# Patient Record
Sex: Male | Born: 1945
Health system: Southern US, Community
[De-identification: ages and names within clinical notes are randomized; demographics above are authoritative.]

## PROBLEM LIST (undated history)

## (undated) DIAGNOSIS — M722 Plantar fascial fibromatosis: Secondary | ICD-10-CM

## (undated) DIAGNOSIS — N201 Calculus of ureter: Secondary | ICD-10-CM

## (undated) DIAGNOSIS — Z531 Procedure and treatment not carried out because of patient's decision for reasons of belief and group pressure: Secondary | ICD-10-CM

## (undated) DIAGNOSIS — N1330 Unspecified hydronephrosis: Secondary | ICD-10-CM

## (undated) DIAGNOSIS — Z87442 Personal history of urinary calculi: Secondary | ICD-10-CM

## (undated) DIAGNOSIS — G43909 Migraine, unspecified, not intractable, without status migrainosus: Secondary | ICD-10-CM

## (undated) DIAGNOSIS — J9383 Other pneumothorax: Secondary | ICD-10-CM

## (undated) DIAGNOSIS — N134 Hydroureter: Secondary | ICD-10-CM

## (undated) DIAGNOSIS — C4440 Unspecified malignant neoplasm of skin of scalp and neck: Secondary | ICD-10-CM

## (undated) DIAGNOSIS — E781 Pure hyperglyceridemia: Secondary | ICD-10-CM

## (undated) DIAGNOSIS — K219 Gastro-esophageal reflux disease without esophagitis: Secondary | ICD-10-CM

## (undated) DIAGNOSIS — H0019 Chalazion unspecified eye, unspecified eyelid: Secondary | ICD-10-CM

## (undated) DIAGNOSIS — Z98811 Dental restoration status: Secondary | ICD-10-CM

## (undated) DIAGNOSIS — R7301 Impaired fasting glucose: Secondary | ICD-10-CM

## (undated) DIAGNOSIS — Z9889 Other specified postprocedural states: Secondary | ICD-10-CM

## (undated) DIAGNOSIS — K625 Hemorrhage of anus and rectum: Secondary | ICD-10-CM

## (undated) DIAGNOSIS — Z973 Presence of spectacles and contact lenses: Secondary | ICD-10-CM

## (undated) DIAGNOSIS — R112 Nausea with vomiting, unspecified: Secondary | ICD-10-CM

## (undated) HISTORY — DX: Other pneumothorax: J93.83

## (undated) HISTORY — DX: Pure hyperglyceridemia: E78.1

## (undated) HISTORY — DX: Impaired fasting glucose: R73.01

## (undated) HISTORY — DX: Plantar fascial fibromatosis: M72.2

## (undated) HISTORY — DX: Hemorrhage of anus and rectum: K62.5

## (undated) HISTORY — PX: CHALAZION EXCISION: SHX213

## (undated) HISTORY — DX: Unspecified malignant neoplasm of skin of scalp and neck: C44.40

## (undated) HISTORY — PX: COLONOSCOPY: SHX174

## (undated) HISTORY — PX: LUNG SURGERY: SHX703

## (undated) HISTORY — DX: Chalazion unspecified eye, unspecified eyelid: H00.19

## (undated) HISTORY — PX: TONSILLECTOMY: SUR1361

---

## 2000-03-12 DIAGNOSIS — K625 Hemorrhage of anus and rectum: Secondary | ICD-10-CM

## 2000-03-12 HISTORY — DX: Hemorrhage of anus and rectum: K62.5

## 2000-03-12 HISTORY — PX: HEMORRHOID SURGERY: SHX153

## 2001-02-25 ENCOUNTER — Encounter: Payer: Self-pay | Admitting: General Surgery

## 2001-02-27 ENCOUNTER — Ambulatory Visit (HOSPITAL_COMMUNITY): Admission: RE | Admit: 2001-02-27 | Discharge: 2001-02-27 | Payer: Self-pay | Admitting: General Surgery

## 2001-03-12 DIAGNOSIS — M722 Plantar fascial fibromatosis: Secondary | ICD-10-CM

## 2001-03-12 HISTORY — DX: Plantar fascial fibromatosis: M72.2

## 2003-03-13 HISTORY — PX: INGUINAL HERNIA REPAIR: SUR1180

## 2009-03-12 DIAGNOSIS — C4440 Unspecified malignant neoplasm of skin of scalp and neck: Secondary | ICD-10-CM

## 2009-03-12 HISTORY — DX: Unspecified malignant neoplasm of skin of scalp and neck: C44.40

## 2013-09-23 ENCOUNTER — Encounter: Payer: Self-pay | Admitting: *Deleted

## 2017-10-25 ENCOUNTER — Other Ambulatory Visit: Payer: Self-pay

## 2017-10-25 ENCOUNTER — Encounter: Payer: Self-pay | Admitting: Radiology

## 2017-10-25 ENCOUNTER — Other Ambulatory Visit: Payer: Self-pay | Admitting: Nurse Practitioner

## 2017-10-25 ENCOUNTER — Ambulatory Visit
Admission: RE | Admit: 2017-10-25 | Discharge: 2017-10-25 | Disposition: A | Discharge status: Home / Self Care | Payer: Medicare Other | Source: Ambulatory Visit | Attending: Nurse Practitioner | Admitting: Nurse Practitioner

## 2017-10-25 DIAGNOSIS — R1031 Right lower quadrant pain: Secondary | ICD-10-CM

## 2017-10-25 DIAGNOSIS — N134 Hydroureter: Secondary | ICD-10-CM

## 2017-10-25 DIAGNOSIS — N1330 Unspecified hydronephrosis: Secondary | ICD-10-CM

## 2017-10-25 DIAGNOSIS — N5089 Other specified disorders of the male genital organs: Secondary | ICD-10-CM

## 2017-10-25 DIAGNOSIS — N201 Calculus of ureter: Secondary | ICD-10-CM

## 2017-10-25 DIAGNOSIS — Z87442 Personal history of urinary calculi: Secondary | ICD-10-CM

## 2017-10-25 HISTORY — DX: Calculus of ureter: N20.1

## 2017-10-25 HISTORY — DX: Personal history of urinary calculi: Z87.442

## 2017-10-25 HISTORY — DX: Hydroureter: N13.4

## 2017-10-25 HISTORY — DX: Unspecified hydronephrosis: N13.30

## 2017-10-25 MED ORDER — IOHEXOL 300 MG/ML  SOLN
100.0000 mL | Freq: Once | INTRAMUSCULAR | Status: AC | PRN
  Administered 2017-10-25: 100 mL via INTRAVENOUS

## 2017-10-31 ENCOUNTER — Other Ambulatory Visit: Payer: Self-pay

## 2017-12-09 ENCOUNTER — Other Ambulatory Visit: Payer: Self-pay | Admitting: Urology

## 2017-12-17 ENCOUNTER — Encounter (HOSPITAL_BASED_OUTPATIENT_CLINIC_OR_DEPARTMENT_OTHER): Payer: Self-pay

## 2017-12-17 ENCOUNTER — Other Ambulatory Visit: Payer: Self-pay

## 2017-12-17 NOTE — Progress Notes (Signed)
Spoke with:  Michael Allen NPO:  After Midnight, no gum, candy, or mints   Arrival time:  1030AM Labs:  N/A AM medications: None Pre op orders:  Yes Ride home:  Michael Allen (wife) [phone] or [phone]

## 2017-12-20 ENCOUNTER — Ambulatory Visit (HOSPITAL_BASED_OUTPATIENT_CLINIC_OR_DEPARTMENT_OTHER)
Admission: RE | Admit: 2017-12-20 | Discharge: 2017-12-20 | Disposition: A | Discharge status: Home / Self Care | Payer: Medicare Other | Source: Ambulatory Visit | Attending: Urology | Admitting: Urology

## 2017-12-20 ENCOUNTER — Other Ambulatory Visit: Payer: Self-pay

## 2017-12-20 ENCOUNTER — Encounter (HOSPITAL_BASED_OUTPATIENT_CLINIC_OR_DEPARTMENT_OTHER): Payer: Self-pay | Admitting: Certified Registered Nurse Anesthetist

## 2017-12-20 ENCOUNTER — Encounter (HOSPITAL_BASED_OUTPATIENT_CLINIC_OR_DEPARTMENT_OTHER)
Admission: RE | Disposition: A | Discharge status: Home / Self Care | Payer: Self-pay | Source: Ambulatory Visit | Attending: Urology

## 2017-12-20 ENCOUNTER — Ambulatory Visit (HOSPITAL_BASED_OUTPATIENT_CLINIC_OR_DEPARTMENT_OTHER): Payer: Medicare Other | Admitting: Anesthesiology

## 2017-12-20 DIAGNOSIS — K219 Gastro-esophageal reflux disease without esophagitis: Secondary | ICD-10-CM | POA: Insufficient documentation

## 2017-12-20 DIAGNOSIS — Z91013 Allergy to seafood: Secondary | ICD-10-CM | POA: Insufficient documentation

## 2017-12-20 DIAGNOSIS — C8330 Diffuse large B-cell lymphoma, unspecified site: Secondary | ICD-10-CM | POA: Diagnosis not present

## 2017-12-20 DIAGNOSIS — Z880 Allergy status to penicillin: Secondary | ICD-10-CM | POA: Insufficient documentation

## 2017-12-20 DIAGNOSIS — Z885 Allergy status to narcotic agent status: Secondary | ICD-10-CM | POA: Insufficient documentation

## 2017-12-20 DIAGNOSIS — N509 Disorder of male genital organs, unspecified: Secondary | ICD-10-CM | POA: Diagnosis present

## 2017-12-20 DIAGNOSIS — Z87891 Personal history of nicotine dependence: Secondary | ICD-10-CM | POA: Insufficient documentation

## 2017-12-20 DIAGNOSIS — Z85828 Personal history of other malignant neoplasm of skin: Secondary | ICD-10-CM | POA: Insufficient documentation

## 2017-12-20 HISTORY — DX: Other specified postprocedural states: Z98.890

## 2017-12-20 HISTORY — DX: Procedure and treatment not carried out because of patient's decision for reasons of belief and group pressure: Z53.1

## 2017-12-20 HISTORY — DX: Dental restoration status: Z98.811

## 2017-12-20 HISTORY — DX: Calculus of ureter: N20.1

## 2017-12-20 HISTORY — DX: Gastro-esophageal reflux disease without esophagitis: K21.9

## 2017-12-20 HISTORY — DX: Migraine, unspecified, not intractable, without status migrainosus: G43.909

## 2017-12-20 HISTORY — DX: Hydroureter: N13.4

## 2017-12-20 HISTORY — DX: Nausea with vomiting, unspecified: R11.2

## 2017-12-20 HISTORY — DX: Unspecified hydronephrosis: N13.30

## 2017-12-20 HISTORY — DX: Presence of spectacles and contact lenses: Z97.3

## 2017-12-20 HISTORY — DX: Personal history of urinary calculi: Z87.442

## 2017-12-20 HISTORY — PX: ORCHIECTOMY: SHX2116

## 2017-12-20 SURGERY — ORCHIECTOMY
Anesthesia: General | Site: Groin | Laterality: Left

## 2017-12-20 MED ORDER — LACTATED RINGERS IV SOLN
INTRAVENOUS | Status: DC
  Administered 2017-12-20: 1000 mL via INTRAVENOUS
  Filled 2017-12-20: qty 1000

## 2017-12-20 MED ORDER — FENTANYL CITRATE (PF) 100 MCG/2ML IJ SOLN
INTRAMUSCULAR | Status: AC
  Filled 2017-12-20: qty 2

## 2017-12-20 MED ORDER — DEXAMETHASONE SODIUM PHOSPHATE 10 MG/ML IJ SOLN
INTRAMUSCULAR | Status: DC | PRN
  Administered 2017-12-20: 10 mg via INTRAVENOUS

## 2017-12-20 MED ORDER — MIDAZOLAM HCL 2 MG/2ML IJ SOLN
INTRAMUSCULAR | Status: AC
  Filled 2017-12-20: qty 2

## 2017-12-20 MED ORDER — PROPOFOL 10 MG/ML IV BOLUS
INTRAVENOUS | Status: DC | PRN
  Administered 2017-12-20: 20 mg via INTRAVENOUS
  Administered 2017-12-20: 120 mg via INTRAVENOUS

## 2017-12-20 MED ORDER — PROPOFOL 500 MG/50ML IV EMUL
INTRAVENOUS | Status: DC | PRN
  Administered 2017-12-20: 100 ug/kg/min via INTRAVENOUS

## 2017-12-20 MED ORDER — MEPERIDINE HCL 25 MG/ML IJ SOLN
6.2500 mg | INTRAMUSCULAR | Status: DC | PRN
  Filled 2017-12-20: qty 1

## 2017-12-20 MED ORDER — FENTANYL CITRATE (PF) 100 MCG/2ML IJ SOLN
25.0000 ug | INTRAMUSCULAR | Status: DC | PRN
  Filled 2017-12-20: qty 1

## 2017-12-20 MED ORDER — PHENYLEPHRINE 40 MCG/ML (10ML) SYRINGE FOR IV PUSH (FOR BLOOD PRESSURE SUPPORT)
PREFILLED_SYRINGE | INTRAVENOUS | Status: DC | PRN
  Administered 2017-12-20 (×3): 80 ug via INTRAVENOUS

## 2017-12-20 MED ORDER — ONDANSETRON HCL 4 MG/2ML IJ SOLN
INTRAMUSCULAR | Status: DC | PRN
  Administered 2017-12-20: 4 mg via INTRAVENOUS

## 2017-12-20 MED ORDER — CIPROFLOXACIN IN D5W 400 MG/200ML IV SOLN
INTRAVENOUS | Status: AC
  Filled 2017-12-20: qty 200

## 2017-12-20 MED ORDER — BUPIVACAINE HCL (PF) 0.25 % IJ SOLN
INTRAMUSCULAR | Status: DC | PRN
  Administered 2017-12-20: 10 mL

## 2017-12-20 MED ORDER — LIDOCAINE HCL (CARDIAC) PF 100 MG/5ML IV SOSY
PREFILLED_SYRINGE | INTRAVENOUS | Status: DC | PRN
  Administered 2017-12-20: 60 mg via INTRAVENOUS

## 2017-12-20 MED ORDER — PROPOFOL 500 MG/50ML IV EMUL
INTRAVENOUS | Status: AC
  Filled 2017-12-20: qty 50

## 2017-12-20 MED ORDER — CIPROFLOXACIN IN D5W 400 MG/200ML IV SOLN
400.0000 mg | INTRAVENOUS | Status: AC
  Administered 2017-12-20: 400 mg via INTRAVENOUS
  Filled 2017-12-20: qty 200

## 2017-12-20 MED ORDER — LIDOCAINE 2% (20 MG/ML) 5 ML SYRINGE
INTRAMUSCULAR | Status: AC
  Filled 2017-12-20: qty 5

## 2017-12-20 MED ORDER — ONDANSETRON HCL 4 MG/2ML IJ SOLN
INTRAMUSCULAR | Status: AC
  Filled 2017-12-20: qty 2

## 2017-12-20 MED ORDER — DEXAMETHASONE SODIUM PHOSPHATE 10 MG/ML IJ SOLN
INTRAMUSCULAR | Status: AC
  Filled 2017-12-20: qty 1

## 2017-12-20 MED ORDER — TRAMADOL HCL 50 MG PO TABS: 50.0000 mg | ORAL_TABLET | Freq: Four times a day (QID) | ORAL | 0 refills | Status: DC | PRN

## 2017-12-20 MED ORDER — FENTANYL CITRATE (PF) 100 MCG/2ML IJ SOLN
INTRAMUSCULAR | Status: DC | PRN
  Administered 2017-12-20 (×3): 25 ug via INTRAVENOUS

## 2017-12-20 SURGICAL SUPPLY — 50 items
BLADE CLIPPER SURG (BLADE) ×3 IMPLANT
BLADE SURG 15 STRL LF DISP TIS (BLADE) ×1 IMPLANT
BLADE SURG 15 STRL SS (BLADE) ×2
BNDG GAUZE ELAST 4 BULKY (GAUZE/BANDAGES/DRESSINGS) ×3 IMPLANT
COVER BACK TABLE 60X90IN (DRAPES) ×3 IMPLANT
COVER MAYO STAND STRL (DRAPES) ×3 IMPLANT
DERMABOND ADVANCED (GAUZE/BANDAGES/DRESSINGS) ×2
DERMABOND ADVANCED .7 DNX12 (GAUZE/BANDAGES/DRESSINGS) ×1 IMPLANT
DISSECTOR ROUND CHERRY 3/8 STR (MISCELLANEOUS) IMPLANT
DRAIN PENROSE 18X1/4 LTX STRL (WOUND CARE) ×3 IMPLANT
DRAPE LAPAROTOMY 100X72 PEDS (DRAPES) ×3 IMPLANT
ELECT REM PT RETURN 9FT ADLT (ELECTROSURGICAL) ×3
ELECTRODE REM PT RTRN 9FT ADLT (ELECTROSURGICAL) ×1 IMPLANT
GAUZE SPONGE 4X4 12PLY STRL (GAUZE/BANDAGES/DRESSINGS) ×3 IMPLANT
GLOVE BIO SURGEON STRL SZ8 (GLOVE) ×3 IMPLANT
GLOVE BIOGEL PI IND STRL 7.5 (GLOVE) ×1 IMPLANT
GLOVE BIOGEL PI IND STRL 8.5 (GLOVE) ×1 IMPLANT
GLOVE BIOGEL PI INDICATOR 7.5 (GLOVE) ×2
GLOVE BIOGEL PI INDICATOR 8.5 (GLOVE) ×2
GLOVE ECLIPSE 7.0 STRL STRAW (GLOVE) ×3 IMPLANT
GLOVE INDICATOR 8.0 STRL GRN (GLOVE) ×3 IMPLANT
GOWN STRL REUS W/ TWL LRG LVL3 (GOWN DISPOSABLE) ×1 IMPLANT
GOWN STRL REUS W/TWL LRG LVL3 (GOWN DISPOSABLE) ×2
GOWN STRL REUS W/TWL XL LVL3 (GOWN DISPOSABLE) ×6 IMPLANT
KIT TURNOVER CYSTO (KITS) ×3 IMPLANT
NEEDLE HYPO 25X1 1.5 SAFETY (NEEDLE) ×3 IMPLANT
NS IRRIG 500ML POUR BTL (IV SOLUTION) ×3 IMPLANT
PACK BASIN DAY SURGERY FS (CUSTOM PROCEDURE TRAY) ×3 IMPLANT
PENCIL BUTTON HOLSTER BLD 10FT (ELECTRODE) ×3 IMPLANT
SET COLLECT BLD 21X.75 12 PB G (NEEDLE) IMPLANT
SUPPORT SCROTAL LG STRP (MISCELLANEOUS) ×2 IMPLANT
SUPPORT SCROTAL MED ADLT STRP (MISCELLANEOUS) IMPLANT
SUPPORTER ATHLETIC LG (MISCELLANEOUS) ×1
SUPPORTER ATHLETIC MED (MISCELLANEOUS)
SUT MNCRL AB 4-0 PS2 18 (SUTURE) ×3 IMPLANT
SUT PROLENE 4 0 RB 1 (SUTURE)
SUT PROLENE 4-0 RB1 .5 CRCL 36 (SUTURE) IMPLANT
SUT SILK 0 TIES 10X30 (SUTURE) ×3 IMPLANT
SUT VIC AB 3-0 SH 27 (SUTURE) ×2
SUT VIC AB 3-0 SH 27X BRD (SUTURE) ×1 IMPLANT
SUT VIC AB 4-0 SH 27 (SUTURE)
SUT VIC AB 4-0 SH 27XANBCTRL (SUTURE) IMPLANT
SYR BULB IRRIGATION 50ML (SYRINGE) ×3 IMPLANT
SYR CONTROL 10ML LL (SYRINGE) ×3 IMPLANT
TOWEL OR 17X24 6PK STRL BLUE (TOWEL DISPOSABLE) ×6 IMPLANT
TRAY DSU PREP LF (CUSTOM PROCEDURE TRAY) ×3 IMPLANT
TUBE CONNECTING 12'X1/4 (SUCTIONS) ×1
TUBE CONNECTING 12X1/4 (SUCTIONS) ×2 IMPLANT
WATER STERILE IRR 500ML POUR (IV SOLUTION) IMPLANT
YANKAUER SUCT BULB TIP NO VENT (SUCTIONS) ×3 IMPLANT

## 2017-12-20 NOTE — Op Note (Signed)
Preoperative diagnosis: Left testicular mass  Postoperative diagnosis: Same  Principal procedure: Left inguinal orchiectomy  Surgeon: Breeanna Galgano  Anesthesia: General with LMA  Complications: None  Specimen: Left testicle and cord, to pathology  Estimated blood loss: Less than 10 cc  Drains: None  Indications: 72 year old male recently presenting with several urologic issues, noted to have a left testicular mass.  This was verified by scrotal ultrasound.  The patient has had serum markers drawn all of which were negative.  I have recommended we proceed with left orchiectomy.  The procedure as well as risks and complications have been discussed with the patient.  He understands these and desires to proceed.  Description of procedure: The patient was properly identified and marked in the holding area.  He received preoperative IV antibiotics.  Was taken to the operating room where general anesthetic was administered with the LMA.  He is placed in the supine position.  Genitalia and perineum as well as lower abdomen were prepped and draped.  Proper timeout was performed.  3 cm incision was carried out directly over the left inguinal ring in an oblique fashion.  Dissection was carried down to the spermatic cord with combined blunt and sharp dissection.  The cord was identified at the external inguinal ring.  It was carefully dissected circumferentially.  Penrose drain was placed around this and tourniquet fashion.  I then dissected down along the cord and with pressure on the left hemiscrotum pushed the left testicle up into the wound.  Gubernaculum was carefully divided with electrocautery.  There were several large vessels in this area due to hypervascularity of the left testicle.  The dissection was then carried up the spermatic cord.  The cord was clamped just distal to the external inguinal ring.  The cord was then divided in the testicle and cord were passed to the table.  I then doubly ligated  the cord stump with 0 silk ties.  The stump was then infiltrated with approximately 5 cc of quarter percent plain Marcaine.  The remaining Marcaine and the syringe, another 5 cc, was used to infiltrate the subcutaneous tissues/skin.  I then everted the scrotum into the inguinal wound.  No bleeding was seen.  Careful inspection was made of the operative site, with no bleeding.  I then closed the subcutaneous tissue with a running 3-0 Vicryl placed in simple fashion.  Skin edges were reapproximated using 4-0 Monocryl placed in a running subcuticular fashion.  Dermabond was placed on the skin.  Fluffs and a jockstrap were then placed.  At this point, the procedure was terminated.  The patient was then awakened and taken to the PACU in stable condition, having tolerated the procedure well.

## 2017-12-20 NOTE — Interval H&P Note (Signed)
History and Physical Interval Note:  12/20/2017 12:28 PM  Michael Allen  has presented today for surgery, with the diagnosis of LEFT TESTICULAR MASS  The various methods of treatment have been discussed with the patient and family. After consideration of risks, benefits and other options for treatment, the patient has consented to  Procedure(s): ORCHIECTOMY (Left) as a surgical intervention .  The patient's history has been reviewed, patient examined, no change in status, stable for surgery.  I have reviewed the patient's chart and labs.  Questions were answered to the patient's satisfaction.     Lillette Boxer Mackenze Grandison

## 2017-12-20 NOTE — Anesthesia Procedure Notes (Signed)
Procedure Name: LMA Insertion Date/Time: 12/20/2017 12:39 PM Performed by: Raenette Rover, CRNA Pre-anesthesia Checklist: Patient identified, Emergency Drugs available, Suction available and Patient being monitored Patient Re-evaluated:Patient Re-evaluated prior to induction Oxygen Delivery Method: Circle system utilized Preoxygenation: Pre-oxygenation with 100% oxygen Induction Type: IV induction Ventilation: Mask ventilation without difficulty LMA: LMA inserted LMA Size: 4.0 Number of attempts: 1 Placement Confirmation: positive ETCO2,  CO2 detector and breath sounds checked- equal and bilateral Tube secured with: Tape Dental Injury: Teeth and Oropharynx as per pre-operative assessment

## 2017-12-20 NOTE — Transfer of Care (Signed)
Immediate Anesthesia Transfer of Care Note  Patient: Michael Allen  Procedure(s) Performed: ORCHIECTOMY (Left Groin)  Patient Location: PACU  Anesthesia Type:General  Level of Consciousness: awake, drowsy and patient cooperative  Airway & Oxygen Therapy: Patient Spontanous Breathing and Patient connected to nasal cannula oxygen  Post-op Assessment: Report given to RN and Post -op Vital signs reviewed and stable  Post vital signs: Reviewed and stable  Last Vitals:  Vitals Value Taken Time  BP 101/65 12/20/2017  1:19 PM  Temp    Pulse 60 12/20/2017  1:22 PM  Resp 1 12/20/2017  1:22 PM  SpO2 100 % 12/20/2017  1:22 PM  Vitals shown include unvalidated device data.  Last Pain:  Vitals:   12/20/17 1042  TempSrc:   PainSc: 0-No pain      Patients Stated Pain Goal: 4 (01/59/96 8957)  Complications: No apparent anesthesia complications

## 2017-12-20 NOTE — Anesthesia Postprocedure Evaluation (Signed)
Anesthesia Post Note  Patient: Raequon Catanzaro  Procedure(s) Performed: ORCHIECTOMY (Left Groin)     Patient location during evaluation: PACU Anesthesia Type: General Level of consciousness: awake and alert Pain management: pain level controlled Vital Signs Assessment: post-procedure vital signs reviewed and stable Respiratory status: spontaneous breathing, nonlabored ventilation, respiratory function stable and patient connected to nasal cannula oxygen Cardiovascular status: blood pressure returned to baseline and stable Postop Assessment: no apparent nausea or vomiting Anesthetic complications: no    Last Vitals:  Vitals:   12/20/17 1025 12/20/17 1319  BP: 118/70 101/65  Pulse: 73 66  Resp: 16 14  Temp: (!) 36.4 C (!) 36.3 C  SpO2: 100% 100%    Last Pain:  Vitals:   12/20/17 1319  TempSrc:   PainSc: 0-No pain                 Elfriede Bonini

## 2017-12-20 NOTE — H&P (Signed)
H&P  Chief Complaint: Left testicular mass  History of Present Illness:  72 year old male, Je[employer]'s Witness, presents this time for left radical orchiectomy.  He presented with several urologic issues recently, and was noted to have swelling of his left hemiscrotum consistent with hydrocele as well as a left testicular mass.  Serum markers were drawn and were all negative.  He presents at this time for left radical orchiectomy.  Past Medical History:  Diagnosis Date  . Chalazion   . Dental crowns present   . GERD (gastroesophageal reflux disease)   . History of kidney stones 10/25/2017   right nonobstructing stone noted on CT ABD/Pelvis  . Hydronephrosis 10/25/2017   Mild to moderate right due to 38mm stone right distal, noted on CT abd/pelvis  . Hydroureter, right 10/25/2017   41mm stone right distal, noted on CT abd/pelvis  . Impaired fasting glucose   . Migraine    optical  . Plantar fasciitis 2003  . PONV (postoperative nausea and vomiting)    light  . Pure hyperglyceridemia   . Rectal bleeding 2002  . Recurrent spontaneous pneumothorax    due to blebsx2  . Skin cancer of scalp 2011  . Transfusion of blood product refused for religious reason   . Ureteral stone 10/25/2017   1mm stone right distal, noted on CT abd/pelvis  . Wears glasses     Past Surgical History:  Procedure Laterality Date  . CHALAZION EXCISION Right    cyst removal , right eye  . COLONOSCOPY    . [place]  2002  . INGUINAL HERNIA REPAIR Bilateral 2005   wire mesh  . LUNG SURGERY     x2  . TONSILLECTOMY      Home Medications:  Allergies as of 12/20/2017      Reactions   Oxycodone    Penicillins    Shellfish Allergy Hives   Shell fish and shrimp      Medication List    Notice   Cannot display discharge medications because the patient has not yet been admitted.     Allergies:  Allergies  Allergen Reactions  . Oxycodone   . Penicillins   . Shellfish Allergy Hives    Shell  fish and shrimp    Family History  Problem Relation Age of Onset  . Dementia Mother        progressive  . Breast cancer Mother   . Heart Problems Father        pacemaker  . CAD Father   . Heart disease Father     Social History:  reports that he has quit smoking. He has never used smokeless tobacco. He reports that he drinks alcohol. He reports that he does not use drugs.  ROS: A complete review of systems was performed.  All systems are negative except for pertinent findings as noted.  Physical Exam:  Vital signs in last 24 hours:   Constitutional:  Alert and oriented, No acute distress Cardiovascular: Regular rate  Respiratory: Normal respiratory effort GI: Abdomen is soft, nontender, nondistended, no abdominal masses Genitourinary: No CVAT. Normal male phallus, testes are descended bilaterally  , there is a left testicular mass noted. Lymphatic: No lymphadenopathy Neurologic: Grossly intact, no focal deficits Psychiatric: Normal mood and affect  Laboratory Data:  No results for input(s): WBC, HGB, HCT, PLT in the last 72 hours.  No results for input(s): NA, K, CL, GLUCOSE, BUN, CALCIUM, CREATININE in the last 72 hours.  Invalid input(s): CO3   No  results found for this or any previous visit (from the past 24 hour(s)). No results found for this or any previous visit (from the past 240 hour(s)).  Renal Function: No results for input(s): CREATININE in the last 168 hours. CrCl cannot be calculated (No successful lab value found.).  Radiologic Imaging: No results found.  Impression/Assessment:    Left testicular mass  Plan:   left radical orchiectomy

## 2017-12-20 NOTE — Anesthesia Preprocedure Evaluation (Addendum)
Anesthesia Evaluation  Patient identified by MRN, date of birth, ID band Patient awake    Reviewed: Allergy & Precautions, H&P , NPO status , Patient's Chart, lab work & pertinent test results, reviewed documented beta blocker date and time   Airway Mallampati: II  TM Distance: >3 FB Neck ROM: full    Dental no notable dental hx.    Pulmonary neg pulmonary ROS, former smoker,    Pulmonary exam normal breath sounds clear to auscultation       Cardiovascular Exercise Tolerance: Good negative cardio ROS   Rhythm:regular Rate:Normal     Neuro/Psych negative neurological ROS  negative psych ROS   GI/Hepatic negative GI ROS, Neg liver ROS,   Endo/Other  negative endocrine ROS  Renal/GU negative Renal ROS  negative genitourinary   Musculoskeletal   Abdominal   Peds  Hematology negative hematology ROS (+)   Anesthesia Other Findings   Reproductive/Obstetrics negative OB ROS                             Anesthesia Physical Anesthesia Plan  ASA: II  Anesthesia Plan: General   Post-op Pain Management:    Induction:   PONV Risk Score and Plan:   Airway Management Planned:   Additional Equipment:   Intra-op Plan:   Post-operative Plan:   Informed Consent: I have reviewed the patients History and Physical, chart, labs and discussed the procedure including the risks, benefits and alternatives for the proposed anesthesia with the patient or authorized representative who has indicated his/her understanding and acceptance.   Dental Advisory Given  Plan Discussed with: CRNA  Anesthesia Plan Comments:         Anesthesia Quick Evaluation

## 2017-12-20 NOTE — Discharge Instructions (Signed)
HOME CARE INSTRUCTIONS FOR SCROTAL PROCEDURES  Wound Care & Hygiene: You may apply an ice bag to the scrotum for the first 24 hours.  This may help decrease swelling and soreness.  You may have a dressing held in place by an athletic supporter.  You may remove the dressing in 24 hours and shower in 48 hours.  Continue to use the athletic supporter or tight briefs for at least a week. Activity: Rest today - not necessarily flat bed rest.  Just take it easy.  You should not do strenuous activities until your follow-up visit with your doctor.  You may resume light activity in 48 hours.  Return to Work:  Your doctor will advise you of this depending on the type of work you do  Diet: Drink liquids or eat a light diet this evening.  You may resume a regular diet tomorrow.  General Expectations: You may have a small amount of bleeding.  The scrotum may be swollen or bruised for about a week.  Call your Doctor if these occur:  -persistent or heavy bleeding  -temperature of 101 degrees or more  -severe pain, not relieved by your pain medication   Post Anesthesia Home Care Instructions  Activity: Get plenty of rest for the remainder of the day. A responsible individual must stay with you for 24 hours following the procedure.  For the next 24 hours, DO NOT: -Drive a car -Paediatric nurse -Drink alcoholic beverages -Take any medication unless instructed by your physician -Make any legal decisions or sign important papers.  Meals: Start with liquid foods such as gelatin or soup. Progress to regular foods as tolerated. Avoid greasy, spicy, heavy foods. If nausea and/or vomiting occur, drink only clear liquids until the nausea and/or vomiting subsides. Call your physician if vomiting continues.  Special Instructions/Symptoms: Your throat may feel dry or sore from the anesthesia or the breathing tube placed in your throat during surgery. If this causes discomfort, gargle with warm salt water.  The discomfort should disappear within 24 hours.  If you had a scopolamine patch placed behind your ear for the management of post- operative nausea and/or vomiting:  1. The medication in the patch is effective for 72 hours, after which it should be removed.  Wrap patch in a tissue and discard in the trash. Wash hands thoroughly with soap and water. 2. You may remove the patch earlier than 72 hours if you experience unpleasant side effects which may include dry mouth, dizziness or visual disturbances. 3. Avoid touching the patch. Wash your hands with soap and water after contact with the patch.

## 2017-12-23 ENCOUNTER — Encounter (HOSPITAL_BASED_OUTPATIENT_CLINIC_OR_DEPARTMENT_OTHER): Payer: Self-pay | Admitting: Urology

## 2017-12-26 ENCOUNTER — Other Ambulatory Visit: Payer: Self-pay | Admitting: Oncology

## 2017-12-26 ENCOUNTER — Telehealth: Payer: Self-pay | Admitting: Oncology

## 2017-12-26 NOTE — Telephone Encounter (Signed)
New referral received from Dr. [name] from [facility] for Michael Allen. Spoke to the pt's wife and scheduled the pt to see Dr. Jana Hakim on 10/18 at 230pm. Aware that her husband should arrive 30 minutes early for labs at 2pm. Michael Allen has agreed to the appt date and time.

## 2017-12-27 ENCOUNTER — Inpatient Hospital Stay: Payer: Medicare Other

## 2017-12-27 ENCOUNTER — Inpatient Hospital Stay: Payer: Medicare Other | Attending: Oncology | Admitting: Oncology

## 2017-12-27 ENCOUNTER — Other Ambulatory Visit: Payer: Self-pay | Admitting: Urology

## 2017-12-27 ENCOUNTER — Other Ambulatory Visit: Payer: Self-pay

## 2017-12-27 VITALS — BP 116/63 | HR 70 | Temp 97.6°F | Resp 18 | Ht 74.0 in | Wt 180.3 lb

## 2017-12-27 DIAGNOSIS — N1330 Unspecified hydronephrosis: Secondary | ICD-10-CM

## 2017-12-27 DIAGNOSIS — C8330 Diffuse large B-cell lymphoma, unspecified site: Secondary | ICD-10-CM

## 2017-12-27 DIAGNOSIS — J8410 Pulmonary fibrosis, unspecified: Secondary | ICD-10-CM | POA: Insufficient documentation

## 2017-12-27 DIAGNOSIS — C8585 Other specified types of non-Hodgkin lymphoma, lymph nodes of inguinal region and lower limb: Secondary | ICD-10-CM | POA: Insufficient documentation

## 2017-12-27 DIAGNOSIS — C6290 Malignant neoplasm of unspecified testis, unspecified whether descended or undescended: Secondary | ICD-10-CM

## 2017-12-27 DIAGNOSIS — K5730 Diverticulosis of large intestine without perforation or abscess without bleeding: Secondary | ICD-10-CM

## 2017-12-27 DIAGNOSIS — Z23 Encounter for immunization: Secondary | ICD-10-CM | POA: Diagnosis not present

## 2017-12-27 DIAGNOSIS — N200 Calculus of kidney: Secondary | ICD-10-CM | POA: Insufficient documentation

## 2017-12-27 DIAGNOSIS — C8580 Other specified types of non-Hodgkin lymphoma, unspecified site: Secondary | ICD-10-CM

## 2017-12-27 DIAGNOSIS — I700 Atherosclerosis of aorta: Secondary | ICD-10-CM | POA: Insufficient documentation

## 2017-12-27 LAB — URIC ACID: Uric Acid, Serum: 5.1 mg/dL (ref 3.7–8.6)

## 2017-12-27 LAB — COMPREHENSIVE METABOLIC PANEL
ALBUMIN: 4.2 g/dL (ref 3.5–5.0)
ALK PHOS: 78 U/L (ref 38–126)
ALT: 13 U/L (ref 0–44)
ANION GAP: 11 (ref 5–15)
AST: 23 U/L (ref 15–41)
BUN: 15 mg/dL (ref 8–23)
CALCIUM: 9.5 mg/dL (ref 8.9–10.3)
CHLORIDE: 104 mmol/L (ref 98–111)
CO2: 26 mmol/L (ref 22–32)
Creatinine, Ser: 0.96 mg/dL (ref 0.61–1.24)
GFR calc Af Amer: >60 mL/min (ref >60)
GFR calc non Af Amer: >60 mL/min (ref >60)
GLUCOSE: 91 mg/dL (ref 70–99)
Potassium: 4.2 mmol/L (ref 3.5–5.1)
SODIUM: 141 mmol/L (ref 135–145)
Total Bilirubin: 0.8 mg/dL (ref 0.3–1.2)
Total Protein: 8 g/dL (ref 6.5–8.1)

## 2017-12-27 LAB — CBC WITH DIFFERENTIAL/PLATELET
Abs Immature Granulocytes: 0.03 10*3/uL (ref 0.00–0.07)
BASOS ABS: 0.1 10*3/uL (ref 0.0–0.1)
Basophils Relative: 1 %
EOS PCT: 3 %
Eosinophils Absolute: 0.2 10*3/uL (ref 0.0–0.5)
HCT: 47.1 % (ref 39.0–52.0)
HEMOGLOBIN: 15.5 g/dL (ref 13.0–17.0)
Immature Granulocytes: 0 %
LYMPHS PCT: 33 %
Lymphs Abs: 3 10*3/uL (ref 0.7–4.0)
MCH: 29.7 pg (ref 26.0–34.0)
MCHC: 32.9 g/dL (ref 30.0–36.0)
MCV: 90.2 fL (ref 80.0–100.0)
Monocytes Absolute: 0.9 10*3/uL (ref 0.1–1.0)
Monocytes Relative: 9 %
Neutro Abs: 4.9 10*3/uL (ref 1.7–7.7)
Neutrophils Relative %: 54 %
Platelets: 230 10*3/uL (ref 150–400)
RBC: 5.22 MIL/uL (ref 4.22–5.81)
RDW: 14.4 % (ref 11.5–15.5)
WBC: 9 10*3/uL (ref 4.0–10.5)
nRBC: 0 % (ref 0.0–0.2)

## 2017-12-27 LAB — LACTATE DEHYDROGENASE: LDH: 232 U/L — ABNORMAL HIGH (ref 98–192)

## 2017-12-27 MED ORDER — ONDANSETRON HCL 8 MG PO TABS: 8.0000 mg | ORAL_TABLET | Freq: Two times a day (BID) | ORAL | 1 refills | Status: DC | PRN

## 2017-12-27 MED ORDER — INFLUENZA VAC SPLIT QUAD 0.5 ML IM SUSY
0.5000 mL | PREFILLED_SYRINGE | Freq: Once | INTRAMUSCULAR | Status: AC
  Administered 2017-12-27: 0.5 mL via INTRAMUSCULAR

## 2017-12-27 MED ORDER — INFLUENZA VAC SPLIT QUAD 0.5 ML IM SUSY: 0.5000 mL | PREFILLED_SYRINGE | Freq: Once | INTRAMUSCULAR | Status: DC

## 2017-12-27 MED ORDER — INFLUENZA VAC SPLIT QUAD 0.5 ML IM SUSY
PREFILLED_SYRINGE | INTRAMUSCULAR | Status: AC
  Filled 2017-12-27: qty 0.5

## 2017-12-27 MED ORDER — VALACYCLOVIR HCL 500 MG PO TABS: 500.0000 mg | ORAL_TABLET | Freq: Two times a day (BID) | ORAL | 3 refills | Status: DC

## 2017-12-27 NOTE — Progress Notes (Signed)
[place]  Telephone:([phone] Fax:(336) [phone]     ID: Quade Ramirez DOB: 10-17-1945  MR#: [id number]  CZY#:[id number]  Patient Care Team: Seward Carol, MD as PCP - General (Internal Medicine) [name], MD as Consulting Physician (Urology) Magrinat, [name], MD as Consulting Physician (Oncology) [name], MD OTHER MD:  CHIEF COMPLAINT: Diffuse large cell non-Hodgkin's lymphoma, germinal center B-cell subtype  CURRENT TREATMENT: Work-up in progress   HISTORY OF CURRENT ILLNESS: The patient was referred to Dr. [name] for renal stones.  Incidentally he was noted to have left scrotal swelling.  This had been present several months and was initially felt to be a hydrocele.  Scrotal ultrasound was performed 12/06/2017.  This found no spermatocele but a solid mass in the left testis upper and lower pole.  Accordingly on 12/20/2017 the patient underwent left inguinal orchiectomy.  The pathology from this procedure (SZ [id number]) showed a diffuse large B cell non-Hodgkin's lymphoma, CD20 positive, CD10 weakly positive, no coexpression of CD5, and most consistent with a germinal center B-cell subtype.  I was called by Dr. [name] earlier today and the patient was brought in for further evaluation  His subsequent history is as detailed below.  INTERVAL HISTORY: Mr. Mcmanamon was evaluated in the cancer clinic on 12/27/2017 accompanied by [name] and his [name].   REVIEW OF SYSTEMS: The patient denies any other areas of abnormal swelling except the testicle.  He did well with his recent surgery, with minimal to no pain, no swelling, no bleeding, and no fever.  There has been no unexplained fatigue or weight loss, no rash, no drenching sweats and no fevers.  A detailed review of systems today was otherwise entirely noncontributory  PAST MEDICAL HISTORY: Past Medical History:  Diagnosis Date  . Chalazion   . Dental crowns present     . GERD (gastroesophageal reflux disease)   . History of kidney stones 10/25/2017   right nonobstructing stone noted on CT ABD/Pelvis  . Hydronephrosis 10/25/2017   Mild to moderate right due to 10m stone right distal, noted on CT abd/pelvis  . Hydroureter, right 10/25/2017   479mstone right distal, noted on CT abd/pelvis  . Impaired fasting glucose   . Migraine    optical  . Plantar fasciitis 2003  . PONV (postoperative nausea and vomiting)    light  . Pure hyperglyceridemia   . Rectal bleeding 2002  . Recurrent spontaneous pneumothorax    due to blebsx2  . Skin cancer of scalp 2011  . Transfusion of blood product refused for religious reason   . Ureteral stone 10/25/2017   [id number] right distal, noted on CT abd/pelvis  . Wears glasses     PAST SURGICAL HISTORY: Past Surgical History:  Procedure Laterality Date  . CHALAZION EXCISION Right    cyst removal , right eye  . COLONOSCOPY    . [id number]  . INGUINAL HERNIA REPAIR Bilateral 2005   wire mesh  . LUNG SURGERY     x2  . ORCHIECTOMY Left 12/20/2017   Procedure: ORCHIECTOMY;  Surgeon: DahFranchot GalloD;  [facility];  Laterality: Left;  . TONSILLECTOMY      FAMILY HISTORY Family History  Problem Relation Age of Onset  . Dementia Mother        progressive  . Breast cancer Mother   . Heart Problems Father        pacemaker  .  CAD Father   . Heart disease Father   The patient's father died from heart and lung problems in his 22s.  The patient's mother had breast cancer in her 78s, died in her 64s from Alzheimer's disease.  The patient had no brothers, 2 sisters.  One sister has what sounds like lung cancer metastatic to the brain, in her 37s.  One maternal cousin was diagnosed with Ms. to have been breast cancer metastatic to the lung in her 74s.   SOCIAL HISTORY:  The patient retired from [employer] more than 20 years ago.  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   [name] is an education major but mostly worked as a housewife.  [name] is a "mom", and sign language interpreted as well as very active in the natural path movement.  She lives in [place].  Son Nigel lives in [place] where he works for the [employer].  The patient has 2 grandchildren    ADVANCED DIRECTIVES: Please note patient would refuse a blood transfusion even for lifesaving reasons (discussed 12/27/2017   HEALTH MAINTENANCE: Social History   Tobacco Use  . Smoking status: Former Research scientist (life sciences)  . Smokeless tobacco: Never Used  . Tobacco comment: age 70  Substance Use Topics  . Alcohol use: Yes    Comment: occ beer  . Drug use: Never     Colonoscopy: 2015  PSA:  Bone density:   Allergies  Allergen Reactions  . Blood-Group Specific Substance   . Oxycodone   . Penicillins   . Shellfish Allergy Hives    Shell fish and shrimp    No current outpatient medications on file.   No current facility-administered medications for this visit.    Facility-Administered Medications Ordered in Other Visits  Medication Dose Route Frequency Provider Last Rate Last Dose  . Influenza vac split quadrivalent PF (FLUARIX) injection 0.5 mL  0.5 mL Intramuscular Once Domenique Southers, [name], MD        OBJECTIVE: Middle-aged white man in no acute distress  Vitals:   12/27/17 1440  BP: 116/63  Pulse: 70  Resp: 18  Temp: 97.6 F (36.4 C)  SpO2: 99%     Body mass index is 23.15 kg/m.   Wt Readings from Last 3 Encounters:  12/27/17 180 lb 4.8 oz (81.8 kg)  12/20/17 180 lb 12.8 oz (82 kg)      ECOG FS:0 - Asymptomatic  Ocular: Sclerae unicteric, pupils round and equal Lymphatic: No cervical or supraclavicular adenopathy, no axillary or inguinal adenopathy Lungs no rales or rhonchi Heart regular rate and rhythm Abd soft, nontender, positive bowel sounds Pelvis: Left inguinal incision is healing nicely, without dehiscence, tenderness, or  erythema.  There is a small ecchymosis. MSK no focal spinal tenderness, no joint edema Neuro: non-focal, well-oriented, appropriate affect   LAB RESULTS:  CMP     Component Value Date/Time   NA 141 12/27/2017 1426   K 4.2 12/27/2017 1426   CL 104 12/27/2017 1426   CO2 26 12/27/2017 1426   GLUCOSE 91 12/27/2017 1426   BUN 15 12/27/2017 1426   CREATININE 0.96 12/27/2017 1426   CALCIUM 9.5 12/27/2017 1426   PROT 8.0 12/27/2017 1426   ALBUMIN 4.2 12/27/2017 1426   AST 23 12/27/2017 1426   ALT 13 12/27/2017 1426   ALKPHOS 78 12/27/2017 1426   BILITOT 0.8 12/27/2017 1426   GFRNONAA >60 12/27/2017 1426   GFRAA >60 12/27/2017 1426    No results  found for: TOTALPROTELP, ALBUMINELP, A1GS, A2GS, BETS, BETA2SER, GAMS, MSPIKE, SPEI  No results found for: Nils Pyle, [facility]  Lab Results  Component Value Date   WBC 9.0 12/27/2017   NEUTROABS 4.9 12/27/2017   HGB 15.5 12/27/2017   HCT 47.1 12/27/2017   MCV 90.2 12/27/2017   PLT 230 12/27/2017      Chemistry      Component Value Date/Time   NA 141 12/27/2017 1426   K 4.2 12/27/2017 1426   CL 104 12/27/2017 1426   CO2 26 12/27/2017 1426   BUN 15 12/27/2017 1426   CREATININE 0.96 12/27/2017 1426      Component Value Date/Time   CALCIUM 9.5 12/27/2017 1426   ALKPHOS 78 12/27/2017 1426   AST 23 12/27/2017 1426   ALT 13 12/27/2017 1426   BILITOT 0.8 12/27/2017 1426       No results found for: LABCA2  No components found for: XVQMGQ676  No results for input(s): INR in the last 168 hours.  No results found for: LABCA2  No results found for: PPJ093  No results found for: OIZ124  No results found for: PYK998  No results found for: CA2729  No components found for: HGQUANT  No results found for: CEA1 / No results found for: CEA1   No results found for: AFPTUMOR  No results found for: [place]  No results found for: PSA1  Appointment on 12/27/2017  Component Date Value Ref Range Status    . WBC 12/27/2017 9.0  4.0 - 10.5 K/uL Final  . RBC 12/27/2017 5.22  4.22 - 5.81 MIL/uL Final  . Hemoglobin 12/27/2017 15.5  13.0 - 17.0 g/dL Final  . HCT 12/27/2017 47.1  39.0 - 52.0 % Final  . MCV 12/27/2017 90.2  80.0 - 100.0 fL Final  . MCH 12/27/2017 29.7  26.0 - 34.0 pg Final  . MCHC 12/27/2017 32.9  30.0 - 36.0 g/dL Final  . RDW 12/27/2017 14.4  11.5 - 15.5 % Final  . Platelets 12/27/2017 230  150 - 400 K/uL Final  . nRBC 12/27/2017 0.0  0.0 - 0.2 % Final  . Neutrophils Relative % 12/27/2017 54  % Final  . Neutro Abs 12/27/2017 4.9  1.7 - 7.7 K/uL Final  . Lymphocytes Relative 12/27/2017 33  % Final  . Lymphs Abs 12/27/2017 3.0  0.7 - 4.0 K/uL Final  . Monocytes Relative 12/27/2017 9  % Final  . Monocytes Absolute 12/27/2017 0.9  0.1 - 1.0 K/uL Final  . Eosinophils Relative 12/27/2017 3  % Final  . Eosinophils Absolute 12/27/2017 0.2  0.0 - 0.5 K/uL Final  . Basophils Relative 12/27/2017 1  % Final  . Basophils Absolute 12/27/2017 0.1  0.0 - 0.1 K/uL Final  . Immature Granulocytes 12/27/2017 0  % Final  . Abs Immature Granulocytes 12/27/2017 0.03  0.00 - 0.07 K/uL Final   Performed at [facility], [place] [address], [place], [place] [address]  . Sodium 12/27/2017 141  135 - 145 mmol/L Final  . Potassium 12/27/2017 4.2  3.5 - 5.1 mmol/L Final  . Chloride 12/27/2017 104  98 - 111 mmol/L Final  . CO2 12/27/2017 26  22 - 32 mmol/L Final  . Glucose, Bld 12/27/2017 91  70 - 99 mg/dL Final  . BUN 12/27/2017 15  8 - 23 mg/dL Final  . Creatinine, Ser 12/27/2017 0.96  0.61 - 1.24 mg/dL Final  . Calcium 12/27/2017 9.5  8.9 - 10.3 mg/dL Final  . Total Protein  12/27/2017 8.0  6.5 - 8.1 g/dL Final  . Albumin 12/27/2017 4.2  3.5 - 5.0 g/dL Final  . AST 12/27/2017 23  15 - 41 U/L Final  . ALT 12/27/2017 13  0 - 44 U/L Final  . Alkaline Phosphatase 12/27/2017 78  38 - 126 U/L Final  . Total Bilirubin 12/27/2017 0.8  0.3 - 1.2 mg/dL Final  . GFR calc non Af Amer  12/27/2017 >60  >60 mL/min Final  . GFR calc Af Amer 12/27/2017 >60  >60 mL/min Final   Comment: (NOTE) The eGFR has been calculated using the CKD EPI equation. This calculation has not been validated in all clinical situations. eGFR's persistently <60 mL/min signify possible Chronic Kidney Disease.   Georgiann Hahn gap 12/27/2017 11  5 - 15 Final   Performed at [facility], [place] [address], [place], [place] [address]  . Uric Acid, Serum 12/27/2017 5.1  3.7 - 8.6 mg/dL Final   Performed at [facility], [place] [address], [place], [place] [address]  . LDH 12/27/2017 232* 98 - 192 U/L Final   Performed at [facility], [place] [address] [place], [place] [address]    (this displays the last labs from the last 3 days)  No results found for: TOTALPROTELP, ALBUMINELP, A1GS, A2GS, BETS, BETA2SER, GAMS, MSPIKE, SPEI (this displays SPEP labs)  No results found for: KPAFRELGTCHN, LAMBDASER, KAPLAMBRATIO (kappa/lambda light chains)  No results found for: HGBA, HGBA2QUANT, HGBFQUANT, HGBSQUAN (Hemoglobinopathy evaluation)   Lab Results  Component Value Date   LDH 232 (H) 12/27/2017    No results found for: IRON, TIBC, IRONPCTSAT (Iron and TIBC)  No results found for: FERRITIN  Urinalysis No results found for: COLORURINE, APPEARANCEUR, LABSPEC, PHURINE, GLUCOSEU, HGBUR, BILIRUBINUR, KETONESUR, PROTEINUR, UROBILINOGEN, NITRITE, LEUKOCYTESUR   STUDIES: Pathology results reviewed with the patient  ELIGIBLE FOR AVAILABLE RESEARCH PROTOCOL: no  ASSESSMENT: 72 y.o. Jehovah's Witness status post left inguinal orchiectomy 12/20/2017 for a diffuse large cell non-Hodgkin's lymphoma, germinal center B-cell subtype, CD20 positive, CD5 negative.  (1) staging studies pending:  (a) PET scan  (b) bone marrow biopsy  (c) lumbar puncture  (2) international prognostic index:  (3) cyclophosphamide, doxorubicin, vincristine,  prednisone, and rituximab to start 01/16/2018  (4) adjuvant radiation as appropriate  PLAN: I spent approximately 60 minutes face to face with Jenny Reichmann with more than 50% of that time spent in counseling and coordination of care. Specifically we reviewed the biology of the patient's diagnosis and the specifics of her situation.  We first reviewed the fact that cancer is not one disease but more than 200 different diseases and that it is important to keep them separate-- otherwise when friends and relatives discuss their own cancer experiences with Undray confusion can result.  This should help clarify the family history of cancer, which includes relatives with "lung and brain cancer" and breast and lung cancer".  We then discussed carcinomas versus sarcomas versus white cell cancers, and in the white cell cancers the difference between lymphomas and leukemias.  We then discussed the lymph system so that the patient understands there are drainage channels everywhere in the body and there are lymph nodes along all those channels.  The exceptions are the brain and testicles.  These are "sanctuary sites".  When lymphoma involves the testicles there is a concern that involvement of the central nervous system also may occur.  We discussed the fact that lymphomas may be T-cell or B cells, that  B-cell lymphomas may be Hodgkin's or non-Hodgkin's, and that non-Hodgkin's lymphomas may be indolent, aggressive, or extremely aggressive.  This is in the aggressive group.  These tumors can be cured but they do require intensive treatment.  The standard of care for diffuse large cell B-cell non-Hodgkin's lymphoma includes CHOP chemotherapy and rituximab.  If the disease appears to be limited, then 3 cycles of CHOP/rightuximab will be followed by radiation.  If there is other evidence of disease we will proceed to 6-8 cycles of CHOP rightuximab.  Discussed the possible toxicity side effects and complications of the various  agents.  The patient is planning to travel to [place] between November 20 and November 30.  If we start his treatment 01/16/2018 he should be able to go on the trip and then he will receive his second cycle of chemotherapy 02/10/2018.  Finally we discussed his staging studies which will include a bone marrow biopsy, lumbar puncture and PET scan.  He will also be scheduled for an echocardiogram.  Junaid has a good understanding of the overall plan.  He agrees with it.  He knows the goal of treatment in her case is cure.  He will call with any problems that may develop before her next visit here.  [name], MD   12/27/2017 5:49 PM Medical Oncology and Hematology [facility] [address] [place], [place] [address] Tel. [phone]    Fax. [phone]

## 2017-12-27 NOTE — Progress Notes (Signed)
START ON PATHWAY REGIMEN - Lymphoma and CLL     A cycle is every 21 days:     Prednisone      Rituximab      Cyclophosphamide      Doxorubicin      Vincristine   **Always confirm dose/schedule in your pharmacy ordering system**  Patient Characteristics: Diffuse Large B-Cell Lymphoma, First Line, Stage III and IV Disease Type: Not Applicable Disease Type: Diffuse Large B-Cell Lymphoma Disease Type: Not Applicable Line of therapy: First Line [place] Stage: III Intent of Therapy: Curative Intent, Discussed with Patient 

## 2017-12-28 LAB — HEPATITIS B SURFACE ANTIGEN: HEP B S AG: NEGATIVE

## 2017-12-28 LAB — HIV ANTIBODY (ROUTINE TESTING W REFLEX): HIV Screen 4th Generation wRfx: NONREACTIVE

## 2017-12-28 LAB — HEPATITIS C ANTIBODY: HCV Ab: <0.1 s/co ratio (ref 0.0–0.9)

## 2017-12-28 LAB — HEPATITIS B CORE ANTIBODY, IGM: HEP B C IGM: NEGATIVE

## 2017-12-28 LAB — BETA 2 MICROGLOBULIN, SERUM: Beta-2 Microglobulin: 1.4 mg/L (ref 0.6–2.4)

## 2017-12-30 ENCOUNTER — Telehealth: Payer: Self-pay | Admitting: Oncology

## 2017-12-30 ENCOUNTER — Other Ambulatory Visit: Payer: Self-pay | Admitting: *Deleted

## 2017-12-30 ENCOUNTER — Telehealth: Payer: Self-pay | Admitting: *Deleted

## 2017-12-30 NOTE — Telephone Encounter (Signed)
TCT pt's home  And spoke with wife to verify that they both are aware of his appts this week.  Wife states they are aware of his upcoming appts.

## 2017-12-30 NOTE — Telephone Encounter (Signed)
Pt sched 11/8 due to avail in treatment area. Gave pt avs and calendar.

## 2017-12-31 ENCOUNTER — Ambulatory Visit (HOSPITAL_COMMUNITY)
Admission: RE | Admit: 2017-12-31 | Discharge: 2017-12-31 | Disposition: A | Discharge status: Home / Self Care | Payer: Medicare Other | Source: Ambulatory Visit | Attending: Urology | Admitting: Urology

## 2017-12-31 DIAGNOSIS — I2510 Atherosclerotic heart disease of native coronary artery without angina pectoris: Secondary | ICD-10-CM | POA: Insufficient documentation

## 2017-12-31 DIAGNOSIS — I700 Atherosclerosis of aorta: Secondary | ICD-10-CM | POA: Insufficient documentation

## 2017-12-31 DIAGNOSIS — K639 Disease of intestine, unspecified: Secondary | ICD-10-CM | POA: Insufficient documentation

## 2017-12-31 DIAGNOSIS — C6290 Malignant neoplasm of unspecified testis, unspecified whether descended or undescended: Secondary | ICD-10-CM

## 2017-12-31 DIAGNOSIS — N200 Calculus of kidney: Secondary | ICD-10-CM | POA: Diagnosis not present

## 2017-12-31 LAB — GLUCOSE, CAPILLARY: GLUCOSE-CAPILLARY: 102 mg/dL — AB (ref 70–99)

## 2017-12-31 MED ORDER — FLUDEOXYGLUCOSE F - 18 (FDG) INJECTION
9.0000 | Freq: Once | INTRAVENOUS | Status: AC | PRN
  Administered 2017-12-31: 9 via INTRAVENOUS

## 2018-01-01 ENCOUNTER — Ambulatory Visit (HOSPITAL_COMMUNITY): Payer: Medicare Other

## 2018-01-01 ENCOUNTER — Inpatient Hospital Stay (HOSPITAL_BASED_OUTPATIENT_CLINIC_OR_DEPARTMENT_OTHER): Payer: Medicare Other | Admitting: Adult Health

## 2018-01-01 ENCOUNTER — Inpatient Hospital Stay: Payer: Medicare Other

## 2018-01-01 ENCOUNTER — Telehealth: Payer: Self-pay | Admitting: Adult Health

## 2018-01-01 VITALS — BP 116/71 | HR 67 | Temp 98.1°F | Resp 18

## 2018-01-01 DIAGNOSIS — C8585 Other specified types of non-Hodgkin lymphoma, lymph nodes of inguinal region and lower limb: Secondary | ICD-10-CM

## 2018-01-01 DIAGNOSIS — C8330 Diffuse large B-cell lymphoma, unspecified site: Secondary | ICD-10-CM

## 2018-01-01 DIAGNOSIS — C8580 Other specified types of non-Hodgkin lymphoma, unspecified site: Principal | ICD-10-CM

## 2018-01-01 LAB — CBC WITH DIFFERENTIAL (CANCER CENTER ONLY)
Abs Immature Granulocytes: 0.01 10*3/uL (ref 0.00–0.07)
BASOS ABS: 0 10*3/uL (ref 0.0–0.1)
BASOS PCT: 1 %
EOS ABS: 0.3 10*3/uL (ref 0.0–0.5)
EOS PCT: 5 %
HCT: 41.9 % (ref 39.0–52.0)
Hemoglobin: 13.8 g/dL (ref 13.0–17.0)
Immature Granulocytes: 0 %
Lymphocytes Relative: 32 %
Lymphs Abs: 1.7 10*3/uL (ref 0.7–4.0)
MCH: 29.7 pg (ref 26.0–34.0)
MCHC: 32.9 g/dL (ref 30.0–36.0)
MCV: 90.1 fL (ref 80.0–100.0)
Monocytes Absolute: 0.6 10*3/uL (ref 0.1–1.0)
Monocytes Relative: 12 %
NRBC: 0 % (ref 0.0–0.2)
Neutro Abs: 2.6 10*3/uL (ref 1.7–7.7)
Neutrophils Relative %: 50 %
Platelet Count: 192 10*3/uL (ref 150–400)
RBC: 4.65 MIL/uL (ref 4.22–5.81)
RDW: 14.5 % (ref 11.5–15.5)
WBC Count: 5.1 10*3/uL (ref 4.0–10.5)

## 2018-01-01 MED ORDER — LIDOCAINE HCL 2 % IJ SOLN
INTRAMUSCULAR | Status: AC
  Filled 2018-01-01: qty 20

## 2018-01-01 MED ORDER — ALLOPURINOL 300 MG PO TABS: 300.0000 mg | ORAL_TABLET | Freq: Every day | ORAL | 5 refills | Status: DC

## 2018-01-01 NOTE — Progress Notes (Signed)
INDICATION: lymphoma   Bone Marrow Biopsy and Aspiration Procedure Note   Informed consent was obtained and potential risks including bleeding, infection and pain were reviewed with the patient.  The patient's name, date of birth, identification, consent and allergies were verified prior to the start of procedure and time out was performed.  The left posterior iliac crest was chosen as the site of biopsy.  The skin was prepped with ChloraPrep.   8 cc of 2% lidocaine was used to provide local anaesthesia.   10 cc of bone marrow aspirate was obtained followed by 1cm biopsy.  Pressure was applied to the biopsy site and bandage was placed over the biopsy site. Patient was made to lie on the back for 15 mins prior to discharge.  The procedure was tolerated well. COMPLICATIONS: None BLOOD LOSS: none The patient was discharged home in stable condition with a 1 week follow up to review results.  Patient was provided with post bone marrow biopsy instructions and instructed to call if there was any bleeding or worsening pain.  Specimens sent for flow cytometry, cytogenetics and additional studies.  Signed Scot Dock, NP

## 2018-01-01 NOTE — Telephone Encounter (Signed)
No 10/23 los orders.

## 2018-01-01 NOTE — Patient Instructions (Signed)
Bone Marrow Aspiration and Bone Marrow Biopsy, Adult, Care After This sheet gives you information about how to care for yourself after your procedure. Your health care provider may also give you more specific instructions. If you have problems or questions, contact your health care provider. What can I expect after the procedure? After the procedure, it is common to have:  Mild pain and tenderness.  Swelling.  Bruising.  Follow these instructions at home:  Take over-the-counter or prescription medicines only as told by your health care provider.  Do not take baths, swim, or use a hot tub until your health care provider approves. Ask if you can take a shower or have a sponge bath.  Follow instructions from your health care provider about how to take care of the puncture site. Make sure you: ? Wash your hands with soap and water before you change your bandage (dressing). If soap and water are not available, use hand sanitizer. ? Change your dressing as told by your health care provider.  Check your puncture siteevery day for signs of infection. Check for: ? More redness, swelling, or pain. ? More fluid or blood. ? Warmth. ? Pus or a bad smell.  Return to your normal activities as told by your health care provider. Ask your health care provider what activities are safe for you.  Do not drive for 24 hours if you were given a medicine to help you relax (sedative).  Keep all follow-up visits as told by your health care provider. This is important. Contact a health care provider if:  You have more redness, swelling, or pain around the puncture site.  You have more fluid or blood coming from the puncture site.  Your puncture site feels warm to the touch.  You have pus or a bad smell coming from the puncture site.  You have a fever.  Your pain is not controlled with medicine. This information is not intended to replace advice given to you by your health care provider. Make sure  you discuss any questions you have with your health care provider. Document Released: 09/15/2004 Document Revised: 09/16/2015 Document Reviewed: 08/10/2015 Elsevier Interactive Patient Education  2018 [employer].

## 2018-01-02 ENCOUNTER — Telehealth: Payer: Self-pay | Admitting: Adult Health

## 2018-01-02 NOTE — Telephone Encounter (Signed)
Called patient and let him know that his preliminary bone marrow results are good and show no lymphoma in the bone marrow.  Patient and his wife express gratitude for call.  Wilber Bihari, NP

## 2018-01-03 ENCOUNTER — Ambulatory Visit (HOSPITAL_COMMUNITY): Payer: Medicare Other | Attending: Oncology

## 2018-01-03 ENCOUNTER — Other Ambulatory Visit: Payer: Self-pay

## 2018-01-03 DIAGNOSIS — K5730 Diverticulosis of large intestine without perforation or abscess without bleeding: Secondary | ICD-10-CM | POA: Diagnosis present

## 2018-01-03 DIAGNOSIS — N1330 Unspecified hydronephrosis: Secondary | ICD-10-CM | POA: Diagnosis present

## 2018-01-03 DIAGNOSIS — C8330 Diffuse large B-cell lymphoma, unspecified site: Secondary | ICD-10-CM

## 2018-01-03 DIAGNOSIS — Z23 Encounter for immunization: Secondary | ICD-10-CM | POA: Diagnosis present

## 2018-01-03 DIAGNOSIS — C8580 Other specified types of non-Hodgkin lymphoma, unspecified site: Secondary | ICD-10-CM | POA: Diagnosis present

## 2018-01-03 DIAGNOSIS — N200 Calculus of kidney: Secondary | ICD-10-CM | POA: Diagnosis present

## 2018-01-03 DIAGNOSIS — I700 Atherosclerosis of aorta: Secondary | ICD-10-CM

## 2018-01-03 DIAGNOSIS — J8410 Pulmonary fibrosis, unspecified: Secondary | ICD-10-CM | POA: Diagnosis present

## 2018-01-07 ENCOUNTER — Telehealth: Payer: Self-pay | Admitting: Oncology

## 2018-01-07 NOTE — Telephone Encounter (Signed)
Faxed medical records to [facility] [phone], Release ID: [id number]

## 2018-01-09 ENCOUNTER — Encounter (HOSPITAL_COMMUNITY): Payer: Self-pay | Admitting: Oncology

## 2018-01-09 NOTE — Progress Notes (Signed)
[place]  [phone]) [phone] Fax:(336) [phone]     ID: Michael Allen DOB: March 07, 1946  MR#: [id number]  YIA#:[id number]  Patient Care Team: Seward Carol, MD as PCP - General (Internal Medicine) [name], MD as Consulting Physician (Urology) Louisa Favaro, [name], MD as Consulting Physician (Oncology) [name], MD OTHER MD:  CHIEF COMPLAINT: Diffuse large cell non-Hodgkin's lymphoma, germinal center B-cell subtype  CURRENT TREATMENT: CHOP-R chemotherapy   HISTORY OF CURRENT ILLNESS: From the original intake note:  The patient was referred to Dr. [name] for renal stones.  Incidentally he was noted to have left scrotal swelling.  This had been present several months and was initially felt to be a hydrocele.  Scrotal ultrasound was performed 12/06/2017.  This found no spermatocele but a solid mass in the left testis upper and lower pole.  Accordingly on 12/20/2017 the patient underwent left inguinal orchiectomy.  The pathology from this procedure (SZ [phone]) showed a diffuse large B cell non-Hodgkin's lymphoma, CD20 positive, CD10 weakly positive, no coexpression of CD5, and most consistent with a germinal center B-cell subtype.  I was called by Dr. [name] earlier today and the patient was brought in for further evaluation  His subsequent history is as detailed below.  INTERVAL HISTORY: Mr. Michael Allen was evaluated in the cancer clinic on 01/10/2018 accompanied by his wife and daughter.   Since our last visit, he had a PET scan on 12/31/2017 that revealed no findings of residual hypermetabolic lymphoma with hypermetabolism within the right testicle is favored to be physiologic. No right-sided mass on prior ultrasound. Right maxillary sinus hypermetabolism is likely related to chronic sinusitis. Small bowel mesenteric findings which suggest mesenteric adenitis/panniculitis, that is of indeterminate clinical significance. Coronary artery  atherosclerosis and aortic atherosclerosis were also found. Right nephrolithiasis was noted.   He also had a BM biopsy with cytogenetics  on 01/01/2018 that revealed no abnormalities histologically or by flow cytometry. He and his wife were informed via a telephone encounter with NP Mendel Ryder the following day.  Echocardiogram 01/03/2018 showed an ejection fraction in the 60-65% range.  REVIEW OF SYSTEMS: He did well with his previous tests. He doesn't have a port. He and his family are traveling to [place] soon on 11/20-11/29. He denies insomnia and states that there is no pain, no swelling, no bleeding, and no fever.  There has been no unexplained fatigue or weight loss, no rash, no drenching sweats and no fevers.  A detailed review of systems today was otherwise entirely noncontributory.   PAST MEDICAL HISTORY: Past Medical History:  Diagnosis Date  . Chalazion   . Dental crowns present   . GERD (gastroesophageal reflux disease)   . History of kidney stones 10/25/2017   right nonobstructing stone noted on CT ABD/Pelvis  . Hydronephrosis 10/25/2017   Mild to moderate right due to 73m stone right distal, noted on CT abd/pelvis  . Hydroureter, right 10/25/2017   470mstone right distal, noted on CT abd/pelvis  . Impaired fasting glucose   . Migraine    optical  . Plantar fasciitis 2003  . PONV (postoperative nausea and vomiting)    light  . Pure hyperglyceridemia   . Rectal bleeding 2002  . Recurrent spontaneous pneumothorax    due to blebsx2  . Skin cancer of scalp 2011  . Transfusion of blood product refused for religious reason   . Ureteral stone 10/25/2017   25m71mtone right distal, noted on CT abd/pelvis  . Wears glasses  PAST SURGICAL HISTORY: Past Surgical History:  Procedure Laterality Date  . CHALAZION EXCISION Right    cyst removal , right eye  . COLONOSCOPY    . [place]  2002  . INGUINAL HERNIA REPAIR Bilateral 2005   wire mesh  . LUNG SURGERY      x2  . ORCHIECTOMY Left 12/20/2017   Procedure: ORCHIECTOMY;  Surgeon: [name], MD;  Location: [facility];  Service: Urology;  Laterality: Left;  . TONSILLECTOMY      FAMILY HISTORY Family History  Problem Relation Age of Onset  . Dementia Mother        progressive  . Breast cancer Mother   . Heart Problems Father        pacemaker  . CAD Father   . Heart disease Father   The patient's father died from heart and lung problems in his 85s.  The patient's mother had breast cancer in her 53s, died in her 15s from Alzheimer's disease.  The patient had no brothers, 2 sisters.  One sister has what sounds like lung cancer metastatic to the brain, in her 65s.  One maternal cousin was diagnosed with Ms. to have been breast cancer metastatic to the lung in her 31s.   SOCIAL HISTORY:  The patient retired from [employer] more than 20 years ago.  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   [name] is an education major but mostly worked as a housewife.  Daughter Michael Allen is a "mom", and sign language interpreted as well as very active in the natural path movement.  She lives in [place].  Son Michael Allen lives in [place] where he works for the [employer].  The patient has 2 grandchildren    ADVANCED DIRECTIVES: Please note patient would refuse a blood transfusion even for lifesaving reasons (discussed 12/27/2017   HEALTH MAINTENANCE: Social History   Tobacco Use  . Smoking status: Former Research scientist (life sciences)  . Smokeless tobacco: Never Used  . Tobacco comment: age 55  Substance Use Topics  . Alcohol use: Yes    Comment: occ beer  . Drug use: Never     Colonoscopy: 2015  PSA:  Bone density:   Allergies  Allergen Reactions  . Blood-Group Specific Substance   . Oxycodone   . Penicillins   . Shellfish Allergy Hives    Shell fish and shrimp    Current Outpatient Medications  Medication Sig Dispense Refill  . ondansetron (ZOFRAN) 8 MG tablet Take 1 tablet (8  mg total) by mouth 2 (two) times daily as needed for refractory nausea / vomiting. Start on day 3 after cyclophosphamide chemotherapy. 30 tablet 1  . valACYclovir (VALTREX) 500 MG tablet Take 1 tablet (500 mg total) by mouth 2 (two) times daily. 90 tablet 3  . allopurinol (ZYLOPRIM) 300 MG tablet Take 1 tablet (300 mg total) by mouth daily. 30 tablet 5  . predniSONE (DELTASONE) 20 MG tablet Take 3 tablets (60 mg total) by mouth daily. Take on days 1-5 of chemotherapy. 50 tablet 6  . prochlorperazine (COMPAZINE) 10 MG tablet Take 1 tablet (10 mg total) by mouth every 6 (six) hours as needed (Nausea or vomiting). 30 tablet 6   No current facility-administered medications for this visit.     OBJECTIVE: Middle-aged white man who appears well  Vitals:   01/10/18 0952  BP: 125/74  Pulse: 75  Resp: 19  Temp: 98.8 F (37.1 C)  SpO2: 100%  Body mass index is 23.53 kg/m.   Wt Readings from Last 3 Encounters:  01/10/18 183 lb 4.8 oz (83.1 kg)  12/27/17 180 lb 4.8 oz (81.8 kg)  12/20/17 180 lb 12.8 oz (82 kg)      ECOG FS:0 - Asymptomatic  Sclerae unicteric, EOMs intact Oropharynx clear and moist No cervical or supraclavicular adenopathy, no axillary or inguinal adenopathy Lungs no rales or rhonchi Heart regular rate and rhythm Abd soft, nontender, positive bowel sounds MSK no focal spinal tenderness, no upper extremity lymphedema Neuro: nonfocal, well oriented, appropriate affect  LAB RESULTS:  CMP     Component Value Date/Time   NA 141 12/27/2017 1426   K 4.2 12/27/2017 1426   CL 104 12/27/2017 1426   CO2 26 12/27/2017 1426   GLUCOSE 91 12/27/2017 1426   BUN 15 12/27/2017 1426   CREATININE 0.96 12/27/2017 1426   CALCIUM 9.5 12/27/2017 1426   PROT 8.0 12/27/2017 1426   ALBUMIN 4.2 12/27/2017 1426   AST 23 12/27/2017 1426   ALT 13 12/27/2017 1426   ALKPHOS 78 12/27/2017 1426   BILITOT 0.8 12/27/2017 1426   GFRNONAA >60 12/27/2017 1426   GFRAA >60 12/27/2017 1426     No results found for: TOTALPROTELP, ALBUMINELP, A1GS, A2GS, BETS, BETA2SER, GAMS, MSPIKE, SPEI  No results found for: Nils Pyle, [facility]  Lab Results  Component Value Date   WBC 5.1 01/01/2018   NEUTROABS 2.6 01/01/2018   HGB 13.8 01/01/2018   HCT 41.9 01/01/2018   MCV 90.1 01/01/2018   PLT 192 01/01/2018      Chemistry      Component Value Date/Time   NA 141 12/27/2017 1426   K 4.2 12/27/2017 1426   CL 104 12/27/2017 1426   CO2 26 12/27/2017 1426   BUN 15 12/27/2017 1426   CREATININE 0.96 12/27/2017 1426      Component Value Date/Time   CALCIUM 9.5 12/27/2017 1426   ALKPHOS 78 12/27/2017 1426   AST 23 12/27/2017 1426   ALT 13 12/27/2017 1426   BILITOT 0.8 12/27/2017 1426       No results found for: LABCA2  No components found for: RJJOAC166  No results for input(s): INR in the last 168 hours.  No results found for: LABCA2  No results found for: AYT016  No results found for: WFU932  No results found for: TFT732  No results found for: CA2729  No components found for: HGQUANT  No results found for: CEA1 / No results found for: CEA1   No results found for: AFPTUMOR  No results found for: CHROMOGRNA  No results found for: PSA1  No visits with results within 3 Day(s) from this visit.  Latest known visit with results is:  Appointment on 01/01/2018  Component Date Value Ref Range Status  . WBC Count 01/01/2018 5.1  4.0 - 10.5 K/uL Final  . RBC 01/01/2018 4.65  4.22 - 5.81 MIL/uL Final  . Hemoglobin 01/01/2018 13.8  13.0 - 17.0 g/dL Final  . HCT 01/01/2018 41.9  39.0 - 52.0 % Final  . MCV 01/01/2018 90.1  80.0 - 100.0 fL Final  . MCH 01/01/2018 29.7  26.0 - 34.0 pg Final  . MCHC 01/01/2018 32.9  30.0 - 36.0 g/dL Final  . RDW 01/01/2018 14.5  11.5 - 15.5 % Final  . Platelet Count 01/01/2018 192  150 - 400 K/uL Final  . nRBC 01/01/2018 0.0  0.0 - 0.2 % Final  . Neutrophils Relative % 01/01/2018 50  %  Final  . Neutro Abs  01/01/2018 2.6  1.7 - 7.7 K/uL Final  . Lymphocytes Relative 01/01/2018 32  % Final  . Lymphs Abs 01/01/2018 1.7  0.7 - 4.0 K/uL Final  . Monocytes Relative 01/01/2018 12  % Final  . Monocytes Absolute 01/01/2018 0.6  0.1 - 1.0 K/uL Final  . Eosinophils Relative 01/01/2018 5  % Final  . Eosinophils Absolute 01/01/2018 0.3  0.0 - 0.5 K/uL Final  . Basophils Relative 01/01/2018 1  % Final  . Basophils Absolute 01/01/2018 0.0  0.0 - 0.1 K/uL Final  . Immature Granulocytes 01/01/2018 0  % Final  . Abs Immature Granulocytes 01/01/2018 0.01  0.00 - 0.07 K/uL Final   Performed at [facility], [place] [address] [place], [place] [address]    (this displays the last labs from the last 3 days)  No results found for: TOTALPROTELP, ALBUMINELP, A1GS, A2GS, BETS, BETA2SER, GAMS, MSPIKE, SPEI (this displays SPEP labs)  No results found for: KPAFRELGTCHN, LAMBDASER, KAPLAMBRATIO (kappa/lambda light chains)  No results found for: HGBA, HGBA2QUANT, HGBFQUANT, HGBSQUAN (Hemoglobinopathy evaluation)   Lab Results  Component Value Date   LDH 232 (H) 12/27/2017    No results found for: IRON, TIBC, IRONPCTSAT (Iron and TIBC)  No results found for: FERRITIN  Urinalysis No results found for: COLORURINE, APPEARANCEUR, LABSPEC, PHURINE, GLUCOSEU, HGBUR, BILIRUBINUR, KETONESUR, PROTEINUR, UROBILINOGEN, NITRITE, LEUKOCYTESUR   STUDIES: Nm Pet Image Initial (pi) Skull Base To Thigh  Result Date: 12/31/2017 CLINICAL DATA:  Initial treatment strategy for malignant neoplasm of left testes. Diffuse large B-cell lymphoma. EXAM: NUCLEAR MEDICINE PET SKULL BASE TO THIGH TECHNIQUE: 9.0 mCi F-18 FDG was injected intravenously. Full-ring PET imaging was performed from the skull base to thigh after the radiotracer. CT data was obtained and used for attenuation correction and anatomic localization. Fasting blood glucose: 102 mg/dl COMPARISON:  Abdominopelvic CT 10/25/2017 FINDINGS: Mediastinal  blood pool activity: SUV max 2.9 NECK: No cervical nodal hypermetabolism. Right maxillary sinus hypermetabolism corresponds to typical findings of chronic sinusitis, including mucosal thickening and mucous retention cysts or polyps. This measures a S.U.V. max of 17.1. Incidental CT findings: No cervical adenopathy. CHEST: No pulmonary parenchymal or thoracic nodal hypermetabolism. Incidental CT findings: Mild cardiomegaly. Lad coronary artery calcification. Right paratracheal calcified node is likely due to old granulomatous disease. Posterior right upper lobe calcified granuloma. ABDOMEN/PELVIS: Bilateral adrenal hypermetabolism, without well-defined nodule or mass. Greater on the left, at a S.U.V. max of 3.7. Likely physiologic. There is hypermetabolism in the left inguinal canal which is likely postoperative and corresponds to fluid/soft tissue thickening. The right testicle demonstrates moderate hypermetabolism, including at a S.U.V. max of 4.5. No right testicular mass on prior ultrasound. Incidental CT findings: Posterior gastric diverticulum. Aortic atherosclerosis. Inter/lower pole 4 mm right renal collecting system calculus. Hepatic cysts. Increased density in the small bowel mesentery with small mesenteric nodes. No abdominopelvic adenopathy. The proximal right common iliac is borderline ectatic at 1.5 cm. Prior bilateral inguinal hernia repair. Moderate prostatomegaly. SKELETON: No abnormal marrow activity. Incidental CT findings: Remote left rib fractures. IMPRESSION: 1. No findings of residual hypermetabolic lymphoma. 2. Hypermetabolism within the right testicle is favored to be physiologic. No right-sided mass on prior ultrasound. 3. Right maxillary sinus hypermetabolism is likely related to chronic sinusitis. 4. Small bowel mesenteric findings which suggest mesenteric adenitis/panniculitis. Of indeterminate clinical significance. 5. Coronary artery atherosclerosis. Aortic Atherosclerosis  (ICD10-I70.0). 6. Right nephrolithiasis. Electronically Signed   By: Abigail Miyamoto M.D.   On: 12/31/2017  09:52     ELIGIBLE FOR AVAILABLE RESEARCH PROTOCOL: no  ASSESSMENT: 72 y.o. Jehovah's Witness status post left inguinal orchiectomy 12/20/2017 for a diffuse large cell non-Hodgkin's lymphoma, germinal center B-cell subtype, CD20 positive, CD5 negative.  (1) staging studies pending:  (a) PET scan 12/31/2017: Uptake only in the right testicle and panniculus, both of questionable significance  (b) bone marrow biopsy 01/01/2018 shows no evidence of lymphoma  (c) lumbar puncture pending  (2) international prognostic index: 2 =80% predicted PFS4; stage I (or II if panniculus involved)  (3) cyclophosphamide, doxorubicin, vincristine, prednisone, and rituximab to start 01/16/2018  (a) plan is for 3 cycles of CHOP-R, then adjuvant radiation therapy   PLAN: We reviewed the results of the PET scan in detail and also the results of the bone marrow biopsy, both of which are very favorable.  It is difficult to know whether the uptake in the panniculus on the PET scan is of significance or not.  If positive that would make him stage II but would not affect the international prognostic index reading, which remains very favorable.  He is now ready to start CHOP/Rituxan.  We discussed the possible toxicity side effects and complications of these agents.  At this point he does not feel he needs a port and I agree since only 3 cycles are planned.  For nausea control we are going to rely on prochlorperazine which she will take before meals days 2 and 3 of each cycle.  Beginning on day 3 he may use either Compazine or ondansetron for nausea control.  I am making him an appointment with radiation oncology to discuss radiation treatment.  If the patient opts against radiation he would need 6 cycles of chemotherapy.  After his first cycle of CHOP right toxin he will have his lumbar puncture.  Note that the  patient is planning to travel to [place] 01/29/2018 through 02/07/2018.  I have given him my cell number in case he develops any problem while abroad.  Md is a good understanding of the overall plan.  He agrees with it.  He knows the goal of treatment is cure.    Dierdre Searles Dweik am acting as scribe for Dr. [name] Carleena Mires.  I, Lurline Del MD, have reviewed the above documentation for accuracy and completeness, and I agree with the above.    [name], MD   01/10/2018 5:26 PM Medical Oncology and Hematology [facility] [address] [place], [place] [address] Tel. [phone]    Fax. [phone]

## 2018-01-10 ENCOUNTER — Telehealth: Payer: Self-pay | Admitting: Oncology

## 2018-01-10 ENCOUNTER — Inpatient Hospital Stay: Payer: Medicare Other | Attending: Oncology | Admitting: Oncology

## 2018-01-10 VITALS — BP 125/74 | HR 75 | Temp 98.8°F | Resp 19 | Ht 74.0 in | Wt 183.3 lb

## 2018-01-10 DIAGNOSIS — C8330 Diffuse large B-cell lymphoma, unspecified site: Secondary | ICD-10-CM

## 2018-01-10 DIAGNOSIS — Z87891 Personal history of nicotine dependence: Secondary | ICD-10-CM | POA: Diagnosis not present

## 2018-01-10 DIAGNOSIS — Z803 Family history of malignant neoplasm of breast: Secondary | ICD-10-CM | POA: Insufficient documentation

## 2018-01-10 DIAGNOSIS — T451X5A Adverse effect of antineoplastic and immunosuppressive drugs, initial encounter: Secondary | ICD-10-CM | POA: Insufficient documentation

## 2018-01-10 DIAGNOSIS — Z5111 Encounter for antineoplastic chemotherapy: Secondary | ICD-10-CM | POA: Diagnosis present

## 2018-01-10 DIAGNOSIS — Z5189 Encounter for other specified aftercare: Secondary | ICD-10-CM | POA: Diagnosis not present

## 2018-01-10 DIAGNOSIS — Z85828 Personal history of other malignant neoplasm of skin: Secondary | ICD-10-CM | POA: Diagnosis not present

## 2018-01-10 DIAGNOSIS — C8580 Other specified types of non-Hodgkin lymphoma, unspecified site: Secondary | ICD-10-CM

## 2018-01-10 DIAGNOSIS — C8585 Other specified types of non-Hodgkin lymphoma, lymph nodes of inguinal region and lower limb: Secondary | ICD-10-CM | POA: Insufficient documentation

## 2018-01-10 DIAGNOSIS — D701 Agranulocytosis secondary to cancer chemotherapy: Secondary | ICD-10-CM | POA: Insufficient documentation

## 2018-01-10 MED ORDER — PREDNISONE 20 MG PO TABS: 60.0000 mg | ORAL_TABLET | Freq: Every day | ORAL | 6 refills | Status: DC

## 2018-01-10 MED ORDER — PROCHLORPERAZINE MALEATE 10 MG PO TABS: 10.0000 mg | ORAL_TABLET | Freq: Four times a day (QID) | ORAL | 6 refills | Status: DC | PRN

## 2018-01-10 NOTE — Telephone Encounter (Signed)
Gave patient avs and calendar.   °

## 2018-01-15 ENCOUNTER — Encounter: Payer: Self-pay | Admitting: Oncology

## 2018-01-15 ENCOUNTER — Inpatient Hospital Stay: Payer: Medicare Other

## 2018-01-15 NOTE — Progress Notes (Signed)
Met with patient and spouse to introduce myself as Arboriculturist and to give my card for any financial questions or concerns.  Briefly discussed insurance ded/OOP and that with a 80/20 plan, the insurance would pay 80% and he would be billed for 20% of services until OOP has been met. They verbalized understanding.

## 2018-01-17 ENCOUNTER — Inpatient Hospital Stay: Payer: Medicare Other

## 2018-01-17 VITALS — BP 110/72 | HR 83 | Temp 97.5°F | Resp 16 | Wt 186.0 lb

## 2018-01-17 DIAGNOSIS — C8580 Other specified types of non-Hodgkin lymphoma, unspecified site: Secondary | ICD-10-CM

## 2018-01-17 DIAGNOSIS — C8330 Diffuse large B-cell lymphoma, unspecified site: Secondary | ICD-10-CM

## 2018-01-17 DIAGNOSIS — K5730 Diverticulosis of large intestine without perforation or abscess without bleeding: Secondary | ICD-10-CM

## 2018-01-17 DIAGNOSIS — N1330 Unspecified hydronephrosis: Secondary | ICD-10-CM

## 2018-01-17 DIAGNOSIS — N200 Calculus of kidney: Secondary | ICD-10-CM

## 2018-01-17 DIAGNOSIS — I700 Atherosclerosis of aorta: Secondary | ICD-10-CM

## 2018-01-17 DIAGNOSIS — J8410 Pulmonary fibrosis, unspecified: Secondary | ICD-10-CM

## 2018-01-17 DIAGNOSIS — Z5111 Encounter for antineoplastic chemotherapy: Secondary | ICD-10-CM | POA: Diagnosis not present

## 2018-01-17 LAB — COMPREHENSIVE METABOLIC PANEL
ALBUMIN: 3.7 g/dL (ref 3.5–5.0)
ALK PHOS: 81 U/L (ref 38–126)
ALT: 14 U/L (ref 0–44)
AST: 22 U/L (ref 15–41)
Anion gap: 9 (ref 5–15)
BUN: 18 mg/dL (ref 8–23)
CALCIUM: 9.1 mg/dL (ref 8.9–10.3)
CO2: 24 mmol/L (ref 22–32)
CREATININE: 0.82 mg/dL (ref 0.61–1.24)
Chloride: 109 mmol/L (ref 98–111)
Glucose, Bld: 101 mg/dL — ABNORMAL HIGH (ref 70–99)
Potassium: 4.2 mmol/L (ref 3.5–5.1)
SODIUM: 142 mmol/L (ref 135–145)
Total Bilirubin: 0.5 mg/dL (ref 0.3–1.2)
Total Protein: 7 g/dL (ref 6.5–8.1)

## 2018-01-17 LAB — CBC WITH DIFFERENTIAL/PLATELET
Abs Immature Granulocytes: 0.01 10*3/uL (ref 0.00–0.07)
BASOS ABS: 0.1 10*3/uL (ref 0.0–0.1)
Basophils Relative: 1 %
EOS ABS: 0.3 10*3/uL (ref 0.0–0.5)
EOS PCT: 5 %
HCT: 44.9 % (ref 39.0–52.0)
Hemoglobin: 14.7 g/dL (ref 13.0–17.0)
Immature Granulocytes: 0 %
LYMPHS ABS: 2.5 10*3/uL (ref 0.7–4.0)
Lymphocytes Relative: 38 %
MCH: 29.8 pg (ref 26.0–34.0)
MCHC: 32.7 g/dL (ref 30.0–36.0)
MCV: 90.9 fL (ref 80.0–100.0)
Monocytes Absolute: 0.6 10*3/uL (ref 0.1–1.0)
Monocytes Relative: 9 %
NRBC: 0 % (ref 0.0–0.2)
Neutro Abs: 3.2 10*3/uL (ref 1.7–7.7)
Neutrophils Relative %: 47 %
Platelets: 199 10*3/uL (ref 150–400)
RBC: 4.94 MIL/uL (ref 4.22–5.81)
RDW: 15.1 % (ref 11.5–15.5)
WBC: 6.7 10*3/uL (ref 4.0–10.5)

## 2018-01-17 LAB — LACTATE DEHYDROGENASE: LDH: 152 U/L (ref 98–192)

## 2018-01-17 LAB — URIC ACID: Uric Acid, Serum: 3.7 mg/dL (ref 3.7–8.6)

## 2018-01-17 MED ORDER — SODIUM CHLORIDE 0.9 % IV SOLN
Freq: Once | INTRAVENOUS | Status: AC
  Administered 2018-01-17: 10:00:00 via INTRAVENOUS
  Filled 2018-01-17: qty 250

## 2018-01-17 MED ORDER — ACETAMINOPHEN 325 MG PO TABS
650.0000 mg | ORAL_TABLET | Freq: Once | ORAL | Status: AC
  Administered 2018-01-17: 650 mg via ORAL

## 2018-01-17 MED ORDER — DIPHENHYDRAMINE HCL 25 MG PO CAPS
ORAL_CAPSULE | ORAL | Status: AC
  Filled 2018-01-17: qty 1

## 2018-01-17 MED ORDER — SODIUM CHLORIDE 0.9 % IV SOLN
Freq: Once | INTRAVENOUS | Status: AC
  Administered 2018-01-17: 10:00:00 via INTRAVENOUS
  Filled 2018-01-17: qty 5

## 2018-01-17 MED ORDER — VINCRISTINE SULFATE CHEMO INJECTION 1 MG/ML
1.0000 mg | Freq: Once | INTRAVENOUS | Status: AC
  Administered 2018-01-17: 1 mg via INTRAVENOUS
  Filled 2018-01-17: qty 1

## 2018-01-17 MED ORDER — ACETAMINOPHEN 325 MG PO TABS
ORAL_TABLET | ORAL | Status: AC
  Filled 2018-01-17: qty 2

## 2018-01-17 MED ORDER — DIPHENHYDRAMINE HCL 25 MG PO CAPS
25.0000 mg | ORAL_CAPSULE | Freq: Once | ORAL | Status: AC
  Administered 2018-01-17: 25 mg via ORAL

## 2018-01-17 MED ORDER — PALONOSETRON HCL INJECTION 0.25 MG/5ML
INTRAVENOUS | Status: AC
  Filled 2018-01-17: qty 5

## 2018-01-17 MED ORDER — PALONOSETRON HCL INJECTION 0.25 MG/5ML
0.2500 mg | Freq: Once | INTRAVENOUS | Status: AC
  Administered 2018-01-17: 0.25 mg via INTRAVENOUS

## 2018-01-17 MED ORDER — DOXORUBICIN HCL CHEMO IV INJECTION 2 MG/ML
50.0000 mg/m2 | Freq: Once | INTRAVENOUS | Status: AC
  Administered 2018-01-17: 104 mg via INTRAVENOUS
  Filled 2018-01-17: qty 52

## 2018-01-17 MED ORDER — SODIUM CHLORIDE 0.9 % IV SOLN
375.0000 mg/m2 | Freq: Once | INTRAVENOUS | Status: AC
  Administered 2018-01-17: 800 mg via INTRAVENOUS
  Filled 2018-01-17: qty 50

## 2018-01-17 MED ORDER — SODIUM CHLORIDE 0.9 % IV SOLN
750.0000 mg/m2 | Freq: Once | INTRAVENOUS | Status: AC
  Administered 2018-01-17: 1560 mg via INTRAVENOUS
  Filled 2018-01-17: qty 78

## 2018-01-17 NOTE — Patient Instructions (Addendum)
[facility] Discharge Instructions for Patients Receiving Chemotherapy  Today you received the following chemotherapy agents Rituxan, Adriamycin, Vincristine, Cytoxan.   To help prevent nausea and vomiting after your treatment, we encourage you to take your nausea medication as directed.  If you develop nausea and vomiting that is not controlled by your nausea medication, call the clinic.   BELOW ARE SYMPTOMS THAT SHOULD BE REPORTED IMMEDIATELY:  *FEVER GREATER THAN 100.5 F  *CHILLS WITH OR WITHOUT FEVER  NAUSEA AND VOMITING THAT IS NOT CONTROLLED WITH YOUR NAUSEA MEDICATION  *UNUSUAL SHORTNESS OF BREATH  *UNUSUAL BRUISING OR BLEEDING  TENDERNESS IN MOUTH AND THROAT WITH OR WITHOUT PRESENCE OF ULCERS  *URINARY PROBLEMS  *BOWEL PROBLEMS  UNUSUAL RASH Items with * indicate a potential emergency and should be followed up as soon as possible.  Feel free to call the clinic should you have any questions or concerns. The clinic phone number is [phone].  Please show the [place] at check-in to the Emergency Department and triage nurse.  Doxorubicin injection What is this medicine? DOXORUBICIN (dox oh ROO bi sin) is a chemotherapy drug. It is used to treat many kinds of cancer like leukemia, lymphoma, neuroblastoma, sarcoma, and Wilms' tumor. It is also used to treat bladder cancer, breast cancer, lung cancer, ovarian cancer, stomach cancer, and thyroid cancer. This medicine may be used for other purposes; ask your health care provider or pharmacist if you have questions. COMMON BRAND NAME(S): Adriamycin, Adriamycin PFS, Adriamycin RDF, Rubex What should I tell my health care provider before I take this medicine? They need to know if you have any of these conditions: -heart disease -history of low blood counts caused by a medicine -liver disease -recent or ongoing radiation therapy -an unusual or allergic reaction to doxorubicin, other chemotherapy  agents, other medicines, foods, dyes, or preservatives -pregnant or trying to get pregnant -breast-feeding How should I use this medicine? This drug is given as an infusion into a vein. It is administered in a hospital or clinic by a specially trained health care professional. If you have pain, swelling, burning or any unusual feeling around the site of your injection, tell your health care professional right away. Talk to your pediatrician regarding the use of this medicine in children. Special care may be needed. Overdosage: If you think you have taken too much of this medicine contact a poison control center or emergency room at once. NOTE: This medicine is only for you. Do not share this medicine with others. What if I miss a dose? It is important not to miss your dose. Call your doctor or health care professional if you are unable to keep an appointment. What may interact with this medicine? This medicine may interact with the following medications: -6-mercaptopurine -paclitaxel -phenytoin -St. [place]ualdo's Wort -trastuzumab -verapamil This list may not describe all possible interactions. Give your health care provider a list of all the medicines, herbs, non-prescription drugs, or dietary supplements you use. Also tell them if you smoke, drink alcohol, or use illegal drugs. Some items may interact with your medicine. What should I watch for while using this medicine? This drug may make you feel generally unwell. This is not uncommon, as chemotherapy can affect healthy cells as well as cancer cells. Report any side effects. Continue your course of treatment even though you feel ill unless your doctor tells you to stop. There is a maximum amount of this medicine you should receive throughout your life. The amount depends  on the medical condition being treated and your overall health. Your doctor will watch how much of this medicine you receive in your lifetime. Tell your doctor if you have taken  this medicine before. You may need blood work done while you are taking this medicine. Your urine may turn red for a few days after your dose. This is not blood. If your urine is dark or brown, call your doctor. In some cases, you may be given additional medicines to help with side effects. Follow all directions for their use. Call your doctor or health care professional for advice if you get a fever, chills or sore throat, or other symptoms of a cold or flu. Do not treat yourself. This drug decreases your body's ability to fight infections. Try to avoid being around people who are sick. This medicine may increase your risk to bruise or bleed. Call your doctor or health care professional if you notice any unusual bleeding. Talk to your doctor about your risk of cancer. You may be more at risk for certain types of cancers if you take this medicine. Do not become pregnant while taking this medicine or for 6 months after stopping it. Women should inform their doctor if they wish to become pregnant or think they might be pregnant. Men should not father a child while taking this medicine and for 6 months after stopping it. There is a potential for serious side effects to an unborn child. Talk to your health care professional or pharmacist for more information. Do not breast-feed an infant while taking this medicine. This medicine has caused ovarian failure in some women and reduced sperm counts in some men This medicine may interfere with the ability to have a child. Talk with your doctor or health care professional if you are concerned about your fertility. What side effects may I notice from receiving this medicine? Side effects that you should report to your doctor or health care professional as soon as possible: -allergic reactions like skin rash, itching or hives, swelling of the face, lips, or tongue -breathing problems -chest pain -fast or irregular heartbeat -low blood counts - this medicine may  decrease the number of white blood cells, red blood cells and platelets. You may be at increased risk for infections and bleeding. -pain, redness, or irritation at site where injected -signs of infection - fever or chills, cough, sore throat, pain or difficulty passing urine -signs of decreased platelets or bleeding - bruising, pinpoint red spots on the skin, black, tarry stools, blood in the urine -swelling of the ankles, feet, hands -tiredness -weakness Side effects that usually do not require medical attention (report to your doctor or health care professional if they continue or are bothersome): -diarrhea -hair loss -mouth sores -nail discoloration or damage -nausea -red colored urine -vomiting This list may not describe all possible side effects. Call your doctor for medical advice about side effects. You may report side effects to FDA at 1-800-FDA-1088. Where should I keep my medicine? This drug is given in a hospital or clinic and will not be stored at home. NOTE: This sheet is a summary. It may not cover all possible information. If you have questions about this medicine, talk to your doctor, pharmacist, or health care provider.  2018 Elsevier/Gold Standard (2015-04-25 11:28:51)  Rituximab injection What is this medicine? RITUXIMAB (ri TUX i mab) is a monoclonal antibody. It is used to treat certain types of cancer like non-Hodgkin lymphoma and chronic lymphocytic leukemia. It is also  used to treat rheumatoid arthritis, granulomatosis with polyangiitis (or Wegener's granulomatosis), and microscopic polyangiitis. This medicine may be used for other purposes; ask your health care provider or pharmacist if you have questions. COMMON BRAND NAME(S): Rituxan What should I tell my health care provider before I take this medicine? They need to know if you have any of these conditions: -heart disease -infection (especially a virus infection such as hepatitis B, chickenpox, cold sores, or  herpes) -immune system problems -irregular heartbeat -kidney disease -lung or breathing disease, like asthma -recently received or scheduled to receive a vaccine -an unusual or allergic reaction to rituximab, mouse proteins, other medicines, foods, dyes, or preservatives -pregnant or trying to get pregnant -breast-feeding How should I use this medicine? This medicine is for infusion into a vein. It is administered in a hospital or clinic by a specially trained health care professional. A special MedGuide will be given to you by the pharmacist with each prescription and refill. Be sure to read this information carefully each time. Talk to your pediatrician regarding the use of this medicine in children. This medicine is not approved for use in children. Overdosage: If you think you have taken too much of this medicine contact a poison control center or emergency room at once. NOTE: This medicine is only for you. Do not share this medicine with others. What if I miss a dose? It is important not to miss a dose. Call your doctor or health care professional if you are unable to keep an appointment. What may interact with this medicine? -cisplatin -other medicines for arthritis like disease modifying antirheumatic drugs or tumor necrosis factor inhibitors -live virus vaccines This list may not describe all possible interactions. Give your health care provider a list of all the medicines, herbs, non-prescription drugs, or dietary supplements you use. Also tell them if you smoke, drink alcohol, or use illegal drugs. Some items may interact with your medicine. What should I watch for while using this medicine? Your condition will be monitored carefully while you are receiving this medicine. You may need blood work done while you are taking this medicine. This medicine can cause serious allergic reactions. To reduce your risk you may need to take medicine before treatment with this medicine. Take your  medicine as directed. In some patients, this medicine may cause a serious brain infection that may cause death. If you have any problems seeing, thinking, speaking, walking, or standing, tell your doctor right away. If you cannot reach your doctor, urgently seek other source of medical care. Call your doctor or health care professional for advice if you get a fever, chills or sore throat, or other symptoms of a cold or flu. Do not treat yourself. This drug decreases your body's ability to fight infections. Try to avoid being around people who are sick. Do not become pregnant while taking this medicine or for 12 months after stopping it. Women should inform their doctor if they wish to become pregnant or think they might be pregnant. There is a potential for serious side effects to an unborn child. Talk to your health care professional or pharmacist for more information. What side effects may I notice from receiving this medicine? Side effects that you should report to your doctor or health care professional as soon as possible: -breathing problems -chest pain -dizziness or feeling faint -fast, irregular heartbeat -low blood counts - this medicine may decrease the number of white blood cells, red blood cells and platelets. You may be at  increased risk for infections and bleeding. -mouth sores -redness, blistering, peeling or loosening of the skin, including inside the mouth (this can be added for any serious or exfoliative rash that could lead to hospitalization) -signs of infection - fever or chills, cough, sore throat, pain or difficulty passing urine -signs and symptoms of kidney injury like trouble passing urine or change in the amount of urine -signs and symptoms of liver injury like dark yellow or brown urine; general ill feeling or flu-like symptoms; light-colored stools; loss of appetite; nausea; right upper belly pain; unusually weak or tired; yellowing of the eyes or skin -stomach  pain -vomiting Side effects that usually do not require medical attention (report to your doctor or health care professional if they continue or are bothersome): -headache -joint pain -muscle cramps or muscle pain This list may not describe all possible side effects. Call your doctor for medical advice about side effects. You may report side effects to FDA at 1-800-FDA-1088. Where should I keep my medicine? This drug is given in a hospital or clinic and will not be stored at home. NOTE: This sheet is a summary. It may not cover all possible information. If you have questions about this medicine, talk to your doctor, pharmacist, or health care provider.  2018 Elsevier/Gold Standard (2015-10-05 15:28:09)  Cyclophosphamide injection What is this medicine? CYCLOPHOSPHAMIDE (sye kloe FOSS fa mide) is a chemotherapy drug. It slows the growth of cancer cells. This medicine is used to treat many types of cancer like lymphoma, myeloma, leukemia, breast cancer, and ovarian cancer, to name a few. This medicine may be used for other purposes; ask your health care provider or pharmacist if you have questions. COMMON BRAND NAME(S): Cytoxan, Neosar What should I tell my health care provider before I take this medicine? They need to know if you have any of these conditions: -blood disorders -history of other chemotherapy -infection -kidney disease -liver disease -recent or ongoing radiation therapy -tumors in the bone marrow -an unusual or allergic reaction to cyclophosphamide, other chemotherapy, other medicines, foods, dyes, or preservatives -pregnant or trying to get pregnant -breast-feeding How should I use this medicine? This drug is usually given as an injection into a vein or muscle or by infusion into a vein. It is administered in a hospital or clinic by a specially trained health care professional. Talk to your pediatrician regarding the use of this medicine in children. Special care may be  needed. Overdosage: If you think you have taken too much of this medicine contact a poison control center or emergency room at once. NOTE: This medicine is only for you. Do not share this medicine with others. What if I miss a dose? It is important not to miss your dose. Call your doctor or health care professional if you are unable to keep an appointment. What may interact with this medicine? This medicine may interact with the following medications: -amiodarone -amphotericin B -azathioprine -certain antiviral medicines for HIV or AIDS such as protease inhibitors (e.g., indinavir, ritonavir) and zidovudine -certain blood pressure medications such as benazepril, captopril, enalapril, fosinopril, lisinopril, moexipril, monopril, perindopril, quinapril, ramipril, trandolapril -certain cancer medications such as anthracyclines (e.g., daunorubicin, doxorubicin), busulfan, cytarabine, paclitaxel, pentostatin, tamoxifen, trastuzumab -certain diuretics such as chlorothiazide, chlorthalidone, hydrochlorothiazide, indapamide, metolazone -certain medicines that treat or prevent blood clots like warfarin -certain muscle relaxants such as succinylcholine -cyclosporine -etanercept -indomethacin -medicines to increase blood counts like filgrastim, pegfilgrastim, sargramostim -medicines used as general anesthesia -metronidazole -natalizumab This list may not describe  all possible interactions. Give your health care provider a list of all the medicines, herbs, non-prescription drugs, or dietary supplements you use. Also tell them if you smoke, drink alcohol, or use illegal drugs. Some items may interact with your medicine. What should I watch for while using this medicine? Visit your doctor for checks on your progress. This drug may make you feel generally unwell. This is not uncommon, as chemotherapy can affect healthy cells as well as cancer cells. Report any side effects. Continue your course of  treatment even though you feel ill unless your doctor tells you to stop. Drink water or other fluids as directed. Urinate often, even at night. In some cases, you may be given additional medicines to help with side effects. Follow all directions for their use. Call your doctor or health care professional for advice if you get a fever, chills or sore throat, or other symptoms of a cold or flu. Do not treat yourself. This drug decreases your body's ability to fight infections. Try to avoid being around people who are sick. This medicine may increase your risk to bruise or bleed. Call your doctor or health care professional if you notice any unusual bleeding. Be careful brushing and flossing your teeth or using a toothpick because you may get an infection or bleed more easily. If you have any dental work done, tell your dentist you are receiving this medicine. You may get drowsy or dizzy. Do not drive, use machinery, or do anything that needs mental alertness until you know how this medicine affects you. Do not become pregnant while taking this medicine or for 1 year after stopping it. Women should inform their doctor if they wish to become pregnant or think they might be pregnant. Men should not father a child while taking this medicine and for 4 months after stopping it. There is a potential for serious side effects to an unborn child. Talk to your health care professional or pharmacist for more information. Do not breast-feed an infant while taking this medicine. This medicine may interfere with the ability to have a child. This medicine has caused ovarian failure in some women. This medicine has caused reduced sperm counts in some men. You should talk with your doctor or health care professional if you are concerned about your fertility. If you are going to have surgery, tell your doctor or health care professional that you have taken this medicine. What side effects may I notice from receiving this  medicine? Side effects that you should report to your doctor or health care professional as soon as possible: -allergic reactions like skin rash, itching or hives, swelling of the face, lips, or tongue -low blood counts - this medicine may decrease the number of white blood cells, red blood cells and platelets. You may be at increased risk for infections and bleeding. -signs of infection - fever or chills, cough, sore throat, pain or difficulty passing urine -signs of decreased platelets or bleeding - bruising, pinpoint red spots on the skin, black, tarry stools, blood in the urine -signs of decreased red blood cells - unusually weak or tired, fainting spells, lightheadedness -breathing problems -dark urine -dizziness -palpitations -swelling of the ankles, feet, hands -trouble passing urine or change in the amount of urine -weight gain -yellowing of the eyes or skin Side effects that usually do not require medical attention (report to your doctor or health care professional if they continue or are bothersome): -changes in nail or skin color -hair loss -missed  menstrual periods -mouth sores -nausea, vomiting This list may not describe all possible side effects. Call your doctor for medical advice about side effects. You may report side effects to FDA at 1-800-FDA-1088. Where should I keep my medicine? This drug is given in a hospital or clinic and will not be stored at home. NOTE: This sheet is a summary. It may not cover all possible information. If you have questions about this medicine, talk to your doctor, pharmacist, or health care provider.  2018 Elsevier/Gold Standard (2012-01-11 16:22:58)  Vincristine injection What is this medicine? VINCRISTINE (vin KRIS teen) is a chemotherapy drug. It slows the growth of cancer cells. This medicine is used to treat many types of cancer like Hodgkin's disease, leukemia, non-Hodgkin's lymphoma, neuroblastoma (brain cancer), rhabdomyosarcoma, and  Wilms' tumor. This medicine may be used for other purposes; ask your health care provider or pharmacist if you have questions. COMMON BRAND NAME(S): Oncovin, Vincasar PFS What should I tell my health care provider before I take this medicine? They need to know if you have any of these conditions: -blood disorders -gout -infection (especially chickenpox, cold sores, or herpes) -kidney disease -liver disease -lung disease -nervous system disease like Charcot-Marie-Tooth (CMT) -recent or ongoing radiation therapy -an unusual or allergic reaction to vincristine, other chemotherapy agents, other medicines, foods, dyes, or preservatives -pregnant or trying to get pregnant -breast-feeding How should I use this medicine? This drug is given as an infusion into a vein. It is administered in a hospital or clinic by a specially trained health care professional. If you have pain, swelling, burning, or any unusual feeling around the site of your injection, tell your health care professional right away. Talk to your pediatrician regarding the use of this medicine in children. While this drug may be prescribed for selected conditions, precautions do apply. Overdosage: If you think you have taken too much of this medicine contact a poison control center or emergency room at once. NOTE: This medicine is only for you. Do not share this medicine with others. What if I miss a dose? It is important not to miss your dose. Call your doctor or health care professional if you are unable to keep an appointment. What may interact with this medicine? Do not take this medicine with any of the following medications: -itraconazole -mibefradil -voriconazole This medicine may also interact with the following medications: -cyclosporine -erythromycin -fluconazole -ketoconazole -medicines for HIV like delavirdine, efavirenz, nevirapine -medicines for seizures like ethotoin, fosphenotoin, phenytoin -medicines to  increase blood counts like filgrastim, pegfilgrastim, sargramostim -other chemotherapy drugs like cisplatin, L-asparaginase, methotrexate, mitomycin, paclitaxel -pegaspargase -vaccines -zalcitabine, ddC Talk to your doctor or health care professional before taking any of these medicines: -acetaminophen -aspirin -ibuprofen -ketoprofen -naproxen This list may not describe all possible interactions. Give your health care provider a list of all the medicines, herbs, non-prescription drugs, or dietary supplements you use. Also tell them if you smoke, drink alcohol, or use illegal drugs. Some items may interact with your medicine. What should I watch for while using this medicine? Your condition will be monitored carefully while you are receiving this medicine. You will need important blood work done while you are taking this medicine. This drug may make you feel generally unwell. This is not uncommon, as chemotherapy can affect healthy cells as well as cancer cells. Report any side effects. Continue your course of treatment even though you feel ill unless your doctor tells you to stop. In some cases, you may be given additional medicines  to help with side effects. Follow all directions for their use. Call your doctor or health care professional for advice if you get a fever, chills or sore throat, or other symptoms of a cold or flu. Do not treat yourself. Avoid taking products that contain aspirin, acetaminophen, ibuprofen, naproxen, or ketoprofen unless instructed by your doctor. These medicines may hide a fever. Do not become pregnant while taking this medicine. Women should inform their doctor if they wish to become pregnant or think they might be pregnant. There is a potential for serious side effects to an unborn child. Talk to your health care professional or pharmacist for more information. Do not breast-feed an infant while taking this medicine. Men may have a lower sperm count while taking  this medicine. Talk to your doctor if you plan to father a child. What side effects may I notice from receiving this medicine? Side effects that you should report to your doctor or health care professional as soon as possible: -allergic reactions like skin rash, itching or hives, swelling of the face, lips, or tongue -breathing problems -confusion or changes in emotions or moods -constipation -cough -mouth sores -muscle weakness -nausea and vomiting -pain, swelling, redness or irritation at the injection site -pain, tingling, numbness in the hands or feet -problems with balance, talking, walking -seizures -stomach pain -trouble passing urine or change in the amount of urine Side effects that usually do not require medical attention (report to your doctor or health care professional if they continue or are bothersome): -diarrhea -hair loss -jaw pain -loss of appetite This list may not describe all possible side effects. Call your doctor for medical advice about side effects. You may report side effects to FDA at 1-800-FDA-1088. Where should I keep my medicine? This drug is given in a hospital or clinic and will not be stored at home. NOTE: This sheet is a summary. It may not cover all possible information. If you have questions about this medicine, talk to your doctor, pharmacist, or health care provider.  2018 Elsevier/Gold Standard (2007-11-24 17:17:13)

## 2018-01-17 NOTE — Progress Notes (Signed)
Adriamycin push given through peripheral IV. Patient verbalized that he was aware of and had discussed with Dr. Jana Hakim the risks of receiving Adriamycin through a PIV. During push good blood return noted every minute and patient did not voice any complaints. IV site WNL at completion of push with good blood return noted.

## 2018-01-18 ENCOUNTER — Inpatient Hospital Stay: Payer: Medicare Other

## 2018-01-18 VITALS — BP 116/68 | HR 79 | Temp 97.5°F | Resp 16

## 2018-01-18 DIAGNOSIS — Z5111 Encounter for antineoplastic chemotherapy: Secondary | ICD-10-CM | POA: Diagnosis not present

## 2018-01-18 DIAGNOSIS — C8330 Diffuse large B-cell lymphoma, unspecified site: Secondary | ICD-10-CM

## 2018-01-18 DIAGNOSIS — C8580 Other specified types of non-Hodgkin lymphoma, unspecified site: Principal | ICD-10-CM

## 2018-01-18 MED ORDER — PEGFILGRASTIM-CBQV 6 MG/0.6ML ~~LOC~~ SOSY
PREFILLED_SYRINGE | SUBCUTANEOUS | Status: AC
  Filled 2018-01-18: qty 0.6

## 2018-01-18 MED ORDER — PEGFILGRASTIM-CBQV 6 MG/0.6ML ~~LOC~~ SOSY
6.0000 mg | PREFILLED_SYRINGE | Freq: Once | SUBCUTANEOUS | Status: AC
  Administered 2018-01-18: 6 mg via SUBCUTANEOUS

## 2018-01-18 NOTE — Patient Instructions (Signed)
Pegfilgrastim injection (Udenyca) What is this medicine? PEGFILGRASTIM (PEG fil gra stim) is a long-acting granulocyte colony-stimulating factor that stimulates the growth of neutrophils, a type of white blood cell important in the body's fight against infection. It is used to reduce the incidence of fever and infection in patients with certain types of cancer who are receiving chemotherapy that affects the bone marrow, and to increase survival after being exposed to high doses of radiation. This medicine may be used for other purposes; ask your health care provider or pharmacist if you have questions. COMMON BRAND NAME(S): Neulasta What should I tell my health care provider before I take this medicine? They need to know if you have any of these conditions: -kidney disease -latex allergy -ongoing radiation therapy -sickle cell disease -skin reactions to acrylic adhesives (On-Body Injector only) -an unusual or allergic reaction to pegfilgrastim, filgrastim, other medicines, foods, dyes, or preservatives -pregnant or trying to get pregnant -breast-feeding How should I use this medicine? This medicine is for injection under the skin. If you get this medicine at home, you will be taught how to prepare and give the pre-filled syringe or how to use the On-body Injector. Refer to the patient Instructions for Use for detailed instructions. Use exactly as directed. Tell your healthcare provider immediately if you suspect that the On-body Injector may not have performed as intended or if you suspect the use of the On-body Injector resulted in a missed or partial dose. It is important that you put your used needles and syringes in a special sharps container. Do not put them in a trash can. If you do not have a sharps container, call your pharmacist or healthcare provider to get one. Talk to your pediatrician regarding the use of this medicine in children. While this drug may be prescribed for selected  conditions, precautions do apply. Overdosage: If you think you have taken too much of this medicine contact a poison control center or emergency room at once. NOTE: This medicine is only for you. Do not share this medicine with others. What if I miss a dose? It is important not to miss your dose. Call your doctor or health care professional if you miss your dose. If you miss a dose due to an On-body Injector failure or leakage, a new dose should be administered as soon as possible using a single prefilled syringe for manual use. What may interact with this medicine? Interactions have not been studied. Give your health care provider a list of all the medicines, herbs, non-prescription drugs, or dietary supplements you use. Also tell them if you smoke, drink alcohol, or use illegal drugs. Some items may interact with your medicine. This list may not describe all possible interactions. Give your health care provider a list of all the medicines, herbs, non-prescription drugs, or dietary supplements you use. Also tell them if you smoke, drink alcohol, or use illegal drugs. Some items may interact with your medicine. What should I watch for while using this medicine? You may need blood work done while you are taking this medicine. If you are going to need a MRI, CT scan, or other procedure, tell your doctor that you are using this medicine (On-Body Injector only). What side effects may I notice from receiving this medicine? Side effects that you should report to your doctor or health care professional as soon as possible: -allergic reactions like skin rash, itching or hives, swelling of the face, lips, or tongue -dizziness -fever -pain, redness, or irritation at   site where injected -pinpoint red spots on the skin -red or dark-brown urine -shortness of breath or breathing problems -stomach or side pain, or pain at the shoulder -swelling -tiredness -trouble passing urine or change in the amount of  urine Side effects that usually do not require medical attention (report to your doctor or health care professional if they continue or are bothersome): -bone pain -muscle pain This list may not describe all possible side effects. Call your doctor for medical advice about side effects. You may report side effects to FDA at 1-800-FDA-1088. Where should I keep my medicine? Keep out of the reach of children. Store pre-filled syringes in a refrigerator between 2 and 8 degrees C (36 and 46 degrees F). Do not freeze. Keep in carton to protect from light. Throw away this medicine if it is left out of the refrigerator for more than 48 hours. Throw away any unused medicine after the expiration date. NOTE: This sheet is a summary. It may not cover all possible information. If you have questions about this medicine, talk to your doctor, pharmacist, or health care provider.  2018 Elsevier/Gold Standard (2016-02-23 12:58:03)  

## 2018-01-20 NOTE — Progress Notes (Signed)
[place]  [phone] Fax:(336) [phone]     ID: Michael Allen DOB: [birth year]  MR#: [id number]  VKP#:[id number]  Patient Care Team: Seward Carol, MD as PCP - General (Internal Medicine) [name], MD as Consulting Physician (Urology) , [name], MD as Consulting Physician (Oncology) [name], MD OTHER MD:  CHIEF COMPLAINT: Diffuse large cell non-Hodgkin's lymphoma, germinal center B-cell subtype  CURRENT TREATMENT: CHOP-R chemotherapy   HISTORY OF CURRENT ILLNESS: From the original intake note 12/27/2017:  "The patient was referred to Dr. [name] for renal stones.  Incidentally he was noted to have left scrotal swelling.  This had been present several months and was initially felt to be a hydrocele.  Scrotal ultrasound was performed 12/06/2017.  This found no spermatocele but a solid mass in the left testis upper and lower pole.  Accordingly on 12/20/2017 the patient underwent left inguinal orchiectomy.  The pathology from this procedure (SZ [id number]) showed a diffuse large B cell non-Hodgkin's lymphoma, CD20 positive, CD10 weakly positive, no coexpression of CD5, and most consistent with a germinal center B-cell subtype.  I was called by Dr. [name] earlier today and the patient was brought in for further evaluation."  His subsequent history is as detailed below.  INTERVAL HISTORY: Michael Allen returns today for follow-up of his diffuse large cell B-cell non-Hodgkin's lymphoma accompanied by his wife.  Today is day 8 cycle 1 of 3 planned cycles of CHOP/Rituxan chemotherapy.  He did remarkably well with his first cycle.  He had no nausea, no night sweats, no mouth sores or thrush.  He had a dry feeling on the inside of his upper lip for a couple of days.  He developed some right eye discomfort and was evaluated by Dr. lies who did not feel it was infectious.  He was able to sleep despite the prednisone.  He did not have  unusual fatigue when coming off the prednisone.  He had minimal irritation of his right forearm vein, where he received the chemotherapy.  He also did well with his rituximab, with no allergic symptoms whatsoever.  REVIEW OF SYSTEMS: Michael Allen reports that he is continuing with his usual activities.  He denies unusual headaches, balance problems, or dizziness. There has been no unusual cough, phlegm production, or pleurisy. This been no change in bowel or bladder habits.  He denies unexplained fatigue or unexplained weight loss, bleeding, rash, or fever. A detailed review of systems was otherwise stable.     PAST MEDICAL HISTORY: Past Medical History:  Diagnosis Date  . Chalazion   . Dental crowns present   . GERD (gastroesophageal reflux disease)   . History of kidney stones 10/25/2017   right nonobstructing stone noted on CT ABD/Pelvis  . Hydronephrosis 10/25/2017   Mild to moderate right due to 42m stone right distal, noted on CT abd/pelvis  . Hydroureter, right 10/25/2017   449mstone right distal, noted on CT abd/pelvis  . Impaired fasting glucose   . Migraine    optical  . Plantar fasciitis 2003  . PONV (postoperative nausea and vomiting)    light  . Pure hyperglyceridemia   . Rectal bleeding 2002  . Recurrent spontaneous pneumothorax    due to blebsx2  . Skin cancer of scalp 2011  . Transfusion of blood product refused for religious reason   . Ureteral stone 10/25/2017   52m23mtone right distal, noted on CT abd/pelvis  . Wears glasses     PAST SURGICAL HISTORY: Past Surgical  History:  Procedure Laterality Date  . CHALAZION EXCISION Right    cyst removal , right eye  . COLONOSCOPY    . [place]  2002  . INGUINAL HERNIA REPAIR Bilateral 2005   wire mesh  . LUNG SURGERY     x2  . ORCHIECTOMY Left 12/20/2017   Procedure: ORCHIECTOMY;  Surgeon: [name], MD;  Location: [facility];  Service: Urology;  Laterality: Left;  . TONSILLECTOMY       FAMILY HISTORY Family History  Problem Relation Age of Onset  . Dementia Mother        progressive  . Breast cancer Mother   . Heart Problems Father        pacemaker  . CAD Father   . Heart disease Father   The patient's father died from heart and lung problems in his 36s.  The patient's mother had breast cancer in her 42s, died in her 78s from Alzheimer's disease.  The patient had no brothers, 2 sisters.  One sister has what sounds like lung cancer metastatic to the brain, in her 32s.  One maternal cousin was diagnosed with Ms. to have been breast cancer metastatic to the lung in her 63s.   SOCIAL HISTORY:  The patient retired from [employer] more than 20 years ago.  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   His wife Michael Allen is an education major but mostly worked as a housewife.  Daughter Michael Allen is a "mom", and sign language interpreted as well as very active in the natural path movement.  She lives in [place].  Son Michael Allen lives in [place] where he works for the [employer].  The patient has 2 grandchildren    ADVANCED DIRECTIVES: Please note patient would refuse a blood transfusion even for lifesaving reasons (discussed 12/27/2017   HEALTH MAINTENANCE: Social History   Tobacco Use  . Smoking status: Former Research scientist (life sciences)  . Smokeless tobacco: Never Used  . Tobacco comment: age 13  Substance Use Topics  . Alcohol use: Yes    Comment: occ beer  . Drug use: Never     Colonoscopy: 2015  PSA:  Bone density:   Allergies  Allergen Reactions  . Blood-Group Specific Substance   . Oxycodone   . Penicillins   . Shellfish Allergy Hives    Shell fish and shrimp    Current Outpatient Medications  Medication Sig Dispense Refill  . allopurinol (ZYLOPRIM) 300 MG tablet Take 1 tablet (300 mg total) by mouth daily. 30 tablet 5  . ciprofloxacin (CIPRO) 500 MG tablet Take 1 tablet (500 mg total) by mouth 2 (two) times daily. 30 tablet 0  . ondansetron (ZOFRAN) 8 MG tablet  Take 1 tablet (8 mg total) by mouth 2 (two) times daily as needed for refractory nausea / vomiting. Start on day 3 after cyclophosphamide chemotherapy. 30 tablet 1  . predniSONE (DELTASONE) 20 MG tablet Take 3 tablets (60 mg total) by mouth daily. Take on days 1-5 of chemotherapy. 50 tablet 6  . prochlorperazine (COMPAZINE) 10 MG tablet Take 1 tablet (10 mg total) by mouth every 6 (six) hours as needed (Nausea or vomiting). 30 tablet 6  . tobramycin-dexamethasone (TOBRADEX) ophthalmic solution Place 1 drop into both eyes every 6 (six) hours for 7 days. 5 mL 0  . valACYclovir (VALTREX) 500 MG tablet Take 1 tablet (500 mg total) by mouth 2 (two) times daily. 90 tablet 3   No current facility-administered medications for this visit.  OBJECTIVE: Middle-aged white man in no acute distress  Vitals:   01/24/18 1035  BP: 120/75  Pulse: 85  Resp: 18  Temp: 98.3 F (36.8 C)  SpO2: 99%     Body mass index is 23.43 kg/m.   Wt Readings from Last 3 Encounters:  01/24/18 182 lb 8 oz (82.8 kg)  01/17/18 186 lb (84.4 kg)  01/10/18 183 lb 4.8 oz (83.1 kg)      ECOG FS:0 - Asymptomatic  Sclerae unicteric, pupils round and equal Oropharynx clear and moist No cervical or supraclavicular adenopathy, no axillary or inguinal adenopathy Lungs no rales or rhonchi Heart regular rate and rhythm Abd soft, nontender, positive bowel sounds, no palpable splenomegaly MSK no focal spinal tenderness Neuro: nonfocal, well oriented, appropriate affect   LAB RESULTS:  CMP     Component Value Date/Time   NA 137 01/24/2018 0958   K 4.3 01/24/2018 0958   CL 104 01/24/2018 0958   CO2 26 01/24/2018 0958   GLUCOSE 101 (H) 01/24/2018 0958   BUN 17 01/24/2018 0958   CREATININE 0.82 01/24/2018 0958   CALCIUM 9.1 01/24/2018 0958   PROT 6.7 01/24/2018 0958   ALBUMIN 3.5 01/24/2018 0958   AST 12 (L) 01/24/2018 0958   ALT 11 01/24/2018 0958   ALKPHOS 84 01/24/2018 0958   BILITOT 0.9 01/24/2018 0958    GFRNONAA >60 01/24/2018 0958   GFRAA >60 01/24/2018 0958    No results found for: TOTALPROTELP, ALBUMINELP, A1GS, A2GS, BETS, BETA2SER, GAMS, MSPIKE, SPEI  No results found for: Nils Pyle, [facility]  Lab Results  Component Value Date   WBC 1.2 (L) 01/24/2018   NEUTROABS 0.2 (LL) 01/24/2018   HGB 14.1 01/24/2018   HCT 42.6 01/24/2018   MCV 90.6 01/24/2018   PLT 99 (L) 01/24/2018      Chemistry      Component Value Date/Time   NA 137 01/24/2018 0958   K 4.3 01/24/2018 0958   CL 104 01/24/2018 0958   CO2 26 01/24/2018 0958   BUN 17 01/24/2018 0958   CREATININE 0.82 01/24/2018 0958      Component Value Date/Time   CALCIUM 9.1 01/24/2018 0958   ALKPHOS 84 01/24/2018 0958   AST 12 (L) 01/24/2018 0958   ALT 11 01/24/2018 0958   BILITOT 0.9 01/24/2018 0958       No results found for: LABCA2  No components found for: [id number]  No results for input(s): INR in the last 168 hours.  No results found for: LABCA2  No results found for: IOE703  No results found for: JKK938  No results found for: HWE993  No results found for: CA2729  No components found for: HGQUANT  No results found for: CEA1 / No results found for: CEA1   No results found for: AFPTUMOR  No results found for: [place]  No results found for: PSA1  Appointment on 01/24/2018  Component Date Value Ref Range Status  . WBC 01/24/2018 1.2* 4.0 - 10.5 K/uL Final  . RBC 01/24/2018 4.70  4.22 - 5.81 MIL/uL Final  . Hemoglobin 01/24/2018 14.1  13.0 - 17.0 g/dL Final  . HCT 01/24/2018 42.6  39.0 - 52.0 % Final  . MCV 01/24/2018 90.6  80.0 - 100.0 fL Final  . MCH 01/24/2018 30.0  26.0 - 34.0 pg Final  . MCHC 01/24/2018 33.1  30.0 - 36.0 g/dL Final  . RDW 01/24/2018 14.7  11.5 - 15.5 % Final  . Platelets 01/24/2018 99* 150 - 400 K/uL Final  .  nRBC 01/24/2018 0.0  0.0 - 0.2 % Final  . Neutrophils Relative % 01/24/2018 16  % Final  . Neutro Abs 01/24/2018 0.2* 1.7 - 7.7 K/uL Final    This critical result has verified and been called to Katheren Puller, RN by Rolland Porter on 11 15 2019 at 1041, and has been read back.   . Lymphocytes Relative 01/24/2018 72  % Final  . Lymphs Abs 01/24/2018 0.9  0.7 - 4.0 K/uL Final  . Monocytes Relative 01/24/2018 4  % Final  . Monocytes Absolute 01/24/2018 0.1  0.1 - 1.0 K/uL Final  . Eosinophils Relative 01/24/2018 5  % Final  . Eosinophils Absolute 01/24/2018 0.1  0.0 - 0.5 K/uL Final  . Basophils Relative 01/24/2018 2  % Final  . Basophils Absolute 01/24/2018 0.0  0.0 - 0.1 K/uL Final  . Immature Granulocytes 01/24/2018 1  % Final  . Abs Immature Granulocytes 01/24/2018 0.01  0.00 - 0.07 K/uL Final   Performed at [facility], [place] [address] [place], [place] [address]  . Sodium 01/24/2018 137  135 - 145 mmol/L Final  . Potassium 01/24/2018 4.3  3.5 - 5.1 mmol/L Final  . Chloride 01/24/2018 104  98 - 111 mmol/L Final  . CO2 01/24/2018 26  22 - 32 mmol/L Final  . Glucose, Bld 01/24/2018 101* 70 - 99 mg/dL Final  . BUN 01/24/2018 17  8 - 23 mg/dL Final  . Creatinine, Ser 01/24/2018 0.82  0.61 - 1.24 mg/dL Final  . Calcium 01/24/2018 9.1  8.9 - 10.3 mg/dL Final  . Total Protein 01/24/2018 6.7  6.5 - 8.1 g/dL Final  . Albumin 01/24/2018 3.5  3.5 - 5.0 g/dL Final  . AST 01/24/2018 12* 15 - 41 U/L Final  . ALT 01/24/2018 11  0 - 44 U/L Final  . Alkaline Phosphatase 01/24/2018 84  38 - 126 U/L Final  . Total Bilirubin 01/24/2018 0.9  0.3 - 1.2 mg/dL Final  . GFR calc non Af Amer 01/24/2018 >60  >60 mL/min Final  . GFR calc Af Amer 01/24/2018 >60  >60 mL/min Final   Comment: (NOTE) The eGFR has been calculated using the CKD EPI equation. This calculation has not been validated in all clinical situations. eGFR's persistently <60 mL/min signify possible Chronic Kidney Disease.   Georgiann Hahn gap 01/24/2018 7  5 - 15 Final   Performed at [facility], [place] [address], [place], [place] [address]    . Uric Acid, Serum 01/24/2018 3.1* 3.7 - 8.6 mg/dL Final   Performed at [facility], [place] [address] [place], [place] [address]  . LDH 01/24/2018 119  98 - 192 U/L Final   Performed at [facility], [place] [address], [place], [place] [address]    (this displays the last labs from the last 3 days)  No results found for: TOTALPROTELP, ALBUMINELP, A1GS, A2GS, BETS, BETA2SER, GAMS, MSPIKE, SPEI (this displays SPEP labs)  No results found for: KPAFRELGTCHN, LAMBDASER, KAPLAMBRATIO (kappa/lambda light chains)  No results found for: HGBA, HGBA2QUANT, HGBFQUANT, HGBSQUAN (Hemoglobinopathy evaluation)   Lab Results  Component Value Date   LDH 119 01/24/2018    No results found for: IRON, TIBC, IRONPCTSAT (Iron and TIBC)  No results found for: FERRITIN  Urinalysis No results found for: COLORURINE, APPEARANCEUR, LABSPEC, PHURINE, GLUCOSEU, HGBUR, BILIRUBINUR, KETONESUR, PROTEINUR, UROBILINOGEN, NITRITE, LEUKOCYTESUR   STUDIES: Nm Pet Image Initial (pi) Skull Base To Thigh  Result Date:  12/31/2017 CLINICAL DATA:  Initial treatment strategy for malignant neoplasm of left testes. Diffuse large B-cell lymphoma. EXAM: NUCLEAR MEDICINE PET SKULL BASE TO THIGH TECHNIQUE: 9.0 mCi F-18 FDG was injected intravenously. Full-ring PET imaging was performed from the skull base to thigh after the radiotracer. CT data was obtained and used for attenuation correction and anatomic localization. Fasting blood glucose: 102 mg/dl COMPARISON:  Abdominopelvic CT 10/25/2017 FINDINGS: Mediastinal blood pool activity: SUV max 2.9 NECK: No cervical nodal hypermetabolism. Right maxillary sinus hypermetabolism corresponds to typical findings of chronic sinusitis, including mucosal thickening and mucous retention cysts or polyps. This measures a S.U.V. max of 17.1. Incidental CT findings: No cervical adenopathy. CHEST: No pulmonary parenchymal or thoracic nodal  hypermetabolism. Incidental CT findings: Mild cardiomegaly. Lad coronary artery calcification. Right paratracheal calcified node is likely due to old granulomatous disease. Posterior right upper lobe calcified granuloma. ABDOMEN/PELVIS: Bilateral adrenal hypermetabolism, without well-defined nodule or mass. Greater on the left, at a S.U.V. max of 3.7. Likely physiologic. There is hypermetabolism in the left inguinal canal which is likely postoperative and corresponds to fluid/soft tissue thickening. The right testicle demonstrates moderate hypermetabolism, including at a S.U.V. max of 4.5. No right testicular mass on prior ultrasound. Incidental CT findings: Posterior gastric diverticulum. Aortic atherosclerosis. Inter/lower pole 4 mm right renal collecting system calculus. Hepatic cysts. Increased density in the small bowel mesentery with small mesenteric nodes. No abdominopelvic adenopathy. The proximal right common iliac is borderline ectatic at 1.5 cm. Prior bilateral inguinal hernia repair. Moderate prostatomegaly. SKELETON: No abnormal marrow activity. Incidental CT findings: Remote left rib fractures. IMPRESSION: 1. No findings of residual hypermetabolic lymphoma. 2. Hypermetabolism within the right testicle is favored to be physiologic. No right-sided mass on prior ultrasound. 3. Right maxillary sinus hypermetabolism is likely related to chronic sinusitis. 4. Small bowel mesenteric findings which suggest mesenteric adenitis/panniculitis. Of indeterminate clinical significance. 5. Coronary artery atherosclerosis. Aortic Atherosclerosis (ICD10-I70.0). 6. Right nephrolithiasis. Electronically Signed   By: Abigail Miyamoto M.D.   On: 12/31/2017 09:52     ELIGIBLE FOR AVAILABLE RESEARCH PROTOCOL: no  ASSESSMENT: 72 y.o. Jehovah's Witness status post left inguinal orchiectomy 12/20/2017 for a diffuse large cell non-Hodgkin's lymphoma, germinal center B-cell subtype, CD20 positive, CD5 negative.  (1) staging  studies:  (a) PET scan 12/31/2017: Uptake only in the right testicle and panniculus, both of questionable significance  (b) bone marrow biopsy 01/01/2018 shows no evidence of lymphoma  (c) lumbar puncture planned for 02/10/2018  (2) international prognostic index: 2 =80% predicted PFS4; stage I (or II if panniculus involved)  (3) cyclophosphamide, doxorubicin, vincristine, prednisone, rituximab to started 01/16/2018  (a) plan is for 3 cycles of CHOP-R, then adjuvant radiation therapy   PLAN: Michael Allen did remarkably well with his first cycle of CHOP right toxin.  We are using peripheral veins since he will receive only 3 cycles.  He did have mild phlebitis.  Otherwise he essentially had no symptoms or side effects related to his treatment  He is now significantly neutropenic.  We are going to prophylax with ciprofloxacin for the next 5 days.  He understands that if he develops a temperature of 100 degrees or more or any other new symptoms he is to call us immediately.  They are planning a trip to [place], leaving 01/22/2018 and returning 02/08/2012.  Accordingly his next treatment will be 02/11/2018.  He will see me the day before with lab work and he will have his lumbar puncture also on 02/10/2018.  He is scheduled to  meet with Dr. Sondra Come 02/17/2018 to discuss adjuvant radiation  They know to call for any other issues that may develop before the next visit here.  , [name], MD  01/24/18 4:15 PM Medical Oncology and Hematology [facility] [address] [place], [place] [address] Tel. [phone]    Fax. [phone]    Elie Goody, am acting as scribe for Dr. [name]. .  I, Lurline Del MD, have reviewed the above documentation for accuracy and completeness, and I agree with the above.

## 2018-01-23 ENCOUNTER — Telehealth: Payer: Self-pay | Admitting: *Deleted

## 2018-01-23 MED ORDER — TOBRAMYCIN-DEXAMETHASONE 0.3-0.1 % OP SUSP: 1.0000 [drp] | Freq: Four times a day (QID) | OPHTHALMIC | 0 refills | Status: AC

## 2018-01-23 NOTE — Telephone Encounter (Signed)
This RN spoke with Michael Allen- she states they did go see the eye doctor - and presently does not feel the irritation is pink eye at this time.  This RN discussed eye drops sent to pharmacy - not to pick up until after MD appointment tomorrow at this office.  Michael Allen verbalized understanding.

## 2018-01-23 NOTE — Telephone Encounter (Signed)
Received call from wife reporting that pt developed pink eye yesterday and very painful.  Wife wanted to know what to do. Spoke with wife and instructed wife to call pt's ophthalmologist for further evaluation.   Noted that pt received chemo on 11/8 , and Udenyca on 11/9.  Pt has office visit with Dr. Jana Hakim on 01/24/18. Wife understood to call office back with further questions and/or suggestions from eye doctor. Pt's   Phone     [phone].

## 2018-01-24 ENCOUNTER — Inpatient Hospital Stay: Payer: Medicare Other

## 2018-01-24 ENCOUNTER — Inpatient Hospital Stay (HOSPITAL_BASED_OUTPATIENT_CLINIC_OR_DEPARTMENT_OTHER): Payer: Medicare Other | Admitting: Oncology

## 2018-01-24 VITALS — BP 120/75 | HR 85 | Temp 98.3°F | Resp 18 | Ht 74.0 in | Wt 182.5 lb

## 2018-01-24 DIAGNOSIS — I700 Atherosclerosis of aorta: Secondary | ICD-10-CM

## 2018-01-24 DIAGNOSIS — T451X5A Adverse effect of antineoplastic and immunosuppressive drugs, initial encounter: Secondary | ICD-10-CM

## 2018-01-24 DIAGNOSIS — D701 Agranulocytosis secondary to cancer chemotherapy: Secondary | ICD-10-CM | POA: Diagnosis not present

## 2018-01-24 DIAGNOSIS — Z85828 Personal history of other malignant neoplasm of skin: Secondary | ICD-10-CM | POA: Diagnosis not present

## 2018-01-24 DIAGNOSIS — C8580 Other specified types of non-Hodgkin lymphoma, unspecified site: Secondary | ICD-10-CM

## 2018-01-24 DIAGNOSIS — D702 Other drug-induced agranulocytosis: Secondary | ICD-10-CM

## 2018-01-24 DIAGNOSIS — C8585 Other specified types of non-Hodgkin lymphoma, lymph nodes of inguinal region and lower limb: Secondary | ICD-10-CM

## 2018-01-24 DIAGNOSIS — Z803 Family history of malignant neoplasm of breast: Secondary | ICD-10-CM

## 2018-01-24 DIAGNOSIS — J8410 Pulmonary fibrosis, unspecified: Secondary | ICD-10-CM

## 2018-01-24 DIAGNOSIS — N1330 Unspecified hydronephrosis: Secondary | ICD-10-CM

## 2018-01-24 DIAGNOSIS — Z5111 Encounter for antineoplastic chemotherapy: Secondary | ICD-10-CM | POA: Diagnosis not present

## 2018-01-24 DIAGNOSIS — K5730 Diverticulosis of large intestine without perforation or abscess without bleeding: Secondary | ICD-10-CM

## 2018-01-24 DIAGNOSIS — C8330 Diffuse large B-cell lymphoma, unspecified site: Secondary | ICD-10-CM

## 2018-01-24 DIAGNOSIS — N200 Calculus of kidney: Secondary | ICD-10-CM

## 2018-01-24 LAB — CBC WITH DIFFERENTIAL/PLATELET
Abs Immature Granulocytes: 0.01 10*3/uL (ref 0.00–0.07)
BASOS ABS: 0 10*3/uL (ref 0.0–0.1)
Basophils Relative: 2 %
EOS ABS: 0.1 10*3/uL (ref 0.0–0.5)
Eosinophils Relative: 5 %
HEMATOCRIT: 42.6 % (ref 39.0–52.0)
Hemoglobin: 14.1 g/dL (ref 13.0–17.0)
IMMATURE GRANULOCYTES: 1 %
LYMPHS ABS: 0.9 10*3/uL (ref 0.7–4.0)
LYMPHS PCT: 72 %
MCH: 30 pg (ref 26.0–34.0)
MCHC: 33.1 g/dL (ref 30.0–36.0)
MCV: 90.6 fL (ref 80.0–100.0)
Monocytes Absolute: 0.1 10*3/uL (ref 0.1–1.0)
Monocytes Relative: 4 %
NEUTROS PCT: 16 %
NRBC: 0 % (ref 0.0–0.2)
Neutro Abs: 0.2 10*3/uL — CL (ref 1.7–7.7)
PLATELETS: 99 10*3/uL — AB (ref 150–400)
RBC: 4.7 MIL/uL (ref 4.22–5.81)
RDW: 14.7 % (ref 11.5–15.5)
WBC: 1.2 10*3/uL — AB (ref 4.0–10.5)

## 2018-01-24 LAB — URIC ACID: URIC ACID, SERUM: 3.1 mg/dL — AB (ref 3.7–8.6)

## 2018-01-24 LAB — COMPREHENSIVE METABOLIC PANEL
ALBUMIN: 3.5 g/dL (ref 3.5–5.0)
ALT: 11 U/L (ref 0–44)
AST: 12 U/L — AB (ref 15–41)
Alkaline Phosphatase: 84 U/L (ref 38–126)
Anion gap: 7 (ref 5–15)
BUN: 17 mg/dL (ref 8–23)
CHLORIDE: 104 mmol/L (ref 98–111)
CO2: 26 mmol/L (ref 22–32)
Calcium: 9.1 mg/dL (ref 8.9–10.3)
Creatinine, Ser: 0.82 mg/dL (ref 0.61–1.24)
GFR calc Af Amer: >60 mL/min (ref >60)
GFR calc non Af Amer: >60 mL/min (ref >60)
GLUCOSE: 101 mg/dL — AB (ref 70–99)
POTASSIUM: 4.3 mmol/L (ref 3.5–5.1)
SODIUM: 137 mmol/L (ref 135–145)
Total Bilirubin: 0.9 mg/dL (ref 0.3–1.2)
Total Protein: 6.7 g/dL (ref 6.5–8.1)

## 2018-01-24 LAB — LACTATE DEHYDROGENASE: LDH: 119 U/L (ref 98–192)

## 2018-01-24 MED ORDER — CIPROFLOXACIN HCL 500 MG PO TABS: 500.0000 mg | ORAL_TABLET | Freq: Two times a day (BID) | ORAL | 0 refills | Status: DC

## 2018-02-08 ENCOUNTER — Ambulatory Visit: Payer: Medicare Other

## 2018-02-09 ENCOUNTER — Encounter: Payer: Self-pay | Admitting: Oncology

## 2018-02-09 NOTE — Progress Notes (Signed)
[place]  [phone] [phone]     ID: Michael Allen DOB: April 15, 1945  MR#: [id number]  DPO#:[id number]  Patient Care Team: Seward Carol, MD as PCP - General (Internal Medicine) [name], MD as Consulting Physician (Urology) Toma Arts, [name], MD as Consulting Physician (Oncology) [name], MD as Consulting Physician (Ophthalmology) [name], MD as Consulting Physician (Radiation Oncology) [name], MD OTHER MD:  CHIEF COMPLAINT: Diffuse large cell non-Hodgkin's lymphoma, germinal center B-cell subtype  CURRENT TREATMENT: CHOP-R chemotherapy   HISTORY OF CURRENT ILLNESS: From the original intake note 12/27/2017:  "The patient was referred to Dr. [name] for renal stones.  Incidentally he was noted to have left scrotal swelling.  This had been present several months and was initially felt to be a hydrocele.  Scrotal ultrasound was performed 12/06/2017.  This found no spermatocele but a solid mass in the left testis upper and lower pole.  Accordingly on 12/20/2017 the patient underwent left inguinal orchiectomy.  The pathology from this procedure (SZ [id number]) showed a diffuse large B cell non-Hodgkin's lymphoma, CD20 positive, CD10 weakly positive, no coexpression of CD5, and most consistent with a germinal center B-cell subtype.  I was called by Dr. [name] earlier today and the patient was brought in for further evaluation."  His subsequent history is as detailed below.  INTERVAL HISTORY: Michael Allen returns today for follow-up of his diffuse large cell B-cell non-Hodgkin's lymphoma.  The patient continues with CHOP/Rituxan chemotherapy. Today is day 25 cycle 1 of 3 planned cycles. He is tolerating well. After the first cycle all of his hair fell out. If he runs his hands through his hair tuff would come out.   Denies B-symptoms: drenching sweat, explained fatigue or weight loss, adenopathy, or fever.  Has a  bit of a "tickle" in the back of his throat. Doesn't associate it to the drugs and may well be allergy.  Since his last visit Michael Allen had PET scan from base of skull  to thigh on 12/31/17 that showed: No findings of residual hypermetabolic lymphoma. Hypermetabolism within the right testicle is favored to be physiologic. No right-sided mass on prior ultrasound. Right maxillary sinus hypermetabolism is likely related to chronic sinusitis. Small bowel mesenteric findings which suggest mesenteric adenitis/panniculitis. Of indeterminate clinical significance. Coronary artery atherosclerosis. Aortic Atherosclerosis (ICD10-I70.0). Right nephrolithiasis.  He also had CT of his abdomen pelvis w contrast on 10/25/17 that showed: 4 mm stone in the distal right ureter causes mild to moderate right hydronephrosis and right hydroureter. No other acute abnormality within the abdomen or pelvis. Small nonobstructing stone in the right kidney.  Lumbar puncture has been ordered but likely because he was out of town until just after Thanksgiving's it has not yet been scheduled.   REVIEW OF SYSTEMS:  Michael Allen returned form [place] 02/07/2018, his 2 kids and their families stayed with them as well. He denies unusual headaches, visual changes, nausea, vomiting, or dizziness. There has been no unusual phlegm production, or pleurisy. This been no change in bowel or bladder habits. She denies unexplained fatigue or unexplained weight loss, bleeding, rash, or fever. A detailed review of systems was otherwise stable.  PAST MEDICAL HISTORY: Past Medical History:  Diagnosis Date  . Chalazion   . Dental crowns present   . GERD (gastroesophageal reflux disease)   . History of kidney stones 10/25/2017   right nonobstructing stone noted on CT ABD/Pelvis  . Hydronephrosis 10/25/2017   Mild to moderate right due to 50m stone  right distal, noted on CT abd/pelvis  . Hydroureter, right 10/25/2017   55m stone right distal, noted on CT  abd/pelvis  . Impaired fasting glucose   . Migraine    optical  . Plantar fasciitis 2003  . PONV (postoperative nausea and vomiting)    light  . Pure hyperglyceridemia   . Rectal bleeding 2002  . Recurrent spontaneous pneumothorax    due to blebsx2  . Skin cancer of scalp 2011  . Transfusion of blood product refused for religious reason   . Ureteral stone 10/25/2017   460mstone right distal, noted on CT abd/pelvis  . Wears glasses     PAST SURGICAL HISTORY: Past Surgical History:  Procedure Laterality Date  . CHALAZION EXCISION Right    cyst removal , right eye  . [place]    [place]  . INGUINAL HERNIA REPAIR Bilateral 2005   wire mesh  . LUNG SURGERY     x2  . ORCHIECTOMY Left 12/20/2017   Procedure: ORCHIECTOMY;  Surgeon: DaFranchot GalloMD;  [facility] Service: Urology;  Laterality: Left;  . TONSILLECTOMY      FAMILY HISTORY Family History  Problem Relation Age of Onset  . Dementia Mother        progressive  . Breast cancer Mother   . Heart Problems Father        pacemaker  . CAD Father   . Heart disease Father   The patient's father died from heart and lung problems in his 8025s The patient's mother had breast cancer in her 5056sdied in her 9021srom Alzheimer's disease.  The patient had no brothers, 2 sisters.  One sister has what sounds like lung cancer metastatic to the brain, in her 7011s One maternal cousin was diagnosed with Ms. to have been breast cancer metastatic to the lung in her 5034s  SOCIAL HISTORY:  The patient retired from [employer] than 20 years ago.  He is very active in the [employer] ministry.   His wife NaBonnita Nasutis an education major but mostly worked as a housewife.  Daughter [name] a "mom", and sign language interpreted as well as very active in the natural path movement.  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 Smokeless tobacco: Never Used  . Tobacco comment: age [age]Substance Use Topics  . Alcohol use: Yes    Comment: occ beer  . Drug use: Never     Colonoscopy: 2015  PSA:  Bone density:   Allergies  Allergen Reactions  . Blood-Group Specific Substance   . Oxycodone   . Penicillins   . Shellfish Allergy Hives    Shell fish and shrimp    Current Outpatient Medications  Medication Sig Dispense Refill  . allopurinol (ZYLOPRIM) 300 MG tablet Take 1 tablet (300 mg total) by mouth daily. 30 tablet 5  . ciprofloxacin (CIPRO) 500 MG tablet Take 1 tablet (500 mg total) by mouth 2 (two) times daily. 30 tablet 0  . ondansetron (ZOFRAN) 8 MG tablet Take 1 tablet (8 mg total) by mouth 2 (two) times daily as needed for refractory nausea /  vomiting. Start on day 3 after cyclophosphamide chemotherapy. 30 tablet 1  . predniSONE (DELTASONE) 20 MG tablet Take 3 tablets (60 mg total) by mouth daily. Take on days 1-5 of chemotherapy. 50 tablet 6  . prochlorperazine (COMPAZINE) 10 MG tablet Take 1 tablet (10 mg total) by mouth every 6 (six) hours as needed (Nausea or vomiting). 30 tablet 6  . valACYclovir (VALTREX) 500 MG tablet Take 1 tablet (500 mg total) by mouth 2 (two) times daily. 90 tablet 3   No current facility-administered medications for this visit.     OBJECTIVE: Middle-aged white man who appears stated age  26:   02/10/18 1229  BP: 110/65  Pulse: 74  Resp: 18  Temp: (!) 97.4 F (36.3 C)  SpO2: 98%     Body mass index is 23.61 kg/m.   Wt Readings from Last 3 Encounters:  02/10/18 183 lb 14.4 oz (83.4 kg)  01/24/18 182 lb 8 oz (82.8 kg)  01/17/18 186 lb (84.4 kg)      ECOG FS:0 - Asymptomatic  Sclerae unicteric, EOMs intact Oropharynx  clear and moist No cervical or supraclavicular adenopathy, no cervical adenopathy  lungs no rales or rhonchi Heart regular rate and rhythm Abd soft, nontender, positive bowel sounds MSK no focal spinal tenderness, no upper extremity lymphedema Neuro: nonfocal, well oriented, appropriate affect    LAB RESULTS:  CMP     Component Value Date/Time   NA 141 02/10/2018 1213   K 4.2 02/10/2018 1213   CL 107 02/10/2018 1213   CO2 27 02/10/2018 1213   GLUCOSE 97 02/10/2018 1213   BUN 15 02/10/2018 1213   CREATININE 0.85 02/10/2018 1213   CALCIUM 9.1 02/10/2018 1213   PROT 7.1 02/10/2018 1213   ALBUMIN 3.7 02/10/2018 1213   AST 19 02/10/2018 1213   ALT 13 02/10/2018 1213   ALKPHOS 82 02/10/2018 1213   BILITOT 0.3 02/10/2018 1213   GFRNONAA >60 02/10/2018 1213   GFRAA >60 02/10/2018 1213    No results found for: TOTALPROTELP, ALBUMINELP, A1GS, A2GS, BETS, BETA2SER, GAMS, MSPIKE, SPEI  No results found for: Nils Pyle, [facility]  Lab Results  Component Value Date   WBC 9.8 02/10/2018   NEUTROABS 6.8 02/10/2018   HGB 13.8 02/10/2018   HCT 42.1 02/10/2018   MCV 92.9 02/10/2018   PLT 370 02/10/2018      Chemistry      Component Value Date/Time   NA 141 02/10/2018 1213   K 4.2 02/10/2018 1213   CL 107 02/10/2018 1213   CO2 27 02/10/2018 1213   BUN 15 02/10/2018 1213   CREATININE 0.85 02/10/2018 1213      Component Value Date/Time   CALCIUM 9.1 02/10/2018 1213   ALKPHOS 82 02/10/2018 1213   AST 19 02/10/2018 1213   ALT 13 02/10/2018 1213   BILITOT 0.3 02/10/2018 1213       No results found for: LABCA2  No components found for: QGBEEF007  No results for input(s): INR in the last 168 hours.  No results found for: LABCA2  No results found for: HQR975  No results found for: OIT254  No results found for: DIY641  No results found for: CA2729  No components found for: HGQUANT  No results found for: CEA1 / No results found for: CEA1   No  results found for: AFPTUMOR  No results found for: CHROMOGRNA  No results found for: PSA1  Appointment on 02/10/2018  Component Date Value Ref Range Status  . Uric Acid,  Serum 02/10/2018 2.9* 3.7 - 8.6 mg/dL Final   Performed at [facility], [place] [address] [place], [place] [address]  . Sodium 02/10/2018 141  135 - 145 mmol/L Final  . Potassium 02/10/2018 4.2  3.5 - 5.1 mmol/L Final  . Chloride 02/10/2018 107  98 - 111 mmol/L Final  . CO2 02/10/2018 27  22 - 32 mmol/L Final  . Glucose, Bld 02/10/2018 97  70 - 99 mg/dL Final  . BUN 02/10/2018 15  8 - 23 mg/dL Final  . Creatinine, Ser 02/10/2018 0.85  0.61 - 1.24 mg/dL Final  . Calcium 02/10/2018 9.1  8.9 - 10.3 mg/dL Final  . Total Protein 02/10/2018 7.1  6.5 - 8.1 g/dL Final  . Albumin 02/10/2018 3.7  3.5 - 5.0 g/dL Final  . AST 02/10/2018 19  15 - 41 U/L Final  . ALT 02/10/2018 13  0 - 44 U/L Final  . Alkaline Phosphatase 02/10/2018 82  38 - 126 U/L Final  . Total Bilirubin 02/10/2018 0.3  0.3 - 1.2 mg/dL Final  . GFR calc non Af Amer 02/10/2018 >60  >60 mL/min Final  . GFR calc Af Amer 02/10/2018 >60  >60 mL/min Final  . Anion gap 02/10/2018 7  5 - 15 Final   Performed at [facility], [place] [address] [place], [place] [address]  . WBC 02/10/2018 9.8  4.0 - 10.5 K/uL Final  . RBC 02/10/2018 4.53  4.22 - 5.81 MIL/uL Final  . Hemoglobin 02/10/2018 13.8  13.0 - 17.0 g/dL Final  . HCT 02/10/2018 42.1  39.0 - 52.0 % Final  . MCV 02/10/2018 92.9  80.0 - 100.0 fL Final  . MCH 02/10/2018 30.5  26.0 - 34.0 pg Final  . MCHC 02/10/2018 32.8  30.0 - 36.0 g/dL Final  . RDW 02/10/2018 16.3* 11.5 - 15.5 % Final  . Platelets 02/10/2018 370  150 - 400 K/uL Final  . nRBC 02/10/2018 0.0  0.0 - 0.2 % Final  . Neutrophils Relative % 02/10/2018 69  % Final  . Neutro Abs 02/10/2018 6.8  1.7 - 7.7 K/uL Final  . Lymphocytes Relative 02/10/2018 16  % Final  . Lymphs Abs 02/10/2018 1.5  0.7 - 4.0 K/uL  Final  . Monocytes Relative 02/10/2018 12  % Final  . Monocytes Absolute 02/10/2018 1.2* 0.1 - 1.0 K/uL Final  . Eosinophils Relative 02/10/2018 1  % Final  . Eosinophils Absolute 02/10/2018 0.1  0.0 - 0.5 K/uL Final  . Basophils Relative 02/10/2018 1  % Final  . Basophils Absolute 02/10/2018 0.1  0.0 - 0.1 K/uL Final  . Immature Granulocytes 02/10/2018 1  % Final  . Abs Immature Granulocytes 02/10/2018 0.13* 0.00 - 0.07 K/uL Final   Performed at [facility], [place] [address], [place], [place] [address]    (this displays the last labs from the last 3 days)  No results found for: TOTALPROTELP, ALBUMINELP, A1GS, A2GS, BETS, BETA2SER, GAMS, MSPIKE, SPEI (this displays SPEP labs)  No results found for: KPAFRELGTCHN, LAMBDASER, KAPLAMBRATIO (kappa/lambda light chains)  No results found for: HGBA, HGBA2QUANT, HGBFQUANT, HGBSQUAN (Hemoglobinopathy evaluation)   Lab Results  Component Value Date   LDH 119 01/24/2018    No results found for: IRON, TIBC, IRONPCTSAT (Iron and TIBC)  No results found for: FERRITIN  Urinalysis No results found for: COLORURINE, APPEARANCEUR, LABSPEC, PHURINE, GLUCOSEU, HGBUR, BILIRUBINUR, KETONESUR, PROTEINUR, UROBILINOGEN, NITRITE, LEUKOCYTESUR   STUDIES: No results found.   ELIGIBLE FOR  AVAILABLE RESEARCH PROTOCOL: no  ASSESSMENT: 72 y.o. Jehovah's Witness status post left inguinal orchiectomy 12/20/2017 for a diffuse large cell non-Hodgkin's lymphoma, germinal center B-cell subtype, CD20 positive, CD5 negative.  (1) staging studies:  (a) PET scan 12/31/2017: Uptake only in the right testicle and panniculus, both of questionable significance  (b) bone marrow biopsy 01/01/2018 shows no evidence of lymphoma  (c) lumbar puncture planned for 02/10/2018  (2) international prognostic index: 2 =80% predicted PFS4; stage I (or II if panniculus involved)  (3) cyclophosphamide, doxorubicin, vincristine, prednisone, rituximab to  started 01/16/2018  (a) plan is for 3 cycles of CHOP-R, then adjuvant radiation therapy   PLAN: Michael Allen will proceed to cycle 2 of 3 planned of CHOP right toxin tomorrow.  He generally did quite well with the first cycle and should do equally well with the remaining 2.  We had hoped he would have had his lumbar puncture already, but since that was not done we are going to have to wait until shortly before cycle 3, since before that his counts might be low.  Certainly by December 20 or so his counts will have fully recovered.  I do not think he needs any antibiotics for his cough.  I suggested some warm liquids and Robitussin.  He is already scheduled to meet with Dr. Sondra Come and will discuss adjuvant radiation with him at that time.  I expect that we will start around the middle of January or so  He knows to call for any problems that may develop before his next visit here     Freyja Govea, [name], MD  02/10/18 1:26 PM Medical Oncology and Hematology [facility] [place], [place] [address] Tel. [phone]    Fax. [phone]    Elie Goody, am acting as scribe for Dr. [name]. Jediah Horger.  I, Lurline Del MD, have reviewed the above documentation for accuracy and completeness, and I agree with the above.

## 2018-02-10 ENCOUNTER — Telehealth: Payer: Self-pay | Admitting: Oncology

## 2018-02-10 ENCOUNTER — Inpatient Hospital Stay: Payer: Medicare Other | Attending: Oncology

## 2018-02-10 ENCOUNTER — Other Ambulatory Visit: Payer: Self-pay | Admitting: Oncology

## 2018-02-10 ENCOUNTER — Inpatient Hospital Stay: Payer: Medicare Other | Admitting: Oncology

## 2018-02-10 VITALS — BP 110/65 | HR 74 | Temp 97.4°F | Resp 18 | Ht 74.0 in | Wt 183.9 lb

## 2018-02-10 DIAGNOSIS — C8335 Diffuse large B-cell lymphoma, lymph nodes of inguinal region and lower limb: Secondary | ICD-10-CM | POA: Insufficient documentation

## 2018-02-10 DIAGNOSIS — C8330 Diffuse large B-cell lymphoma, unspecified site: Secondary | ICD-10-CM

## 2018-02-10 DIAGNOSIS — Z5111 Encounter for antineoplastic chemotherapy: Secondary | ICD-10-CM | POA: Diagnosis present

## 2018-02-10 DIAGNOSIS — I700 Atherosclerosis of aorta: Secondary | ICD-10-CM

## 2018-02-10 DIAGNOSIS — Z87891 Personal history of nicotine dependence: Secondary | ICD-10-CM | POA: Diagnosis not present

## 2018-02-10 DIAGNOSIS — Z5189 Encounter for other specified aftercare: Secondary | ICD-10-CM | POA: Diagnosis not present

## 2018-02-10 DIAGNOSIS — R5383 Other fatigue: Secondary | ICD-10-CM | POA: Insufficient documentation

## 2018-02-10 DIAGNOSIS — J8410 Pulmonary fibrosis, unspecified: Secondary | ICD-10-CM

## 2018-02-10 DIAGNOSIS — C8585 Other specified types of non-Hodgkin lymphoma, lymph nodes of inguinal region and lower limb: Secondary | ICD-10-CM | POA: Diagnosis present

## 2018-02-10 DIAGNOSIS — R05 Cough: Secondary | ICD-10-CM

## 2018-02-10 DIAGNOSIS — K5730 Diverticulosis of large intestine without perforation or abscess without bleeding: Secondary | ICD-10-CM

## 2018-02-10 DIAGNOSIS — N1330 Unspecified hydronephrosis: Secondary | ICD-10-CM

## 2018-02-10 DIAGNOSIS — C8580 Other specified types of non-Hodgkin lymphoma, unspecified site: Principal | ICD-10-CM

## 2018-02-10 DIAGNOSIS — N200 Calculus of kidney: Secondary | ICD-10-CM

## 2018-02-10 LAB — COMPREHENSIVE METABOLIC PANEL
ALT: 13 U/L (ref 0–44)
ANION GAP: 7 (ref 5–15)
AST: 19 U/L (ref 15–41)
Albumin: 3.7 g/dL (ref 3.5–5.0)
Alkaline Phosphatase: 82 U/L (ref 38–126)
BILIRUBIN TOTAL: 0.3 mg/dL (ref 0.3–1.2)
BUN: 15 mg/dL (ref 8–23)
CO2: 27 mmol/L (ref 22–32)
Calcium: 9.1 mg/dL (ref 8.9–10.3)
Chloride: 107 mmol/L (ref 98–111)
Creatinine, Ser: 0.85 mg/dL (ref 0.61–1.24)
GFR calc Af Amer: >60 mL/min (ref >60)
GLUCOSE: 97 mg/dL (ref 70–99)
POTASSIUM: 4.2 mmol/L (ref 3.5–5.1)
Sodium: 141 mmol/L (ref 135–145)
TOTAL PROTEIN: 7.1 g/dL (ref 6.5–8.1)

## 2018-02-10 LAB — LACTATE DEHYDROGENASE: LDH: 212 U/L — ABNORMAL HIGH (ref 98–192)

## 2018-02-10 LAB — CBC WITH DIFFERENTIAL/PLATELET
ABS IMMATURE GRANULOCYTES: 0.13 10*3/uL — AB (ref 0.00–0.07)
BASOS PCT: 1 %
Basophils Absolute: 0.1 10*3/uL (ref 0.0–0.1)
Eosinophils Absolute: 0.1 10*3/uL (ref 0.0–0.5)
Eosinophils Relative: 1 %
HCT: 42.1 % (ref 39.0–52.0)
Hemoglobin: 13.8 g/dL (ref 13.0–17.0)
IMMATURE GRANULOCYTES: 1 %
Lymphocytes Relative: 16 %
Lymphs Abs: 1.5 10*3/uL (ref 0.7–4.0)
MCH: 30.5 pg (ref 26.0–34.0)
MCHC: 32.8 g/dL (ref 30.0–36.0)
MCV: 92.9 fL (ref 80.0–100.0)
MONO ABS: 1.2 10*3/uL — AB (ref 0.1–1.0)
MONOS PCT: 12 %
NEUTROS ABS: 6.8 10*3/uL (ref 1.7–7.7)
NEUTROS PCT: 69 %
PLATELETS: 370 10*3/uL (ref 150–400)
RBC: 4.53 MIL/uL (ref 4.22–5.81)
RDW: 16.3 % — ABNORMAL HIGH (ref 11.5–15.5)
WBC: 9.8 10*3/uL (ref 4.0–10.5)
nRBC: 0 % (ref 0.0–0.2)

## 2018-02-10 LAB — URIC ACID: Uric Acid, Serum: 2.9 mg/dL — ABNORMAL LOW (ref 3.7–8.6)

## 2018-02-10 NOTE — Telephone Encounter (Signed)
Gave patient avs and calendar.  Appt on 12/2 los should have been 12/23, not 11/23.  Overbooked appt per GM.

## 2018-02-11 ENCOUNTER — Other Ambulatory Visit: Payer: Medicare Other

## 2018-02-11 ENCOUNTER — Inpatient Hospital Stay: Payer: Medicare Other

## 2018-02-11 VITALS — BP 117/68 | HR 75 | Temp 98.1°F | Resp 18

## 2018-02-11 DIAGNOSIS — Z5111 Encounter for antineoplastic chemotherapy: Secondary | ICD-10-CM | POA: Diagnosis not present

## 2018-02-11 DIAGNOSIS — C8580 Other specified types of non-Hodgkin lymphoma, unspecified site: Principal | ICD-10-CM

## 2018-02-11 DIAGNOSIS — C8330 Diffuse large B-cell lymphoma, unspecified site: Secondary | ICD-10-CM

## 2018-02-11 MED ORDER — PALONOSETRON HCL INJECTION 0.25 MG/5ML
0.2500 mg | Freq: Once | INTRAVENOUS | Status: AC
  Administered 2018-02-11: 0.25 mg via INTRAVENOUS

## 2018-02-11 MED ORDER — SODIUM CHLORIDE 0.9 % IV SOLN
750.0000 mg/m2 | Freq: Once | INTRAVENOUS | Status: AC
  Administered 2018-02-11: 1560 mg via INTRAVENOUS
  Filled 2018-02-11: qty 78

## 2018-02-11 MED ORDER — DIPHENHYDRAMINE HCL 25 MG PO CAPS
25.0000 mg | ORAL_CAPSULE | Freq: Once | ORAL | Status: AC
  Administered 2018-02-11: 25 mg via ORAL

## 2018-02-11 MED ORDER — VINCRISTINE SULFATE CHEMO INJECTION 1 MG/ML
1.0000 mg | Freq: Once | INTRAVENOUS | Status: AC
  Administered 2018-02-11: 1 mg via INTRAVENOUS
  Filled 2018-02-11: qty 1

## 2018-02-11 MED ORDER — DIPHENHYDRAMINE HCL 25 MG PO CAPS
ORAL_CAPSULE | ORAL | Status: AC
  Filled 2018-02-11: qty 1

## 2018-02-11 MED ORDER — PALONOSETRON HCL INJECTION 0.25 MG/5ML
INTRAVENOUS | Status: AC
  Filled 2018-02-11: qty 5

## 2018-02-11 MED ORDER — ACETAMINOPHEN 325 MG PO TABS
650.0000 mg | ORAL_TABLET | Freq: Once | ORAL | Status: AC
  Administered 2018-02-11: 650 mg via ORAL

## 2018-02-11 MED ORDER — DOXORUBICIN HCL CHEMO IV INJECTION 2 MG/ML
50.0000 mg/m2 | Freq: Once | INTRAVENOUS | Status: AC
  Administered 2018-02-11: 104 mg via INTRAVENOUS
  Filled 2018-02-11: qty 52

## 2018-02-11 MED ORDER — SODIUM CHLORIDE 0.9 % IV SOLN
Freq: Once | INTRAVENOUS | Status: AC
  Administered 2018-02-11: 13:00:00 via INTRAVENOUS
  Filled 2018-02-11: qty 250

## 2018-02-11 MED ORDER — SODIUM CHLORIDE 0.9 % IV SOLN
Freq: Once | INTRAVENOUS | Status: AC
  Administered 2018-02-11: 14:00:00 via INTRAVENOUS
  Filled 2018-02-11: qty 5

## 2018-02-11 MED ORDER — ACETAMINOPHEN 325 MG PO TABS
ORAL_TABLET | ORAL | Status: AC
  Filled 2018-02-11: qty 2

## 2018-02-11 MED ORDER — SODIUM CHLORIDE 0.9 % IV SOLN
375.0000 mg/m2 | Freq: Once | INTRAVENOUS | Status: AC
  Administered 2018-02-11: 800 mg via INTRAVENOUS
  Filled 2018-02-11: qty 30

## 2018-02-11 MED ORDER — SODIUM CHLORIDE 0.9 % IV SOLN: 375.0000 mg/m2 | Freq: Once | INTRAVENOUS | Status: DC

## 2018-02-11 NOTE — Patient Instructions (Signed)
Cancer Center Discharge Instructions for Patients Receiving Chemotherapy  Today you received the following chemotherapy agents: adriamycin, vincristine, cytoxan, rituxan  To help prevent nausea and vomiting after your treatment, we encourage you to take your nausea medication as directed.   If you develop nausea and vomiting that is not controlled by your nausea medication, call the clinic.   BELOW ARE SYMPTOMS THAT SHOULD BE REPORTED IMMEDIATELY:  *FEVER GREATER THAN 100.5 F  *CHILLS WITH OR WITHOUT FEVER  NAUSEA AND VOMITING THAT IS NOT CONTROLLED WITH YOUR NAUSEA MEDICATION  *UNUSUAL SHORTNESS OF BREATH  *UNUSUAL BRUISING OR BLEEDING  TENDERNESS IN MOUTH AND THROAT WITH OR WITHOUT PRESENCE OF ULCERS  *URINARY PROBLEMS  *BOWEL PROBLEMS  UNUSUAL RASH Items with * indicate a potential emergency and should be followed up as soon as possible.  Feel free to call the clinic should you have any questions or concerns. The clinic phone number is [phone].  Please show the CHEMO ALERT CARD at check-in to the Emergency Department and triage nurse.   

## 2018-02-11 NOTE — Progress Notes (Signed)
Peripheral IV left arm 20 g with no blood return prior to adriamycin push. New IV placed right arm 20G with blood return initially and throughout adriamycin push.

## 2018-02-13 ENCOUNTER — Inpatient Hospital Stay: Payer: Medicare Other

## 2018-02-13 DIAGNOSIS — C8330 Diffuse large B-cell lymphoma, unspecified site: Secondary | ICD-10-CM

## 2018-02-13 DIAGNOSIS — Z5111 Encounter for antineoplastic chemotherapy: Secondary | ICD-10-CM | POA: Diagnosis not present

## 2018-02-13 DIAGNOSIS — C8580 Other specified types of non-Hodgkin lymphoma, unspecified site: Principal | ICD-10-CM

## 2018-02-13 MED ORDER — PEGFILGRASTIM-CBQV 6 MG/0.6ML ~~LOC~~ SOSY
6.0000 mg | PREFILLED_SYRINGE | Freq: Once | SUBCUTANEOUS | Status: AC
  Administered 2018-02-13: 6 mg via SUBCUTANEOUS

## 2018-02-13 MED ORDER — PEGFILGRASTIM-CBQV 6 MG/0.6ML ~~LOC~~ SOSY
PREFILLED_SYRINGE | SUBCUTANEOUS | Status: AC
  Filled 2018-02-13: qty 0.6

## 2018-02-14 NOTE — Progress Notes (Signed)
Lymphoma Location(s) / Histology: solid mass in the left testis upper and lower pole   Michael Allen presented with symptoms of: The patient was referred to Dr. [name] for renal stones.  Incidentally he was noted to have left scrotal swelling.  This had been present several months and was initially felt to be a hydrocele.  Scrotal ultrasound was performed 12/06/2017.  This found no spermatocele but a solid mass in the left testis upper and lower pole.  Accordingly on 12/20/2017 the patient underwent left inguinal orchiectomy.  The pathology from this procedure ([id number]) showed a diffuse large B cell non-Hodgkin's lymphoma, CD20 positive, CD10 weakly positive, no coexpression of CD5, and most consistent with a germinal center B-cell subtype.  Biopsies revealed: 12/20/17:  Diagnosis Testis, tumor, left - DIFFUSE LARGE B-CELL LYMPHOMA.  Past/Anticipated interventions by medical oncology, if any: Per Dr. Jana Hakim 02/10/18:  The patient continues with CHOP/Rituxan chemotherapy. Today is day 25 cycle 1 of 3 planned cycles. He is tolerating well. After the first cycle all of his hair fell out. If he runs his hands through his hair tuff would come out.   Weight changes, if any, over the past 6 months: There has been no unexplained fatigue or weight loss, no rash, no drenching sweats and no fevers.  Recurrent fevers, or drenching night sweats, if any: Denies B-symptoms: drenching sweat, explained fatigue or weight loss, adenopathy, or fever.  Has a bit of a "tickle" in the back of his throat. Doesn't associate it to the drugs and may well be allergy.  SAFETY ISSUES:  Prior radiation? No  Pacemaker/ICD? No  Possible current pregnancy? N/A, pt is male  Is the patient on methotrexate? No  Current Complaints / other details:  Pt presents today for initial consult with Dr. Sondra Come for Radiation Oncology. Pt is accompanied by wife. Pt scored 0/10 on initial distress screening. SW consult offered  to pt; pt declined.    ADVANCED DIRECTIVES: Please note patient would refuse a blood transfusion even for lifesaving reasons (discussed 12/27/2017).  BP 122/82 (BP Location: Right Arm, Patient Position: Sitting)   Pulse 82   Temp 97.7 F (36.5 C)   Resp 20   Ht 6\' 2"  (1.88 m)   Wt 185 lb 9.6 oz (84.2 kg)   SpO2 100%   BMI 23.83 kg/m   Wt Readings from Last 3 Encounters:  02/17/18 185 lb 9.6 oz (84.2 kg)  02/10/18 183 lb 14.4 oz (83.4 kg)  01/24/18 182 lb 8 oz (82.8 kg)     Loma Sousa, RN BSN

## 2018-02-17 ENCOUNTER — Inpatient Hospital Stay: Payer: Medicare Other

## 2018-02-17 ENCOUNTER — Other Ambulatory Visit: Payer: Self-pay

## 2018-02-17 ENCOUNTER — Ambulatory Visit
Admission: RE | Admit: 2018-02-17 | Discharge: 2018-02-17 | Disposition: A | Discharge status: Home / Self Care | Payer: Medicare Other | Source: Ambulatory Visit | Attending: Radiation Oncology | Admitting: Radiation Oncology

## 2018-02-17 ENCOUNTER — Encounter: Payer: Self-pay | Admitting: Radiation Oncology

## 2018-02-17 VITALS — BP 122/82 | HR 82 | Temp 97.7°F | Resp 20 | Ht 74.0 in | Wt 185.6 lb

## 2018-02-17 DIAGNOSIS — C8330 Diffuse large B-cell lymphoma, unspecified site: Secondary | ICD-10-CM

## 2018-02-17 DIAGNOSIS — Z79899 Other long term (current) drug therapy: Secondary | ICD-10-CM | POA: Diagnosis not present

## 2018-02-17 DIAGNOSIS — C8580 Other specified types of non-Hodgkin lymphoma, unspecified site: Secondary | ICD-10-CM

## 2018-02-17 DIAGNOSIS — I700 Atherosclerosis of aorta: Secondary | ICD-10-CM

## 2018-02-17 DIAGNOSIS — Z87891 Personal history of nicotine dependence: Secondary | ICD-10-CM | POA: Insufficient documentation

## 2018-02-17 DIAGNOSIS — Z5111 Encounter for antineoplastic chemotherapy: Secondary | ICD-10-CM | POA: Diagnosis not present

## 2018-02-17 DIAGNOSIS — N1330 Unspecified hydronephrosis: Secondary | ICD-10-CM

## 2018-02-17 DIAGNOSIS — N200 Calculus of kidney: Secondary | ICD-10-CM

## 2018-02-17 DIAGNOSIS — K5730 Diverticulosis of large intestine without perforation or abscess without bleeding: Secondary | ICD-10-CM

## 2018-02-17 DIAGNOSIS — C8599 Non-Hodgkin lymphoma, unspecified, extranodal and solid organ sites: Secondary | ICD-10-CM

## 2018-02-17 DIAGNOSIS — J8410 Pulmonary fibrosis, unspecified: Secondary | ICD-10-CM

## 2018-02-17 LAB — CBC WITH DIFFERENTIAL/PLATELET
ABS IMMATURE GRANULOCYTES: 1.97 10*3/uL — AB (ref 0.00–0.07)
BASOS PCT: 1 %
Basophils Absolute: 0.1 10*3/uL (ref 0.0–0.1)
Eosinophils Absolute: 0.1 10*3/uL (ref 0.0–0.5)
Eosinophils Relative: 0 %
HCT: 40.6 % (ref 39.0–52.0)
Hemoglobin: 13.3 g/dL (ref 13.0–17.0)
Immature Granulocytes: 12 %
Lymphocytes Relative: 7 %
Lymphs Abs: 1.2 10*3/uL (ref 0.7–4.0)
MCH: 30.6 pg (ref 26.0–34.0)
MCHC: 32.8 g/dL (ref 30.0–36.0)
MCV: 93.3 fL (ref 80.0–100.0)
Monocytes Absolute: 0.2 10*3/uL (ref 0.1–1.0)
Monocytes Relative: 1 %
Neutro Abs: 13.1 10*3/uL — ABNORMAL HIGH (ref 1.7–7.7)
Neutrophils Relative %: 79 %
PLATELETS: 156 10*3/uL (ref 150–400)
RBC: 4.35 MIL/uL (ref 4.22–5.81)
RDW: 16 % — ABNORMAL HIGH (ref 11.5–15.5)
WBC: 16.6 10*3/uL — ABNORMAL HIGH (ref 4.0–10.5)
nRBC: 0 % (ref 0.0–0.2)

## 2018-02-17 LAB — LACTATE DEHYDROGENASE: LDH: 259 U/L — ABNORMAL HIGH (ref 98–192)

## 2018-02-17 LAB — COMPREHENSIVE METABOLIC PANEL
ALT: 13 U/L (ref 0–44)
AST: 17 U/L (ref 15–41)
Albumin: 3.7 g/dL (ref 3.5–5.0)
Alkaline Phosphatase: 110 U/L (ref 38–126)
Anion gap: 9 (ref 5–15)
BUN: 14 mg/dL (ref 8–23)
CO2: 28 mmol/L (ref 22–32)
Calcium: 9 mg/dL (ref 8.9–10.3)
Chloride: 104 mmol/L (ref 98–111)
Creatinine, Ser: 0.71 mg/dL (ref 0.61–1.24)
GFR calc Af Amer: >60 mL/min (ref >60)
GFR calc non Af Amer: >60 mL/min (ref >60)
Glucose, Bld: 95 mg/dL (ref 70–99)
POTASSIUM: 4.3 mmol/L (ref 3.5–5.1)
SODIUM: 141 mmol/L (ref 135–145)
Total Bilirubin: 0.9 mg/dL (ref 0.3–1.2)
Total Protein: 6.8 g/dL (ref 6.5–8.1)

## 2018-02-17 LAB — URIC ACID: Uric Acid, Serum: 2.9 mg/dL — ABNORMAL LOW (ref 3.7–8.6)

## 2018-02-17 NOTE — Progress Notes (Signed)
Radiation Oncology         [phone] ________________________________  Initial Outpatient Consultation  Name: Michael Allen MRN: [id number]  Date: 02/17/2018  DOB: 1945/10/18  WU:[id number], Jori Moll, MD  Magrinat, [name], MD   REFERRING PHYSICIAN: Magrinat, [name], MD  DIAGNOSIS: Diffuse large cell non-Hodgkin's lymphoma, germinal center B-cell subtype, stage I,  presenting in the left testicle  HISTORY OF PRESENT ILLNESS::Michael Allen is a 72 y.o. male who is presenting to the office today for evaluation of diffuse large cell non-Hodgkin's lymphoma. He is accompanied by his wife Michael Allen. He is seeing Dr. Jana Hakim for chemotherapy. He has received 2 cycles so far. He originally presented with lower abdominal pain, believed to be kidney stones. One testicle was noted to be larger than the other, prompting a CT scan. He has been tolerating chemotherapy well so far.   He underwent a CT scan on 10/25/17 that showed a 4 mm stone in the distal right ureter which caused mild to moderate right hydronephrosis and right hydroureter. There was no other acute abnormality noted within the abdomen or pelvis. There was a small nonobstructive stone in the right kidney.   Pathology on 12/20/17 from his surgery showed diffuse large B-cell lymphoma in the left testicular tumor.  His bone marrow biopsy on 01/01/18 showed slightly hypercellular bone marrow for his age with trilineage hematopoiesis. There was no significant morphologic abnormalities In the peripheral blood.  His Bone Marrow Flow Cytometry results showed no monoclonal B cell population or abnormal T cell phenotype identified.   He received a PET scan on 12/31/17 that revealed no residual hypermetabolic lymphoma. Hypermetabolism within the right testicle was favored to be physiologic. No right-sided mass noted on prior ultrasound. Right maxillary sinus hypermetabolism was likely related to chronic sinusitis. Small bowel mesenteric  findings which suggest mesenteric adenitis/panniculitis but this was of indeterminate clinical significance. Coronary artery atherosclerosis was noted, as well as right nephrolithiasis.   He has no associated symptoms to report, aside from mild scrotal swelling. He denies any other symptoms. No reports of "B" symptoms  He is scheduled for a spinal tap on 12/19. His last round of chemotherapy is 12/23.  PREVIOUS RADIATION THERAPY: No  PAST MEDICAL HISTORY:  has a past medical history of Chalazion, Dental crowns present, GERD (gastroesophageal reflux disease), History of kidney stones (10/25/2017), Hydronephrosis (10/25/2017), Hydroureter, right (10/25/2017), Impaired fasting glucose, Migraine, Plantar fasciitis (2003), PONV (postoperative nausea and vomiting), Pure hyperglyceridemia, Rectal bleeding (2002), Recurrent spontaneous pneumothorax, Skin cancer of scalp (2011), Transfusion of blood product refused for religious reason, Ureteral stone (10/25/2017), and Wears glasses.    PAST SURGICAL HISTORY: Past Surgical History:  Procedure Laterality Date  . CHALAZION EXCISION Right    cyst removal , right eye  . COLONOSCOPY    . [place]  2002  . INGUINAL HERNIA REPAIR Bilateral 2005   wire mesh  . LUNG SURGERY     x2  . ORCHIECTOMY Left 12/20/2017   Procedure: ORCHIECTOMY;  Surgeon: [name], MD;  Location: [facility];  Service: Urology;  Laterality: Left;  . TONSILLECTOMY      FAMILY HISTORY: family history includes Breast cancer in his mother; CAD in his father; Dementia in his mother; Heart Problems in his father; Heart disease in his father.  SOCIAL HISTORY:  reports that he has quit smoking. He has never used smokeless tobacco. He reports that he drinks alcohol. He reports that he does not use drugs.  ALLERGIES: Blood-group specific  substance; Oxycodone; Penicillins; and Shellfish allergy  MEDICATIONS:  Current Outpatient Medications  Medication  Sig Dispense Refill  . allopurinol (ZYLOPRIM) 300 MG tablet Take 1 tablet (300 mg total) by mouth daily. 30 tablet 5  . ciprofloxacin (CIPRO) 500 MG tablet Take 1 tablet (500 mg total) by mouth 2 (two) times daily. 30 tablet 0  . ondansetron (ZOFRAN) 8 MG tablet Take 1 tablet (8 mg total) by mouth 2 (two) times daily as needed for refractory nausea / vomiting. Start on day 3 after cyclophosphamide chemotherapy. 30 tablet 1  . predniSONE (DELTASONE) 20 MG tablet Take 3 tablets (60 mg total) by mouth daily. Take on days 1-5 of chemotherapy. 50 tablet 6  . prochlorperazine (COMPAZINE) 10 MG tablet Take 1 tablet (10 mg total) by mouth every 6 (six) hours as needed (Nausea or vomiting). 30 tablet 6  . valACYclovir (VALTREX) 500 MG tablet Take 1 tablet (500 mg total) by mouth 2 (two) times daily. 90 tablet 3   No current facility-administered medications for this encounter.     REVIEW OF SYSTEMS:  A 10+ POINT REVIEW OF SYSTEMS WAS OBTAINED including neurology, dermatology, psychiatry, cardiac, respiratory, lymph, extremities, GI, GU, musculoskeletal, constitutional, reproductive, HEENT. All pertinent positives are noted in the HPI. All others are negative.    PHYSICAL EXAM:  height is '6\' 2"'  (1.88 m) and weight is 185 lb 9.6 oz (84.2 kg). His temperature is 97.7 F (36.5 C). His blood pressure is 122/82 and his pulse is 82. His respiration is 20 and oxygen saturation is 100%.   General: Alert and oriented, in no acute distress HEENT: Head is normocephalic. Extraocular movements are intact. Oropharynx is clear. Neck: Neck is supple, no palpable cervical or supraclavicular lymphadenopathy. Heart: Regular in rate and rhythm with no murmurs, rubs, or gallops. Chest: Clear to auscultation bilaterally, with no rhonchi, wheezes, or rales. patient has large scars along the posterior lateral chest wall area from his prior thoracotomies for spontaneous pneumothoraces Abdomen: Soft, nontender, nondistended, with  no rigidity or guarding. Extremities: No cyanosis or edema. Lymphatics: see Neck Exam Skin: No concerning lesions. Musculoskeletal: symmetric strength and muscle tone throughout. Neurologic: Cranial nerves II through XII are grossly intact. No obvious focalities. Speech is fluent. Coordination is intact. Psychiatric: Judgment and insight are intact. Affect is appropriate. no inguinal adenopathy. The patient has a scar in the left inguinal area from his recent orchiectomy. The left testicle is surgically absent. The right testicle is soft without mass, no enlargement   ECOG = 1  0 - Asymptomatic (Fully active, able to carry on all predisease activities without restriction)  1 - Symptomatic but completely ambulatory (Restricted in physically strenuous activity but ambulatory and able to carry out work of a light or sedentary nature. For example, light housework, office work)  2 - Symptomatic, <50% in bed during the day (Ambulatory and capable of all self care but unable to carry out any work activities. Up and about more than 50% of waking hours)  3 - Symptomatic, >50% in bed, but not bedbound (Capable of only limited self-care, confined to bed or chair 50% or more of waking hours)  4 - Bedbound (Completely disabled. Cannot carry on any self-care. Totally confined to bed or chair)  5 - Death   Eustace Pen MM, Creech RH, Tormey DC, et al. [phone]). "Toxicity and response criteria of the [facility]". [place] Oncol. 5 (6): 649-55  LABORATORY DATA:  Lab Results  Component Value Date  WBC 16.6 (H) 02/17/2018   HGB 13.3 02/17/2018   HCT 40.6 02/17/2018   MCV 93.3 02/17/2018   PLT 156 02/17/2018   NEUTROABS 13.1 (H) 02/17/2018   Lab Results  Component Value Date   NA 141 02/17/2018   K 4.3 02/17/2018   CL 104 02/17/2018   CO2 28 02/17/2018   GLUCOSE 95 02/17/2018   CREATININE 0.71 02/17/2018   CALCIUM 9.0 02/17/2018      RADIOGRAPHY: No results found.      IMPRESSION:  Diffuse large cell non-Hodgkin's lymphoma, germinal center B-cell subtype, stage I,  presenting in the left testicle    The patient would be a good candidate for radiation therapy directed at the scrotum and contralateral testis. These areas have been shown to be likely sites for testicular lymphoma to reoccur.  Today, I talked to the patient and his wife about the findings and work-up thus far.  We discussed the natural history of testicular lymphoma and general treatment, highlighting the role of radiotherapy in the management.  We discussed the available radiation techniques, and focused on the details of logistics and delivery.  We reviewed the anticipated acute and late sequelae associated with radiation in this setting.  The patient was encouraged to ask questions that I answered to the best of my ability.  A patient consent form was discussed and signed.  We retained a copy for our records.  The patient would like to proceed with radiation and will be scheduled for CT simulation.   PLAN: Pt will continue with adjuvant chemotherapy, which he will complete in early January and then be seen back in radiation oncology for the planning of his treatment. I anticipate approximately 3 weeks of radiation therapy directed at  the scrotum and contralateral testis.      ------------------------------------------------  Blair Promise, PhD, MD  This document serves as a record of services personally performed by [name], MD. It was created on his behalf by Mary-Margaret Loma Messing, a trained medical scribe. The creation of this record is based on the scribe's personal observations and the provider's statements to them. This document has been checked and approved by the attending provider.

## 2018-02-19 ENCOUNTER — Telehealth: Payer: Self-pay

## 2018-02-19 NOTE — Telephone Encounter (Signed)
Returned patient's wife call labs and if there is a need for antibiotic.    Patient's wife stated that labs were drawn to make sure he did not need antibiotics, they were prescribed after 1st treatment and was unsure if they are needed this time.  Nurse reviewed, antibiotics prescribed after 1st cycle d/t being extremely neutropenic.  Per patient's wife patient has no fever or chills present.  Labs WNL, increased WBC and ANC, however Udencya given on 02/13/2018.  Nurse educated wife on this information.  Voiced understanding, aware to call if fever or any signs of infection develop.  No further needs at this time.

## 2018-02-27 ENCOUNTER — Encounter (HOSPITAL_COMMUNITY): Payer: Self-pay

## 2018-02-27 ENCOUNTER — Inpatient Hospital Stay: Payer: Medicare Other

## 2018-02-27 ENCOUNTER — Ambulatory Visit: Payer: Medicare Other | Admitting: Oncology

## 2018-02-27 ENCOUNTER — Ambulatory Visit (HOSPITAL_COMMUNITY)
Admission: RE | Admit: 2018-02-27 | Discharge: 2018-02-27 | Disposition: A | Discharge status: Home / Self Care | Payer: Medicare Other | Source: Ambulatory Visit | Attending: Oncology | Admitting: Oncology

## 2018-02-27 ENCOUNTER — Inpatient Hospital Stay (HOSPITAL_BASED_OUTPATIENT_CLINIC_OR_DEPARTMENT_OTHER): Payer: Medicare Other | Admitting: Medical

## 2018-02-27 ENCOUNTER — Other Ambulatory Visit: Payer: Self-pay | Admitting: Oncology

## 2018-02-27 VITALS — BP 102/59 | HR 65 | Temp 98.1°F | Resp 18

## 2018-02-27 DIAGNOSIS — C8580 Other specified types of non-Hodgkin lymphoma, unspecified site: Secondary | ICD-10-CM

## 2018-02-27 DIAGNOSIS — C8330 Diffuse large B-cell lymphoma, unspecified site: Secondary | ICD-10-CM

## 2018-02-27 DIAGNOSIS — Z5111 Encounter for antineoplastic chemotherapy: Secondary | ICD-10-CM | POA: Diagnosis not present

## 2018-02-27 DIAGNOSIS — L270 Generalized skin eruption due to drugs and medicaments taken internally: Secondary | ICD-10-CM

## 2018-02-27 LAB — GLUCOSE, CSF: GLUCOSE CSF: 60 mg/dL (ref 40–70)

## 2018-02-27 LAB — CSF CELL COUNT WITH DIFFERENTIAL
RBC Count, CSF: 1 /mm3 — ABNORMAL HIGH
Tube #: 4
WBC, CSF: 2 /mm3 (ref 0–5)

## 2018-02-27 LAB — PROTEIN, CSF: Total  Protein, CSF: 64 mg/dL — ABNORMAL HIGH (ref 15–45)

## 2018-02-27 LAB — GRAM STAIN: GRAM STAIN: NONE SEEN

## 2018-02-27 MED ORDER — LIDOCAINE HCL 1 % IJ SOLN
INTRAMUSCULAR | Status: AC
  Filled 2018-02-27: qty 20

## 2018-02-27 MED ORDER — SODIUM CHLORIDE 0.9 % IV SOLN
INTRAVENOUS | Status: AC
  Administered 2018-02-27: 14:00:00 via INTRAVENOUS
  Filled 2018-02-27 (×2): qty 250

## 2018-02-27 MED ORDER — METRONIDAZOLE 1 % EX GEL: Freq: Every day | CUTANEOUS | 0 refills | Status: DC

## 2018-02-27 NOTE — Patient Instructions (Signed)
Lumbar Puncture, Care After  This sheet gives you information about how to care for yourself after your procedure. Your health care provider may also give you more specific instructions. If you have problems or questions, contact your health care provider.  What can I expect after the procedure?  After the procedure, it is common to have:   Mild discomfort or pain at the puncture site.   A mild headache that is relieved with pain medicines.  Follow these instructions at home:  Activity     Lie down flat or rest for as long as directed by your health care provider.   Return to your normal activities as told by your health care provider. Ask your health care provider what activities are safe for you.   Avoid lifting anything heavier than 10 lb (4.5 kg) for at least 12 hours after the procedure.   Do not drive for 24 hours if you were given a medicine to help you relax (sedative) during your procedure.   Do not drive or use heavy machinery while taking prescription pain medicine.  Puncture site care   Remove or change your bandage (dressing) as told by your health care provider.   Check your puncture area every day for signs of infection. Check for:  ? More pain.  ? Redness or swelling.  ? Fluid or blood leaking from the puncture site.  ? Warmth.  ? Pus or a bad smell.  General instructions   Take over-the-counter and prescription medicines only as told by your health care provider.   Drink enough fluids to keep your urine clear or pale yellow. Your health care provider may recommend drinking caffeine to prevent a headache.   Keep all follow-up visits as told by your health care provider. This is important.  Contact a health care provider if:   You have fever or chills.   You have nausea or vomiting.   You have a headache that lasts for more than 2 days or does not get better with medicine.  Get help right away if:   You develop any of the following in your  legs:  ? Weakness.  ? Numbness.  ? Tingling.   You are unable to control when you urinate or have a bowel movement (incontinence).   You have signs of infection around your puncture site, such as:  ? More pain.  ? Redness or swelling.  ? Fluid or blood leakage.  ? Warmth.  ? Pus or a bad smell.   You are dizzy or you feel like you might faint.   You have a severe headache, especially when you sit or stand.  Summary   A lumbar puncture is a procedure in which a small needle is inserted into the lower back to remove fluid that surrounds the brain and spinal cord.   After this procedure, it is common to have a headache and pain around the needle insertion area.   Lying flat, staying hydrated, and drinking caffeine can help prevent headaches.   Monitor your needle insertion site for signs of infection, including warmth, fluid, or more pain.   Get help right away if you develop leg weakness, leg numbness, incontinence, or severe headaches.  This information is not intended to replace advice given to you by your health care provider. Make sure you discuss any questions you have with your health care provider.  Document Released: 03/03/2013 Document Revised: 04/11/2016 Document Reviewed: 04/11/2016  Elsevier Interactive Patient Education  2019 [employer]

## 2018-02-27 NOTE — Progress Notes (Signed)
See my separate note.

## 2018-02-27 NOTE — Procedures (Signed)
CLINICAL DATA: Large cell malignant lymphoma with testicular involvement  EXAM:  DIAGNOSTIC LUMBAR PUNCTURE UNDER FLUOROSCOPIC GUIDANCE  FLUOROSCOPY TIME: Fluoroscopy Time:  0 minutes, 18 seconds  Radiation Exposure Index (if provided by the fluoroscopic device):  1.2 mGy  Number of Acquired Spot Images: 0  PROCEDURE:  I discussed the risks (including hemorrhage, infection, headache, and nerve damage, among others), benefits, and alternatives to fluoroscopically guided lumbar puncture with the patient.  We specifically discussed the high technical likelihood of success of the procedure. The patient understood and elected to undergo the procedure.    Standard time-out was employed.  Following sterile skin prep and local anesthetic administration consisting of 1 percent lidocaine, a 20 gauge spinal needle was advanced without difficulty into the thecal sac at the at the L5-S1 level.  Clear CSF was returned.  Opening pressure was 7 cm of water.   12 cc of clear CSF was collected.  The needle was subsequently removed and the skin cleansed and bandaged.  No immediate complications were observed.     IMPRESSION: Technically successful fluoroscopically guided lumbar puncture yielding 12 cc of clear CSF for diagnostic purposes.

## 2018-02-27 NOTE — Progress Notes (Unsigned)
They called to tell me Montrelle's LP had shown positive Gram stain.  I went to see him and he is having no headache, no fever, and no stiff neck or other symptoms of concern.  In fact he had his LP today without any event.  He is comfortable in bed.  Review of the results show actually no organisms on Gram stain.  He does have rosacea.  I think he would benefit from [facility] and I have gone ahead and entered that order for him.

## 2018-02-27 NOTE — Progress Notes (Signed)
From short stay via stretcher to bed. Laying flat until 3:45 pm today post lumbar puncture. Dressing to lower back clean, dry and intact.  Red rash to face and neck, denies itching. Sandi Mealy, PA to see in the infusion room.  Discharge instructions given. Dressing to lower back clean, dry and intact.

## 2018-03-01 ENCOUNTER — Ambulatory Visit: Payer: Medicare Other

## 2018-03-02 NOTE — Progress Notes (Signed)
[place]  [phone]) [phone] [phone]) [phone]     ID: Michael Allen DOB: 08/10/1945  MR#: [id number]  LFY#:[id number]  Patient Care Team: Seward Carol, MD as PCP - General (Internal Medicine) [name], MD as Consulting Physician (Urology) Magrinat, [name], MD as Consulting Physician (Oncology) [name], MD as Consulting Physician (Ophthalmology) [name], MD as Consulting Physician (Radiation Oncology) [name], MD OTHER MD:  CHIEF COMPLAINT: Diffuse large cell non-Hodgkin's lymphoma, germinal center B-cell subtype  CURRENT TREATMENT: Completing chemotherapy, radiation pending   HISTORY OF CURRENT ILLNESS: From the original intake note 12/27/2017:  "The patient was referred to Dr. [name] for renal stones.  Incidentally he was noted to have left scrotal swelling.  This had been present several months and was initially felt to be a hydrocele.  Scrotal ultrasound was performed 12/06/2017.  This found no spermatocele but a solid mass in the left testis upper and lower pole.  Accordingly on 12/20/2017 the patient underwent left inguinal orchiectomy.  The pathology from this procedure ([id number]) showed a diffuse large B cell non-Hodgkin's lymphoma, CD20 positive, CD10 weakly positive, no coexpression of CD5, and most consistent with a germinal center B-cell subtype.  I was called by Dr. [name] earlier today and the patient was brought in for further evaluation."  His subsequent history is as detailed below.  INTERVAL HISTORY: Michael Allen returns today for follow-up of his diffuse large cell B-cell non-Hodgkin's lymphoma. He is accompanied by his wife.  The patient continues with his CHOP-R chemotherapy, which he has tolerated well. Today is day 1 cycle 3 of 3 planned chemotherapy cycles.    Since his last visit here, he underwent a diagnostic lumbar puncture under fluoroscopic guidance on 02/27/2018.  Protein was 64,  glucose 60.  Gram stain was negative.  Pathology from this procedure ([id number]) showed a small number of mature lymphocytes, no evidence of malignancy.   Since his last visit here Michael Allen also met with radiation oncology.  Dr. Sondra Come is planning approximately 3 weeks of radiation therapy to the scrotum and contralateral testis   REVIEW OF SYSTEMS:  Michael Allen is doing well overall. 02/27/2018, he felt some headache. On 02/28/2018, he stayed in a horizontal position all day. On 03/01/2018 he had meetings from 12:00 pm to 8:00 pm; he was sitting vertically all day and felt fatigued. It was also bright that day, which gave him headaches. On 03/02/2018 he resumed laying in a horizontal position all day and took one tylenol three times. Today (03/03/2018), his body feels fine; his head feels tired, and he feels some pressure on the top of his head. He is drinking about 50 ounces of water per day. He is drinking a lot of coffee. His wife says that he is making light of how he appears at home. The patient denies visual changes, nausea, vomiting, or dizziness. There has been no unusual cough, phlegm production, or pleurisy. This been no change in bowel or bladder habits. The patient denies unexplained weight loss, bleeding, rash, or fever. A detailed review of systems was otherwise noncontributory.     PAST MEDICAL HISTORY: Past Medical History:  Diagnosis Date  . Chalazion   . Dental crowns present   . GERD (gastroesophageal reflux disease)   . History of kidney stones 10/25/2017   right nonobstructing stone noted on CT ABD/Pelvis  . Hydronephrosis 10/25/2017   Mild to moderate right due to 69m stone right distal, noted on CT abd/pelvis  . Hydroureter, right 10/25/2017  4m stone right distal, noted on CT abd/pelvis  . Impaired fasting glucose   . Migraine    optical  . Plantar fasciitis 2003  . PONV (postoperative nausea and vomiting)    light  . Pure hyperglyceridemia   . Rectal bleeding 2002  .  Recurrent spontaneous pneumothorax    due to blebsx2  . Skin cancer of scalp 2011  . Transfusion of blood product refused for religious reason   . Ureteral stone 10/25/2017   445mstone right distal, noted on CT abd/pelvis  . Wears glasses     PAST SURGICAL HISTORY: Past Surgical History:  Procedure Laterality Date  . CHALAZION EXCISION Right    cyst removal , right eye  . [place]ONOSCOPY    . [place]  . INGUINAL HERNIA REPAIR Bilateral 2005   wire mesh  . LUNG SURGERY     x2  . ORCHIECTOMY Left 12/20/2017   Procedure: ORCHIECTOMY;  Surgeon: DaFranchot GalloMD;  [facility] Service[facility] Urology;  Laterality: Left;  . TONSILLECTOMY      FAMILY HISTORY Family History  Problem Relation Age of Onset  . Dementia Mother        progressive  . Breast cancer Mother   . Heart Problems Father        pacemaker  . CAD Father   . Heart disease Father   The patient's father died from heart and lung problems in his 8015s The patient's mother had breast cancer in her 5049sdied in her 9066srom Alzheimer's disease.  The patient had no brothers, 2 sisters.  One sister has what sounds like lung cancer metastatic to the brain, in her 7020s One maternal cousin was diagnosed with Ms. to have been breast cancer metastatic to the lung in her 5022s  SOCIAL HISTORY:  The patient retired from [employer] than 20 years ago.  He is very active in the [employer] ministry.   His wife NaBonnita Nasutis an education major but mostly worked as a housewife.  Daughter [name] a "mom", and sign language interpreted as well as very active in the natural path movement.  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 Smokeless tobacco: Never Used  . Tobacco comment: age [age]Substance Use Topics  . Alcohol use: Yes    Comment: occ beer  . Drug use: Never     Colonoscopy: 2015  PSA:  Bone density:   Allergies  Allergen Reactions  . Blood-Group Specific Substance   . Oxycodone   . Penicillins   . Shellfish Allergy Hives    Shell fish and shrimp    Current Outpatient Medications  Medication Sig Dispense Refill  . allopurinol (ZYLOPRIM) 300 MG tablet Take 1 tablet (300 mg total) by mouth daily. 30 tablet 5  . ciprofloxacin (CIPRO) 500 MG tablet Take 1 tablet (500 mg total) by mouth 2 (two) times daily. 30 tablet 0  . metroNIDAZOLE (METROGEL) 1 % gel Apply topically daily. 45 g 0  . ondansetron (ZOFRAN) 8 MG tablet Take 1 tablet (8 mg total) by mouth 2 (two) times daily as needed for refractory  nausea / vomiting. Start on day 3 after cyclophosphamide chemotherapy. 30 tablet 1  . predniSONE (DELTASONE) 20 MG tablet Take 3 tablets (60 mg total) by mouth daily. Take on days 1-5 of chemotherapy. 50 tablet 6  . prochlorperazine (COMPAZINE) 10 MG tablet Take 1 tablet (10 mg total) by mouth every 6 (six) hours as needed (Nausea or vomiting). 30 tablet 6  . valACYclovir (VALTREX) 500 MG tablet Take 1 tablet (500 mg total) by mouth 2 (two) times daily. 90 tablet 3   No current facility-administered medications for this visit.     OBJECTIVE: Middle-aged white man in no acute distress  Vitals:   03/03/18 1103  BP: 129/72  Pulse: 84  Resp: 17  Temp: 97.7 F (36.5 C)  SpO2: 99%     Body mass index is 23.3 kg/m.   Wt Readings from Last 3 Encounters:  03/03/18 181 lb 8 oz (82.3 kg)  02/17/18 185 lb 9.6 oz (84.2 kg)  02/10/18 183 lb 14.4 oz (83.4 kg)      ECOG FS:1 - Symptomatic but completely ambulatory  Has lost most of his hair; rosacea is improved Sclerae unicteric, pupils round and equal Oropharynx clear and moist No cervical or supraclavicular  adenopathy, no axillary or inguinal adenopathy Lungs no rales or rhonchi Heart regular rate and rhythm Abd soft, nontender, positive bowel sounds MSK no focal spinal tenderness, no upper extremity lymphedema Neuro: nonfocal, well oriented, appropriate affect  LAB RESULTS:  CMP     Component Value Date/Time   NA 141 02/17/2018 1518   K 4.3 02/17/2018 1518   CL 104 02/17/2018 1518   CO2 28 02/17/2018 1518   GLUCOSE 95 02/17/2018 1518   BUN 14 02/17/2018 1518   CREATININE 0.71 02/17/2018 1518   CALCIUM 9.0 02/17/2018 1518   PROT 6.8 02/17/2018 1518   ALBUMIN 3.7 02/17/2018 1518   AST 17 02/17/2018 1518   ALT 13 02/17/2018 1518   ALKPHOS 110 02/17/2018 1518   BILITOT 0.9 02/17/2018 1518   GFRNONAA >60 02/17/2018 1518   GFRAA >60 02/17/2018 1518    No results found for: TOTALPROTELP, ALBUMINELP, A1GS, A2GS, BETS, BETA2SER, GAMS, MSPIKE, SPEI  No results found for: Nils Pyle, [facility]  Lab Results  Component Value Date   WBC 16.6 (H) 02/17/2018   NEUTROABS 13.1 (H) 02/17/2018   HGB 13.3 02/17/2018   HCT 40.6 02/17/2018   MCV 93.3 02/17/2018   PLT 156 02/17/2018      Chemistry      Component Value Date/Time   NA 141 02/17/2018 1518   K 4.3 02/17/2018 1518   CL 104 02/17/2018 1518   CO2 28 02/17/2018 1518   BUN 14 02/17/2018 1518   CREATININE 0.71 02/17/2018 1518      Component Value Date/Time   CALCIUM 9.0 02/17/2018 1518   ALKPHOS 110 02/17/2018 1518   AST 17 02/17/2018 1518   ALT 13 02/17/2018 1518   BILITOT 0.9 02/17/2018 1518       No results found for: LABCA2  No components found for: ACZYSA630  No results for input(s): INR in the last 168 hours.  No results found for: LABCA2  No results found for: ZSW109  No results found for: NAT557  No results found for: DUK025  No results found for: CA2729  No components found for: HGQUANT  No results found for: CEA1 / No results found for: CEA1   No results found for:  AFPTUMOR  No results found for: [place]  No results  found for: PSA1  No visits with results within 3 Day(s) from this visit.  Latest known visit with results is:  Hospital Outpatient Visit on 02/27/2018  Component Date Value Ref Range Status  . Glucose, CSF 02/27/2018 60  40 - 70 mg/dL Final   Performed at [facility], [place] [address] [place], [place] [address]  . Total  Protein, CSF 02/27/2018 64* 15 - 45 mg/dL Final   Performed at [facility] [address] [place], [place] [address]  . Tube # 02/27/2018 4   Final  . Color, CSF 02/27/2018 COLORLESS  COLORLESS Final  . Appearance, CSF 02/27/2018 CLEAR* CLEAR Final  . Supernatant 02/27/2018 NOT INDICATED   Final  . RBC Count, CSF 02/27/2018 1* 0 /cu mm Final  . WBC, CSF 02/27/2018 2  0 - 5 /cu mm Final  . Other Cells, CSF 02/27/2018 TOO FEW TO COUNT, SMEAR AVAILABLE FOR REVIEW   Final   Comment: RARE LYMPHOCYTES AND MONOCYTES PRESENT. Performed at [facility], [place] [address], [place], [place] [address]   . Specimen Description 02/27/2018 CSF   Final  . Special Requests 02/27/2018 NONE   Final  . Gram Stain 02/27/2018    Final                   Value:NO WBC SEEN NO ORGANISMS SEEN CYTOSPIN SMEAR Gram Stain Report Called to,Read Back By and Verified With: Cala Bradford RN '@[id number]'  ON 12.19.19 BY [facility] Performed at [facility], [place] [address] [place],  [address]   . Report Status 02/27/2018 02/27/2018 FINAL   Final    (this displays the last labs from the last 3 days)  No results found for: TOTALPROTELP, ALBUMINELP, A1GS, A2GS, BETS, BETA2SER, GAMS, MSPIKE, SPEI (this displays SPEP labs)  No results found for: KPAFRELGTCHN, LAMBDASER, KAPLAMBRATIO (kappa/lambda light chains)  No results found for: HGBA, HGBA2QUANT, HGBFQUANT, HGBSQUAN (Hemoglobinopathy evaluation)   Lab Results  Component Value Date   LDH 259 (H) 02/17/2018    No  results found for: IRON, TIBC, IRONPCTSAT (Iron and TIBC)  No results found for: FERRITIN  Urinalysis No results found for: COLORURINE, APPEARANCEUR, LABSPEC, PHURINE, GLUCOSEU, HGBUR, BILIRUBINUR, KETONESUR, PROTEINUR, UROBILINOGEN, NITRITE, LEUKOCYTESUR   STUDIES: Dg Fluoro Guided Loc Of Needle/cath Tip For Spinal Inject Lt  Result Date: 02/27/2018 CLINICAL DATA:  Large cell malignant lymphoma with testicular involvement EXAM: DIAGNOSTIC LUMBAR PUNCTURE UNDER FLUOROSCOPIC GUIDANCE FLUOROSCOPY TIME:  Fluoroscopy Time:  0 minutes, 18 seconds Radiation Exposure Index (if provided by the fluoroscopic device): 1.2 mGy Number of Acquired Spot Images: 0 PROCEDURE: I discussed the risks (including hemorrhage, infection, headache, and nerve damage, among others), benefits, and alternatives to fluoroscopically guided lumbar puncture with the patient. We specifically discussed the high technical likelihood of success of the procedure. The patient understood and elected to undergo the procedure. Standard time-out was employed. Following sterile skin prep and local anesthetic administration consisting of 1 percent lidocaine, a 20 gauge spinal needle was advanced without difficulty into the thecal sac at the at the L5-S1 level. Clear CSF was returned. Opening pressure was 7 cm of water. 12 cc of clear CSF was collected. The needle was subsequently removed and the skin cleansed and bandaged. No immediate complications were observed. IMPRESSION: Technically successful fluoroscopically guided lumbar puncture yielding 12 cc of clear CSF for diagnostic purposes. Electronically Signed   By: Van Clines M.D.   On: 02/27/2018 12:19     ELIGIBLE  FOR AVAILABLE RESEARCH PROTOCOL: no  ASSESSMENT: 72 y.o. Jehovah's Witness status post left inguinal orchiectomy 12/20/2017 for a diffuse large cell non-Hodgkin's lymphoma, germinal center B-cell subtype, CD20 positive, CD5 negative.  (1) staging studies: (a) PET  scan 12/31/2017: Uptake only in the right testicle and panniculus, both of questionable significance (b) bone marrow biopsy 01/01/2018 shows no evidence of lymphoma (c) lumbar puncture 02/27/2018 benign  (2) international prognostic index: 2 = 80% predicted PFS4; stage I (or II if panniculus involved)  (3) cyclophosphamide, doxorubicin, vincristine, prednisone, rituximab started 01/16/2018, completed 03/03/2018 (a) plan is for 3 cycles of CHOP-R, then adjuvant radiation therapy   PLAN: Michael Allen will receive his third cycle of chemotherapy today.  We discussed possibly moving it back a week since he is still having mild symptoms from his lumbar puncture.  However he is very eager to get it done with and I do not think there is a strong reason to delay.  As far as the post LP symptoms are concerned he will continue to hydrate himself vigorously.  I think the prednisone he will start today will also be helpful.  He is taking Tylenol occasionally for symptom relief and he certainly may continue that as well  He will see me again next week.  If his counts are low he will receive Cipro again.  During that visit I will set him up to see Dr. Randa Ngo at mid January.  I suspect he will be starting chemotherapy late January and receive it through February.  In fact that the patient has an appointment all day January 24 and he would prefer to start radiation after that, likely January 27.  I am going to obtain a baseline testosterone level next week.  After he completes his radiation I will follow-up on that and supplement as needed  He knows to call for any other issue that may develop before the next visit.   Magrinat, [name], MD  03/03/18 11:48 AM Medical Oncology and Hematology [facility] [address] [place], [place] [address] Tel. [phone]    Fax. [phone]    I, Jacqualyn Posey am acting as a Education administrator for [name], MD.   I, Lurline Del MD, have reviewed the  above documentation for accuracy and completeness, and I agree with the above.

## 2018-03-03 ENCOUNTER — Inpatient Hospital Stay (HOSPITAL_BASED_OUTPATIENT_CLINIC_OR_DEPARTMENT_OTHER): Payer: Medicare Other | Admitting: Oncology

## 2018-03-03 ENCOUNTER — Inpatient Hospital Stay: Payer: Medicare Other

## 2018-03-03 ENCOUNTER — Telehealth: Payer: Self-pay | Admitting: Oncology

## 2018-03-03 VITALS — BP 129/72 | HR 84 | Temp 97.7°F | Resp 17 | Ht 74.0 in | Wt 181.5 lb

## 2018-03-03 VITALS — BP 115/83 | HR 63 | Temp 97.8°F | Resp 18

## 2018-03-03 DIAGNOSIS — N200 Calculus of kidney: Secondary | ICD-10-CM

## 2018-03-03 DIAGNOSIS — K5730 Diverticulosis of large intestine without perforation or abscess without bleeding: Secondary | ICD-10-CM

## 2018-03-03 DIAGNOSIS — N1330 Unspecified hydronephrosis: Secondary | ICD-10-CM

## 2018-03-03 DIAGNOSIS — Z5111 Encounter for antineoplastic chemotherapy: Secondary | ICD-10-CM | POA: Diagnosis not present

## 2018-03-03 DIAGNOSIS — C8335 Diffuse large B-cell lymphoma, lymph nodes of inguinal region and lower limb: Secondary | ICD-10-CM | POA: Diagnosis not present

## 2018-03-03 DIAGNOSIS — C8580 Other specified types of non-Hodgkin lymphoma, unspecified site: Principal | ICD-10-CM

## 2018-03-03 DIAGNOSIS — Z87891 Personal history of nicotine dependence: Secondary | ICD-10-CM

## 2018-03-03 DIAGNOSIS — C8330 Diffuse large B-cell lymphoma, unspecified site: Secondary | ICD-10-CM

## 2018-03-03 DIAGNOSIS — C6210 Malignant neoplasm of unspecified descended testis: Secondary | ICD-10-CM

## 2018-03-03 DIAGNOSIS — J8410 Pulmonary fibrosis, unspecified: Secondary | ICD-10-CM

## 2018-03-03 DIAGNOSIS — I700 Atherosclerosis of aorta: Secondary | ICD-10-CM

## 2018-03-03 LAB — CBC WITH DIFFERENTIAL/PLATELET
Abs Immature Granulocytes: 0.15 10*3/uL — ABNORMAL HIGH (ref 0.00–0.07)
Basophils Absolute: 0 10*3/uL (ref 0.0–0.1)
Basophils Relative: 0 %
EOS PCT: 0 %
Eosinophils Absolute: 0 10*3/uL (ref 0.0–0.5)
HCT: 41.9 % (ref 39.0–52.0)
HEMOGLOBIN: 13.9 g/dL (ref 13.0–17.0)
Immature Granulocytes: 2 %
LYMPHS PCT: 14 %
Lymphs Abs: 1.4 10*3/uL (ref 0.7–4.0)
MCH: 31 pg (ref 26.0–34.0)
MCHC: 33.2 g/dL (ref 30.0–36.0)
MCV: 93.5 fL (ref 80.0–100.0)
Monocytes Absolute: 1 10*3/uL (ref 0.1–1.0)
Monocytes Relative: 10 %
Neutro Abs: 7.3 10*3/uL (ref 1.7–7.7)
Neutrophils Relative %: 74 %
Platelets: 289 10*3/uL (ref 150–400)
RBC: 4.48 MIL/uL (ref 4.22–5.81)
RDW: 17.2 % — ABNORMAL HIGH (ref 11.5–15.5)
WBC: 9.9 10*3/uL (ref 4.0–10.5)
nRBC: 0 % (ref 0.0–0.2)

## 2018-03-03 LAB — COMPREHENSIVE METABOLIC PANEL
ALT: 18 U/L (ref 0–44)
AST: 18 U/L (ref 15–41)
Albumin: 3.8 g/dL (ref 3.5–5.0)
Alkaline Phosphatase: 93 U/L (ref 38–126)
Anion gap: 10 (ref 5–15)
BUN: 17 mg/dL (ref 8–23)
CO2: 21 mmol/L — ABNORMAL LOW (ref 22–32)
Calcium: 9.2 mg/dL (ref 8.9–10.3)
Chloride: 109 mmol/L (ref 98–111)
Creatinine, Ser: 0.8 mg/dL (ref 0.61–1.24)
GFR calc Af Amer: >60 mL/min (ref >60)
GFR calc non Af Amer: >60 mL/min (ref >60)
Glucose, Bld: 105 mg/dL — ABNORMAL HIGH (ref 70–99)
Potassium: 4.2 mmol/L (ref 3.5–5.1)
Sodium: 140 mmol/L (ref 135–145)
Total Bilirubin: 0.3 mg/dL (ref 0.3–1.2)
Total Protein: 7.2 g/dL (ref 6.5–8.1)

## 2018-03-03 LAB — LACTATE DEHYDROGENASE: LDH: 179 U/L (ref 98–192)

## 2018-03-03 LAB — URIC ACID: URIC ACID, SERUM: 3.2 mg/dL — AB (ref 3.7–8.6)

## 2018-03-03 MED ORDER — SODIUM CHLORIDE 0.9 % IV SOLN
Freq: Once | INTRAVENOUS | Status: AC
  Administered 2018-03-03: 13:00:00 via INTRAVENOUS
  Filled 2018-03-03: qty 250

## 2018-03-03 MED ORDER — HEPARIN SOD (PORK) LOCK FLUSH 100 UNIT/ML IV SOLN
500.0000 [IU] | Freq: Once | INTRAVENOUS | Status: DC | PRN
  Filled 2018-03-03: qty 5

## 2018-03-03 MED ORDER — SODIUM CHLORIDE 0.9 % IV SOLN
750.0000 mg/m2 | Freq: Once | INTRAVENOUS | Status: AC
  Administered 2018-03-03: 1560 mg via INTRAVENOUS
  Filled 2018-03-03: qty 78

## 2018-03-03 MED ORDER — DIPHENHYDRAMINE HCL 25 MG PO CAPS
25.0000 mg | ORAL_CAPSULE | Freq: Once | ORAL | Status: AC
  Administered 2018-03-03: 25 mg via ORAL

## 2018-03-03 MED ORDER — DOXORUBICIN HCL CHEMO IV INJECTION 2 MG/ML
50.0000 mg/m2 | Freq: Once | INTRAVENOUS | Status: AC
  Administered 2018-03-03: 104 mg via INTRAVENOUS
  Filled 2018-03-03: qty 52

## 2018-03-03 MED ORDER — SODIUM CHLORIDE 0.9 % IV SOLN
375.0000 mg/m2 | Freq: Once | INTRAVENOUS | Status: AC
  Administered 2018-03-03: 800 mg via INTRAVENOUS
  Filled 2018-03-03: qty 30

## 2018-03-03 MED ORDER — VINCRISTINE SULFATE CHEMO INJECTION 1 MG/ML
1.0000 mg | Freq: Once | INTRAVENOUS | Status: AC
  Administered 2018-03-03: 1 mg via INTRAVENOUS
  Filled 2018-03-03: qty 1

## 2018-03-03 MED ORDER — ACETAMINOPHEN 325 MG PO TABS
650.0000 mg | ORAL_TABLET | Freq: Once | ORAL | Status: AC
  Administered 2018-03-03: 650 mg via ORAL

## 2018-03-03 MED ORDER — PALONOSETRON HCL INJECTION 0.25 MG/5ML
INTRAVENOUS | Status: AC
  Filled 2018-03-03: qty 5

## 2018-03-03 MED ORDER — SODIUM CHLORIDE 0.9% FLUSH
10.0000 mL | INTRAVENOUS | Status: DC | PRN
  Filled 2018-03-03: qty 10

## 2018-03-03 MED ORDER — PALONOSETRON HCL INJECTION 0.25 MG/5ML
0.2500 mg | Freq: Once | INTRAVENOUS | Status: AC
  Administered 2018-03-03: 0.25 mg via INTRAVENOUS

## 2018-03-03 MED ORDER — DIPHENHYDRAMINE HCL 25 MG PO CAPS
ORAL_CAPSULE | ORAL | Status: AC
  Filled 2018-03-03: qty 1

## 2018-03-03 MED ORDER — SODIUM CHLORIDE 0.9 % IV SOLN
Freq: Once | INTRAVENOUS | Status: AC
  Administered 2018-03-03: 13:00:00 via INTRAVENOUS
  Filled 2018-03-03: qty 5

## 2018-03-03 MED ORDER — ACETAMINOPHEN 325 MG PO TABS
ORAL_TABLET | ORAL | Status: AC
  Filled 2018-03-03: qty 2

## 2018-03-03 NOTE — Patient Instructions (Signed)
[facility] Discharge Instructions for Patients Receiving Chemotherapy  Today you received the following chemotherapy agents adriamycin (Doxorubicin), vincristine (Oncovin), cyclophosphamide (Cytoxan), rituximab (Rituxan)  To help prevent nausea and vomiting after your treatment, we encourage you to take your nausea medication as directed.   If you develop nausea and vomiting that is not controlled by your nausea medication, call the clinic.   BELOW ARE SYMPTOMS THAT SHOULD BE REPORTED IMMEDIATELY:  *FEVER GREATER THAN 100.5 F  *CHILLS WITH OR WITHOUT FEVER  NAUSEA AND VOMITING THAT IS NOT CONTROLLED WITH YOUR NAUSEA MEDICATION  *UNUSUAL SHORTNESS OF BREATH  *UNUSUAL BRUISING OR BLEEDING  TENDERNESS IN MOUTH AND THROAT WITH OR WITHOUT PRESENCE OF ULCERS  *URINARY PROBLEMS  *BOWEL PROBLEMS  UNUSUAL RASH Items with * indicate a potential emergency and should be followed up as soon as possible.  Feel free to call the clinic should you have any questions or concerns. The clinic phone number is [phone].  Please show the [place] at check-in to the Emergency Department and triage nurse.

## 2018-03-03 NOTE — Telephone Encounter (Signed)
Per 12/23 no los 

## 2018-03-03 NOTE — Progress Notes (Signed)
[facility] Progress Note   Michael Allen [id number] 1945/07/20 72 y.o.  Michael Allen is managed by Dr. Jana Hakim  Actively treated with chemotherapy/immunotherapy/hormonal therapy: yes  Current Therapy: R-CHOP  Last Treated: 02/11/2018 (cycle 2)  Assessment: Plan:    Drug rash  Diffuse large cell non-Hodgkin's lymphoma (HCC)   Facial drug rash: The patient was instructed to use over-the-counter hydrocortisone cream on his face as needed.  He will return or call if his condition worsens.  Diffuse large cell non-Hodgkin's lymphoma: The patient is status post cycle 2 of R-CHOP which was dosed on 02/11/2018.  He is status post a lumbar puncture earlier today.  He was seen in the infusion room.  He is scheduled to return for treatment on 03/03/2018 and will see Dr. Jana Hakim in follow-up on 03/11/2018.  Please see After Visit Summary for patient specific instructions.  Future Appointments  Date Time Provider [place]  03/06/2018  2:30 PM [facility] None  03/11/2018  1:00 PM Magrinat, [name], MD [facility] None    No orders of the defined types were placed in this encounter.      Subjective:   Patient ID:  Michael Allen is a 72 y.o. (DOB 1946/03/10) male.  Chief Complaint: No chief complaint on file.   HPI Michael Allen is a 72 year old male with a history of a diffuse large cell non-Hodgkin's lymphoma who is managed by Dr. Jana Hakim and is status post cycle 2 of R-CHOP which was dosed on 02/11/2018.  He is status post a lumbar puncture earlier today.  He was seen in the infusion room with a report of a facial rash which has occurred over his forehead and bilateral cheeks over the past week to 10 days.  The area is erythematous with diffuse pustules.  He has not had any change in personal hygiene products or medications.  Medications: I have reviewed the patient's current medications.  Allergies:    Allergies  Allergen Reactions  . Blood-Group Specific Substance   . Oxycodone   . Penicillins   . Shellfish Allergy Hives    Shell fish and shrimp    Past Medical History:  Diagnosis Date  . Chalazion   . Dental crowns present   . GERD (gastroesophageal reflux disease)   . History of kidney stones 10/25/2017   right nonobstructing stone noted on CT ABD/Pelvis  . Hydronephrosis 10/25/2017   Mild to moderate right due to 24mm stone right distal, noted on CT abd/pelvis  . Hydroureter, right 10/25/2017   41mm stone right distal, noted on CT abd/pelvis  . Impaired fasting glucose   . Migraine    optical  . Plantar fasciitis 2003  . PONV (postoperative nausea and vomiting)    light  . Pure hyperglyceridemia   . Rectal bleeding 2002  . Recurrent spontaneous pneumothorax    due to blebsx2  . Skin cancer of scalp 2011  . Transfusion of blood product refused for religious reason   . Ureteral stone 10/25/2017   41mm stone right distal, noted on CT abd/pelvis  . Wears glasses     Past Surgical History:  Procedure Laterality Date  . CHALAZION EXCISION Right    cyst removal , right eye  . COLONOSCOPY    . [place]  2002  . INGUINAL HERNIA REPAIR Bilateral 2005   wire mesh  . LUNG SURGERY     x2  . ORCHIECTOMY Left 12/20/2017   Procedure: ORCHIECTOMY;  Surgeon:  [name], MD;  Location: [facility];  Service: Urology;  Laterality: Left;  . TONSILLECTOMY      Family History  Problem Relation Age of Onset  . Dementia Mother        progressive  . Breast cancer Mother   . Heart Problems Father        pacemaker  . CAD Father   . Heart disease Father     Social History   Socioeconomic History  . Marital status: Married    Spouse name: Not on file  . Number of children: Not on file  . Years of education: Not on file  . Highest education level: Not on file  Occupational History  . Not on file  Social Needs  . Financial resource  strain: Not on file  . Food insecurity:    Worry: Not on file    Inability: Not on file  . Transportation needs:    Medical: Not on file    Non-medical: Not on file  Tobacco Use  . Smoking status: Former Research scientist (life sciences)  . Smokeless tobacco: Never Used  . Tobacco comment: age 47  Substance and Sexual Activity  . Alcohol use: Yes    Comment: occ beer  . Drug use: Never  . Sexual activity: Not on file  Lifestyle  . Physical activity:    Days per week: Not on file    Minutes per session: Not on file  . Stress: Not on file  Relationships  . Social connections:    Talks on phone: Not on file    Gets together: Not on file    Attends religious service: Not on file    Active member of club or organization: Not on file    Attends meetings of clubs or organizations: Not on file    Relationship status: Not on file  . Intimate partner violence:    Fear of current or ex partner: Not on file    Emotionally abused: Not on file    Physically abused: Not on file    Forced sexual activity: Not on file  Other Topics Concern  . Not on file  Social History Narrative  . Not on file    Past Medical History, Surgical history, Social history, and Family history were reviewed and updated as appropriate.   Please see review of systems for further details on the patient's review from today.   Review of Systems:  Review of Systems  Constitutional: Negative for chills, diaphoresis and fever.  HENT: Negative for facial swelling and trouble swallowing.   Respiratory: Negative for cough, chest tightness and shortness of breath.   Cardiovascular: Negative for chest pain.  Skin: Positive for rash.    Objective:   Physical Exam:  There were no vitals taken for this visit. ECOG: 0  Physical Exam Constitutional:      Appearance: Normal appearance.     Comments: The patient is an elderly male who is supine in a hospital bed.  He appears to be in no acute distress.  HENT:     Head: Normocephalic and  atraumatic.  Skin:    General: Skin is warm and dry.     Findings: Erythema and rash present.     Comments: Diffuse erythema over the forehead and cheeks bilaterally with multiple small pustules.  Neurological:     Mental Status: He is alert.     Lab Review:     Component Value Date/Time   NA 140 03/03/2018 1150  K 4.2 03/03/2018 1150   CL 109 03/03/2018 1150   CO2 21 (L) 03/03/2018 1150   GLUCOSE 105 (H) 03/03/2018 1150   BUN 17 03/03/2018 1150   CREATININE 0.80 03/03/2018 1150   CALCIUM 9.2 03/03/2018 1150   PROT 7.2 03/03/2018 1150   ALBUMIN 3.8 03/03/2018 1150   AST 18 03/03/2018 1150   ALT 18 03/03/2018 1150   ALKPHOS 93 03/03/2018 1150   BILITOT 0.3 03/03/2018 1150   GFRNONAA >60 03/03/2018 1150   GFRAA >60 03/03/2018 1150       Component Value Date/Time   WBC 9.9 03/03/2018 1150   RBC 4.48 03/03/2018 1150   HGB 13.9 03/03/2018 1150   HGB 13.8 01/01/2018 0843   HCT 41.9 03/03/2018 1150   PLT 289 03/03/2018 1150   PLT 192 01/01/2018 0843   MCV 93.5 03/03/2018 1150   MCH 31.0 03/03/2018 1150   MCHC 33.2 03/03/2018 1150   RDW 17.2 (H) 03/03/2018 1150   LYMPHSABS 1.4 03/03/2018 1150   MONOABS 1.0 03/03/2018 1150   EOSABS 0.0 03/03/2018 1150   BASOSABS 0.0 03/03/2018 1150   -------------------------------  Imaging from last 24 hours (if applicable):  Radiology interpretation: Dg Fluoro Guided Loc Of Needle/cath Tip For Spinal Inject Lt  Result Date: 02/27/2018 CLINICAL DATA:  Large cell malignant lymphoma with testicular involvement EXAM: DIAGNOSTIC LUMBAR PUNCTURE UNDER FLUOROSCOPIC GUIDANCE FLUOROSCOPY TIME:  Fluoroscopy Time:  0 minutes, 18 seconds Radiation Exposure Index (if provided by the fluoroscopic device): 1.2 mGy Number of Acquired Spot Images: 0 PROCEDURE: I discussed the risks (including hemorrhage, infection, headache, and nerve damage, among others), benefits, and alternatives to fluoroscopically guided lumbar puncture with the patient. We  specifically discussed the high technical likelihood of success of the procedure. The patient understood and elected to undergo the procedure. Standard time-out was employed. Following sterile skin prep and local anesthetic administration consisting of 1 percent lidocaine, a 20 gauge spinal needle was advanced without difficulty into the thecal sac at the at the L5-S1 level. Clear CSF was returned. Opening pressure was 7 cm of water. 12 cc of clear CSF was collected. The needle was subsequently removed and the skin cleansed and bandaged. No immediate complications were observed. IMPRESSION: Technically successful fluoroscopically guided lumbar puncture yielding 12 cc of clear CSF for diagnostic purposes. Electronically Signed   By: Damek Ende Clines M.D.   On: 02/27/2018 12:19

## 2018-03-04 LAB — TESTOSTERONE: Testosterone: 630 ng/dL (ref 264–916)

## 2018-03-06 ENCOUNTER — Inpatient Hospital Stay: Payer: Medicare Other

## 2018-03-06 DIAGNOSIS — C8580 Other specified types of non-Hodgkin lymphoma, unspecified site: Principal | ICD-10-CM

## 2018-03-06 DIAGNOSIS — C8330 Diffuse large B-cell lymphoma, unspecified site: Secondary | ICD-10-CM

## 2018-03-06 DIAGNOSIS — Z5111 Encounter for antineoplastic chemotherapy: Secondary | ICD-10-CM | POA: Diagnosis not present

## 2018-03-06 MED ORDER — PEGFILGRASTIM-CBQV 6 MG/0.6ML ~~LOC~~ SOSY
PREFILLED_SYRINGE | SUBCUTANEOUS | Status: AC
  Filled 2018-03-06: qty 0.6

## 2018-03-06 MED ORDER — PEGFILGRASTIM-CBQV 6 MG/0.6ML ~~LOC~~ SOSY
6.0000 mg | PREFILLED_SYRINGE | Freq: Once | SUBCUTANEOUS | Status: AC
  Administered 2018-03-06: 6 mg via SUBCUTANEOUS

## 2018-03-06 NOTE — Patient Instructions (Signed)
Pegfilgrastim injection  What is this medicine?  PEGFILGRASTIM (PEG fil gra stim) is a long-acting granulocyte colony-stimulating factor that stimulates the growth of neutrophils, a type of white blood cell important in the body's fight against infection. It is used to reduce the incidence of fever and infection in patients with certain types of cancer who are receiving chemotherapy that affects the bone marrow, and to increase survival after being exposed to high doses of radiation.  This medicine may be used for other purposes; ask your health care provider or pharmacist if you have questions.  COMMON BRAND NAME(S): Fulphila, Neulasta, UDENYCA  What should I tell my health care provider before I take this medicine?  They need to know if you have any of these conditions:  -kidney disease  -latex allergy  -ongoing radiation therapy  -sickle cell disease  -skin reactions to acrylic adhesives (On-Body Injector only)  -an unusual or allergic reaction to pegfilgrastim, filgrastim, other medicines, foods, dyes, or preservatives  -pregnant or trying to get pregnant  -breast-feeding  How should I use this medicine?  This medicine is for injection under the skin. If you get this medicine at home, you will be taught how to prepare and give the pre-filled syringe or how to use the On-body Injector. Refer to the patient Instructions for Use for detailed instructions. Use exactly as directed. Tell your healthcare provider immediately if you suspect that the On-body Injector may not have performed as intended or if you suspect the use of the On-body Injector resulted in a missed or partial dose.  It is important that you put your used needles and syringes in a special sharps container. Do not put them in a trash can. If you do not have a sharps container, call your pharmacist or healthcare provider to get one.  Talk to your pediatrician regarding the use of this medicine in children. While this drug may be prescribed for  selected conditions, precautions do apply.  Overdosage: If you think you have taken too much of this medicine contact a poison control center or emergency room at once.  NOTE: This medicine is only for you. Do not share this medicine with others.  What if I miss a dose?  It is important not to miss your dose. Call your doctor or health care professional if you miss your dose. If you miss a dose due to an On-body Injector failure or leakage, a new dose should be administered as soon as possible using a single prefilled syringe for manual use.  What may interact with this medicine?  Interactions have not been studied.  Give your health care provider a list of all the medicines, herbs, non-prescription drugs, or dietary supplements you use. Also tell them if you smoke, drink alcohol, or use illegal drugs. Some items may interact with your medicine.  This list may not describe all possible interactions. Give your health care provider a list of all the medicines, herbs, non-prescription drugs, or dietary supplements you use. Also tell them if you smoke, drink alcohol, or use illegal drugs. Some items may interact with your medicine.  What should I watch for while using this medicine?  You may need blood work done while you are taking this medicine.  If you are going to need a MRI, CT scan, or other procedure, tell your doctor that you are using this medicine (On-Body Injector only).  What side effects may I notice from receiving this medicine?  Side effects that you should report to   your doctor or health care professional as soon as possible:  -allergic reactions like skin rash, itching or hives, swelling of the face, lips, or tongue  -back pain  -dizziness  -fever  -pain, redness, or irritation at site where injected  -pinpoint red spots on the skin  -red or dark-brown urine  -shortness of breath or breathing problems  -stomach or side pain, or pain at the shoulder  -swelling  -tiredness  -trouble passing urine or  change in the amount of urine  Side effects that usually do not require medical attention (report to your doctor or health care professional if they continue or are bothersome):  -bone pain  -muscle pain  This list may not describe all possible side effects. Call your doctor for medical advice about side effects. You may report side effects to FDA at 1-800-FDA-1088.  Where should I keep my medicine?  Keep out of the reach of children.  If you are using this medicine at home, you will be instructed on how to store it. Throw away any unused medicine after the expiration date on the label.  NOTE: This sheet is a summary. It may not cover all possible information. If you have questions about this medicine, talk to your doctor, pharmacist, or health care provider.   2019 Elsevier/Gold Standard (2017-06-03 16:57:08)

## 2018-03-10 ENCOUNTER — Other Ambulatory Visit: Payer: Self-pay

## 2018-03-10 DIAGNOSIS — C8580 Other specified types of non-Hodgkin lymphoma, unspecified site: Principal | ICD-10-CM

## 2018-03-10 DIAGNOSIS — C8330 Diffuse large B-cell lymphoma, unspecified site: Secondary | ICD-10-CM

## 2018-03-10 NOTE — Progress Notes (Signed)
[place]  Telephone:(336) [phone] Fax:(336) [phone]     ID: Michael Allen DOB: Oct 10, 1945  MR#: [id number]  YIR#:[id number]  Patient Care Team: Seward Carol, MD as PCP - General (Internal Medicine) [name], MD as Consulting Physician (Urology) Ryin Schillo, [name], MD as Consulting Physician (Oncology) [name], MD as Consulting Physician (Ophthalmology) [name], MD as Consulting Physician (Radiation Oncology) [facility] OTHER MD:  CHIEF COMPLAINT: Diffuse large cell non-Hodgkin's lymphoma, germinal center B-cell subtype  CURRENT TREATMENT: Completedchemotherapy, radiation pending   HISTORY OF CURRENT ILLNESS: From the original intake note 12/27/2017:  "The patient was referred to Dr. [name] for renal stones.  Incidentally he was noted to have left scrotal swelling.  This had been present several months and was initially felt to be a hydrocele.  Scrotal ultrasound was performed 12/06/2017.  This found no spermatocele but a solid mass in the left testis upper and lower pole.  Accordingly on 12/20/2017 the patient underwent left inguinal orchiectomy.  The pathology from this procedure (SZ [id number]) showed a diffuse large B cell non-Hodgkin's lymphoma, CD20 positive, CD10 weakly positive, no coexpression of CD5, and most consistent with a germinal center B-cell subtype.  I was called by Dr. [name] earlier today and the patient was brought in for further evaluation."  His subsequent history is as detailed below.  INTERVAL HISTORY: Janes returns today for follow-up of his large cell B-cell non-Hodgkin's lymphoma. He is accompanied by his wife.  The patient received his last dose of CHOP-R chemotherapy on 03/03/2018. Today is day 8 cycle 3 of 3 planned cycles.   Since his last visit here on 03/03/2018, he has not undergone any studies.   REVIEW OF SYSTEMS:  Yesterday (03/10/2018), Rieley was very fatigued and felt some head  pressure; he was also febrile, subjectively measured with thermometer at 100 degrees fahrenheit. Today (03/11/2018), he feels much better. The patient denies visual changes, nausea, vomiting, or dizziness. There has been no unusual cough, phlegm production, or pleurisy. This been no change in bowel or bladder habits. The patient denies unexplained weight loss, bleeding, or rash. A detailed review of systems was otherwise noncontributory.    PAST MEDICAL HISTORY: Past Medical History:  Diagnosis Date  . Chalazion   . Dental crowns present   . GERD (gastroesophageal reflux disease)   . History of kidney stones 10/25/2017   right nonobstructing stone noted on CT ABD/Pelvis  . Hydronephrosis 10/25/2017   Mild to moderate right due to 45m stone right distal, noted on CT abd/pelvis  . Hydroureter, right 10/25/2017   457mstone right distal, noted on CT abd/pelvis  . Impaired fasting glucose   . Migraine    optical  . Plantar fasciitis 2003  . PONV (postoperative nausea and vomiting)    light  . Pure hyperglyceridemia   . Rectal bleeding 2002  . Recurrent spontaneous pneumothorax    due to blebsx2  . Skin cancer of scalp 2011  . Transfusion of blood product refused for religious reason   . Ureteral stone 10/25/2017   9m2mtone right distal, noted on CT abd/pelvis  . Wears glasses     PAST SURGICAL HISTORY: Past Surgical History:  Procedure Laterality Date  . CHALAZION EXCISION Right    cyst removal , right eye  . COLONOSCOPY    . [id number]  . INGUINAL HERNIA REPAIR Bilateral 2005   wire mesh  . LUNG SURGERY     x2  . ORCHIECTOMY Left 12/20/2017  Procedure: ORCHIECTOMY;  Surgeon: [name], MD;  Location: [facility];  Service: Urology;  Laterality: Left;  . TONSILLECTOMY      FAMILY HISTORY Family History  Problem Relation Age of Onset  . Dementia Mother        progressive  . Breast cancer Mother   . Heart Problems Father         pacemaker  . CAD Father   . Heart disease Father    The patient's father died from heart and lung problems in his 3s.  The patient's mother had breast cancer in her 38s, died in her 75s from Alzheimer's disease.  The patient had no brothers, 2 sisters.  One sister has what sounds like lung cancer metastatic to the brain, in her 20s.  One maternal cousin was diagnosed with Ms. to have been breast cancer metastatic to the lung in her 51s.   SOCIAL HISTORY:  The patient retired from [employer] more than 20 years ago.  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   [name] is an education major but mostly worked as a housewife.  [name] is a "mom", and sign language interpreted as well as very active in the natural path movement.  She lives in [place].  Son Julez lives in [place] where he works for the [employer].  The patient has 2 grandchildren   ADVANCED DIRECTIVES: Please note patient would refuse a blood transfusion even for lifesaving reasons (discussed 12/27/2017)   HEALTH MAINTENANCE: Social History   Tobacco Use  . Smoking status: Former Research scientist (life sciences)  . Smokeless tobacco: Never Used  . Tobacco comment: age 45  Substance Use Topics  . Alcohol use: Yes    Comment: occ beer  . Drug use: Never     Colonoscopy: 2015  PSA:  Bone density:   Allergies  Allergen Reactions  . Blood-Group Specific Substance   . Oxycodone   . Penicillins   . Shellfish Allergy Hives    Shell fish and shrimp    Current Outpatient Medications  Medication Sig Dispense Refill  . allopurinol (ZYLOPRIM) 300 MG tablet Take 1 tablet (300 mg total) by mouth daily. 30 tablet 5  . ciprofloxacin (CIPRO) 500 MG tablet Take 1 tablet (500 mg total) by mouth 2 (two) times daily. 30 tablet 0  . metroNIDAZOLE (METROGEL) 1 % gel Apply topically daily. 45 g 0  . ondansetron (ZOFRAN) 8 MG tablet Take 1 tablet (8 mg total) by mouth 2 (two) times daily as needed for refractory nausea / vomiting.  Start on day 3 after cyclophosphamide chemotherapy. 30 tablet 1  . predniSONE (DELTASONE) 20 MG tablet Take 3 tablets (60 mg total) by mouth daily. Take on days 1-5 of chemotherapy. 50 tablet 6  . prochlorperazine (COMPAZINE) 10 MG tablet Take 1 tablet (10 mg total) by mouth every 6 (six) hours as needed (Nausea or vomiting). 30 tablet 6  . valACYclovir (VALTREX) 500 MG tablet Take 1 tablet (500 mg total) by mouth 2 (two) times daily. 90 tablet 3   No current facility-administered medications for this visit.     OBJECTIVE: Middle-aged white man who appears well   Vitals:   03/11/18 1259  BP: (!) 114/57  Pulse: (!) 102  Resp: 17  Temp: 98.3 F (36.8 C)  SpO2: 99%     Body mass index is 23.24 kg/m.   Wt Readings from Last 3 Encounters:  03/11/18 181 lb (82.1 kg)  03/03/18 181  lb 8 oz (82.3 kg)  02/17/18 185 lb 9.6 oz (84.2 kg)      ECOG FS:1 - Symptomatic but completely ambulatory  Sclerae unicteric, EOMs intact Oropharynx clear and moist No cervical or supraclavicular adenopathy, neck is supple Lungs no rales or rhonchi Heart regular rate and rhythm Abd soft, nontender, positive bowel sounds MSK no focal spinal tenderness, no upper extremity lymphedema Neuro: nonfocal, well oriented, appropriate affect Skin: Rosacea resolved  LAB RESULTS:  CMP     Component Value Date/Time   NA 138 03/11/2018 1205   K 3.8 03/11/2018 1205   CL 103 03/11/2018 1205   CO2 26 03/11/2018 1205   GLUCOSE 134 (H) 03/11/2018 1205   BUN 15 03/11/2018 1205   CREATININE 0.74 03/11/2018 1205   CALCIUM 8.7 (L) 03/11/2018 1205   PROT 6.7 03/11/2018 1205   ALBUMIN 3.5 03/11/2018 1205   AST 15 03/11/2018 1205   ALT 14 03/11/2018 1205   ALKPHOS 83 03/11/2018 1205   BILITOT 0.8 03/11/2018 1205   GFRNONAA >60 03/11/2018 1205   GFRAA >60 03/11/2018 1205    No results found for: TOTALPROTELP, ALBUMINELP, A1GS, A2GS, BETS, BETA2SER, GAMS, MSPIKE, SPEI  No results found for: KPAFRELGTCHN,  LAMBDASER, KAPLAMBRATIO  Lab Results  Component Value Date   WBC 2.7 (L) 03/11/2018   NEUTROABS 1.9 03/11/2018   HGB 11.9 (L) 03/11/2018   HCT 36.2 (L) 03/11/2018   MCV 95.5 03/11/2018   PLT 101 (L) 03/11/2018      Chemistry      Component Value Date/Time   NA 138 03/11/2018 1205   K 3.8 03/11/2018 1205   CL 103 03/11/2018 1205   CO2 26 03/11/2018 1205   BUN 15 03/11/2018 1205   CREATININE 0.74 03/11/2018 1205      Component Value Date/Time   CALCIUM 8.7 (L) 03/11/2018 1205   ALKPHOS 83 03/11/2018 1205   AST 15 03/11/2018 1205   ALT 14 03/11/2018 1205   BILITOT 0.8 03/11/2018 1205       No results found for: LABCA2  No components found for: [id number]  No results for input(s): INR in the last 168 hours.  No results found for: LABCA2  No results found for: IEP329  No results found for: JJO841  No results found for: YSA630  No results found for: CA2729  No components found for: HGQUANT  No results found for: CEA1 / No results found for: CEA1   No results found for: AFPTUMOR  No results found for: [place]  No results found for: PSA1  Appointment on 03/11/2018  Component Date Value Ref Range Status  . WBC Count 03/11/2018 2.7* 4.0 - 10.5 K/uL Final  . RBC 03/11/2018 3.79* 4.22 - 5.81 MIL/uL Final  . Hemoglobin 03/11/2018 11.9* 13.0 - 17.0 g/dL Final  . HCT 03/11/2018 36.2* 39.0 - 52.0 % Final  . MCV 03/11/2018 95.5  80.0 - 100.0 fL Final  . MCH 03/11/2018 31.4  26.0 - 34.0 pg Final  . MCHC 03/11/2018 32.9  30.0 - 36.0 g/dL Final  . RDW 03/11/2018 16.7* 11.5 - 15.5 % Final  . Platelet Count 03/11/2018 101* 150 - 400 K/uL Final  . nRBC 03/11/2018 0.0  0.0 - 0.2 % Final  . Neutrophils Relative % 03/11/2018 69  % Final  . Neutro Abs 03/11/2018 1.9  1.7 - 7.7 K/uL Final  . Lymphocytes Relative 03/11/2018 18  % Final  . Lymphs Abs 03/11/2018 0.5* 0.7 - 4.0 K/uL Final  . Monocytes Relative 03/11/2018  8  % Final  . Monocytes Absolute 03/11/2018 0.2   0.1 - 1.0 K/uL Final  . Eosinophils Relative 03/11/2018 2  % Final  . Eosinophils Absolute 03/11/2018 0.0  0.0 - 0.5 K/uL Final  . Basophils Relative 03/11/2018 1  % Final  . Basophils Absolute 03/11/2018 0.0  0.0 - 0.1 K/uL Final  . WBC Morphology 03/11/2018 INCREASED BANDS (>20% BANDS)   Final  . Immature Granulocytes 03/11/2018 2  % Final  . Abs Immature Granulocytes 03/11/2018 0.04  0.00 - 0.07 K/uL Final  . Schistocytes 03/11/2018 PRESENT   Final   Performed at [facility], [place] [address] [place], [place] [address]  . Sodium 03/11/2018 138  135 - 145 mmol/L Final  . Potassium 03/11/2018 3.8  3.5 - 5.1 mmol/L Final  . Chloride 03/11/2018 103  98 - 111 mmol/L Final  . CO2 03/11/2018 26  22 - 32 mmol/L Final  . Glucose, Bld 03/11/2018 134* 70 - 99 mg/dL Final  . BUN 03/11/2018 15  8 - 23 mg/dL Final  . Creatinine 03/11/2018 0.74  0.61 - 1.24 mg/dL Final  . Calcium 03/11/2018 8.7* 8.9 - 10.3 mg/dL Final  . Total Protein 03/11/2018 6.7  6.5 - 8.1 g/dL Final  . Albumin 03/11/2018 3.5  3.5 - 5.0 g/dL Final  . AST 03/11/2018 15  15 - 41 U/L Final  . ALT 03/11/2018 14  0 - 44 U/L Final  . Alkaline Phosphatase 03/11/2018 83  38 - 126 U/L Final  . Total Bilirubin 03/11/2018 0.8  0.3 - 1.2 mg/dL Final  . GFR, Est Non Af Am 03/11/2018 >60  >60 mL/min Final  . GFR, Est AFR Am 03/11/2018 >60  >60 mL/min Final  . Anion gap 03/11/2018 9  5 - 15 Final   Performed at [facility], [place] [address] [place], [place] [address]  . LDH 03/11/2018 163  98 - 192 U/L Final   Performed at [facility], [place] [address], [place], [place] [address]  . Uric Acid, Serum 03/11/2018 2.4* 3.7 - 8.6 mg/dL Final   Performed at [facility], [place] [address], [place], [place] [address]    (this displays the last labs from the last 3 days)  No results found for: TOTALPROTELP, ALBUMINELP, A1GS, A2GS, BETS, BETA2SER,  GAMS, MSPIKE, SPEI (this displays SPEP labs)  No results found for: KPAFRELGTCHN, LAMBDASER, KAPLAMBRATIO (kappa/lambda light chains)  No results found for: HGBA, HGBA2QUANT, HGBFQUANT, HGBSQUAN (Hemoglobinopathy evaluation)   Lab Results  Component Value Date   LDH 163 03/11/2018    No results found for: IRON, TIBC, IRONPCTSAT (Iron and TIBC)  No results found for: FERRITIN  Urinalysis No results found for: COLORURINE, APPEARANCEUR, LABSPEC, PHURINE, GLUCOSEU, HGBUR, BILIRUBINUR, KETONESUR, PROTEINUR, UROBILINOGEN, NITRITE, LEUKOCYTESUR   STUDIES: Dg Fluoro Guided Loc Of Needle/cath Tip For Spinal Inject Lt  Result Date: 02/27/2018 CLINICAL DATA:  Large cell malignant lymphoma with testicular involvement EXAM: DIAGNOSTIC LUMBAR PUNCTURE UNDER FLUOROSCOPIC GUIDANCE FLUOROSCOPY TIME:  Fluoroscopy Time:  0 minutes, 18 seconds Radiation Exposure Index (if provided by the fluoroscopic device): 1.2 mGy Number of Acquired Spot Images: 0 PROCEDURE: I discussed the risks (including hemorrhage, infection, headache, and nerve damage, among others), benefits, and alternatives to fluoroscopically guided lumbar puncture with the patient. We specifically discussed the high technical likelihood of success of the procedure. The patient understood and elected to undergo the procedure. Standard time-out was employed. Following sterile skin prep  and local anesthetic administration consisting of 1 percent lidocaine, a 20 gauge spinal needle was advanced without difficulty into the thecal sac at the at the L5-S1 level. Clear CSF was returned. Opening pressure was 7 cm of water. 12 cc of clear CSF was collected. The needle was subsequently removed and the skin cleansed and bandaged. No immediate complications were observed. IMPRESSION: Technically successful fluoroscopically guided lumbar puncture yielding 12 cc of clear CSF for diagnostic purposes. Electronically Signed   By: Van Clines M.D.   On:  02/27/2018 12:19     ELIGIBLE FOR AVAILABLE RESEARCH PROTOCOL: no  ASSESSMENT: 72 y.o. Jehovah's Witness status post left inguinal orchiectomy 12/20/2017 for a diffuse large cell non-Hodgkin's lymphoma, germinal center B-cell subtype, CD20 positive, CD5 negative.  (1) staging studies: (a) PET scan 12/31/2017: Uptake only in the right testicle and panniculus, both of questionable significance (b) bone marrow biopsy 01/01/2018 shows no evidence of lymphoma (c) lumbar puncture 02/27/2018 benign  (2) international prognostic index: 2 = 80% predicted PFS4; stage I (or II if panniculus involved)  (3) cyclophosphamide, doxorubicin, vincristine, prednisone, rituximab started 01/16/2018, completed 03/03/2018 (a) received 3 cycles of CHOP-R, adjuvant radiation therapy to follow   PLAN: Kimberley is done with his chemotherapy.  He generally tolerated it remarkably well.  He feels very tired currently and attributes that to the lumbar puncture, but I think it is more likely to be the cumulative effect of the chemotherapy itself.  As far as the lumbar puncture side effects is concerned he did have some headaches initially, but those have resolved.  I have encouraged him to continue to hydrate himself aggressively.  I think the fatigue he feels may be in addition to the chemotherapy due to the fact that he did receive significant steroids and he may be minimally or mildly adrenally insufficient at this point.  That should resolve with time.  I have encouraged him to remain as active as possible.  He had a mild temperature yesterday.  None today.  He is not neutropenic.  He will call if he develops a frank fever.  He is now ready to start radiation.  I expect likely that we will begin in the middle of January.  I am going to be seeing him again late February by which time he should be getting done with treatment and we can start follow-up  I would anticipate a repeat PET scan sometime in May  He knows to  call for any other issue that may develop before the next visit.  Fernanda Twaddell, [name], MD  03/11/18 1:15 PM Medical Oncology and Hematology [facility] [address] [place], [place] [address] Tel. [phone]    Fax. [phone]    I, Jacqualyn Posey am acting as a Education administrator for [name], MD.   I, Lurline Del MD, have reviewed the above documentation for accuracy and completeness, and I agree with the above.

## 2018-03-11 ENCOUNTER — Inpatient Hospital Stay (HOSPITAL_BASED_OUTPATIENT_CLINIC_OR_DEPARTMENT_OTHER): Payer: Medicare Other | Admitting: Oncology

## 2018-03-11 ENCOUNTER — Telehealth: Payer: Self-pay | Admitting: Oncology

## 2018-03-11 ENCOUNTER — Inpatient Hospital Stay: Payer: Medicare Other

## 2018-03-11 VITALS — BP 114/57 | HR 102 | Temp 98.3°F | Resp 17 | Ht 74.0 in | Wt 181.0 lb

## 2018-03-11 DIAGNOSIS — C8335 Diffuse large B-cell lymphoma, lymph nodes of inguinal region and lower limb: Secondary | ICD-10-CM

## 2018-03-11 DIAGNOSIS — C8330 Diffuse large B-cell lymphoma, unspecified site: Secondary | ICD-10-CM

## 2018-03-11 DIAGNOSIS — R5383 Other fatigue: Secondary | ICD-10-CM | POA: Diagnosis not present

## 2018-03-11 DIAGNOSIS — C8580 Other specified types of non-Hodgkin lymphoma, unspecified site: Secondary | ICD-10-CM

## 2018-03-11 DIAGNOSIS — C8599 Non-Hodgkin lymphoma, unspecified, extranodal and solid organ sites: Secondary | ICD-10-CM

## 2018-03-11 DIAGNOSIS — Z87891 Personal history of nicotine dependence: Secondary | ICD-10-CM

## 2018-03-11 DIAGNOSIS — Z5111 Encounter for antineoplastic chemotherapy: Secondary | ICD-10-CM | POA: Diagnosis not present

## 2018-03-11 LAB — CMP (CANCER CENTER ONLY)
ALT: 14 U/L (ref 0–44)
AST: 15 U/L (ref 15–41)
Albumin: 3.5 g/dL (ref 3.5–5.0)
Alkaline Phosphatase: 83 U/L (ref 38–126)
Anion gap: 9 (ref 5–15)
BUN: 15 mg/dL (ref 8–23)
CO2: 26 mmol/L (ref 22–32)
Calcium: 8.7 mg/dL — ABNORMAL LOW (ref 8.9–10.3)
Chloride: 103 mmol/L (ref 98–111)
Creatinine: 0.74 mg/dL (ref 0.61–1.24)
GFR, Est AFR Am: >60 mL/min (ref >60)
GFR, Estimated: >60 mL/min (ref >60)
Glucose, Bld: 134 mg/dL — ABNORMAL HIGH (ref 70–99)
Potassium: 3.8 mmol/L (ref 3.5–5.1)
Sodium: 138 mmol/L (ref 135–145)
Total Bilirubin: 0.8 mg/dL (ref 0.3–1.2)
Total Protein: 6.7 g/dL (ref 6.5–8.1)

## 2018-03-11 LAB — CBC WITH DIFFERENTIAL (CANCER CENTER ONLY)
Abs Immature Granulocytes: 0.04 10*3/uL (ref 0.00–0.07)
Basophils Absolute: 0 10*3/uL (ref 0.0–0.1)
Basophils Relative: 1 %
Eosinophils Absolute: 0 10*3/uL (ref 0.0–0.5)
Eosinophils Relative: 2 %
HCT: 36.2 % — ABNORMAL LOW (ref 39.0–52.0)
Hemoglobin: 11.9 g/dL — ABNORMAL LOW (ref 13.0–17.0)
Immature Granulocytes: 2 %
LYMPHS PCT: 18 %
Lymphs Abs: 0.5 10*3/uL — ABNORMAL LOW (ref 0.7–4.0)
MCH: 31.4 pg (ref 26.0–34.0)
MCHC: 32.9 g/dL (ref 30.0–36.0)
MCV: 95.5 fL (ref 80.0–100.0)
Monocytes Absolute: 0.2 10*3/uL (ref 0.1–1.0)
Monocytes Relative: 8 %
Neutro Abs: 1.9 10*3/uL (ref 1.7–7.7)
Neutrophils Relative %: 69 %
Platelet Count: 101 10*3/uL — ABNORMAL LOW (ref 150–400)
RBC: 3.79 MIL/uL — ABNORMAL LOW (ref 4.22–5.81)
RDW: 16.7 % — ABNORMAL HIGH (ref 11.5–15.5)
WBC Count: 2.7 10*3/uL — ABNORMAL LOW (ref 4.0–10.5)
WBC Morphology: INCREASED
nRBC: 0 % (ref 0.0–0.2)

## 2018-03-11 LAB — URIC ACID: Uric Acid, Serum: 2.4 mg/dL — ABNORMAL LOW (ref 3.7–8.6)

## 2018-03-11 LAB — LACTATE DEHYDROGENASE: LDH: 163 U/L (ref 98–192)

## 2018-03-11 NOTE — Telephone Encounter (Signed)
Gave avs and calendar ° °

## 2018-03-13 ENCOUNTER — Ambulatory Visit (HOSPITAL_COMMUNITY)
Admission: RE | Admit: 2018-03-13 | Discharge: 2018-03-13 | Disposition: A | Discharge status: Home / Self Care | Payer: Medicare Other | Source: Ambulatory Visit | Attending: Oncology | Admitting: Oncology

## 2018-03-13 ENCOUNTER — Other Ambulatory Visit: Payer: Self-pay

## 2018-03-13 ENCOUNTER — Telehealth: Payer: Self-pay

## 2018-03-13 DIAGNOSIS — R05 Cough: Secondary | ICD-10-CM

## 2018-03-13 DIAGNOSIS — R509 Fever, unspecified: Secondary | ICD-10-CM

## 2018-03-13 DIAGNOSIS — R059 Cough, unspecified: Secondary | ICD-10-CM

## 2018-03-13 MED ORDER — PROMETHAZINE-CODEINE 6.25-10 MG/5ML PO SYRP: 5.0000 mL | ORAL_SOLUTION | Freq: Two times a day (BID) | ORAL | 0 refills | Status: DC

## 2018-03-13 MED ORDER — BENZONATATE 200 MG PO CAPS: 200.0000 mg | ORAL_CAPSULE | Freq: Three times a day (TID) | ORAL | 0 refills | Status: DC | PRN

## 2018-03-13 NOTE — Telephone Encounter (Signed)
Pt wife called and states that [facility] not [facility] phenergan codeine syrup. [facility] tried other [facility] and medication not available. Called [facility] and they are currently unavailable, but could order it for pick up tomorrow. Pt needs it for tonight since he has not had any sleep for a few days now. Per Dr.Magrinat, ok to substitute to Tesalon perles. Will send this to [facility] today.

## 2018-03-13 NOTE — Telephone Encounter (Signed)
Pt wife called to report that pt is having severe unproductive moist cough all day and all night. Pt also was running temp form 99.7-100.2 the last 2 days. Pt just finished c3 d8 rchop and saw Dr.Magrinat 12/31. Pt was instructed to start up on Cipro if pt fever continues. Pt now taking Cipro and OTC cough suppressants, without any relief and rest.  Discussed with Dr.Magrinat and obtained CXR pa/lat and phenergan-codeine cough syrup BID as needed. Pt wife instructed to take pt to [facility] hospital today, radiology dept and to pick up his prescription today. Pt wife thankful for the call and has no further needs at this time.

## 2018-03-14 ENCOUNTER — Other Ambulatory Visit: Payer: Self-pay | Admitting: Oncology

## 2018-03-14 ENCOUNTER — Telehealth: Payer: Self-pay

## 2018-03-14 NOTE — Telephone Encounter (Signed)
Pt called to see if CXR was ok. CXR was reviewed by MD and no concern of any infections or issues.Pt feeling much improved with taking tesalon perles. Pt states that he was able to sleep much better as well. Confirmed next appt for feb2020. Pt knows to call with any more questions/concerns.

## 2018-03-18 ENCOUNTER — Telehealth: Payer: Self-pay | Admitting: *Deleted

## 2018-03-26 ENCOUNTER — Ambulatory Visit
Admission: RE | Admit: 2018-03-26 | Discharge: 2018-03-26 | Disposition: A | Discharge status: Home / Self Care | Payer: Medicare Other | Source: Ambulatory Visit | Attending: Radiation Oncology | Admitting: Radiation Oncology

## 2018-03-26 DIAGNOSIS — C8599 Non-Hodgkin lymphoma, unspecified, extranodal and solid organ sites: Secondary | ICD-10-CM | POA: Insufficient documentation

## 2018-03-26 DIAGNOSIS — Z510 Encounter for antineoplastic radiation therapy: Secondary | ICD-10-CM | POA: Diagnosis not present

## 2018-03-26 DIAGNOSIS — C8580 Other specified types of non-Hodgkin lymphoma, unspecified site: Secondary | ICD-10-CM | POA: Insufficient documentation

## 2018-03-26 DIAGNOSIS — C8330 Diffuse large B-cell lymphoma, unspecified site: Secondary | ICD-10-CM

## 2018-03-26 NOTE — Progress Notes (Signed)
  Radiation Oncology         [phone] ________________________________  Name: Michael Allen MRN: [id number]  Date: 03/26/2018  DOB: [birth year]  SIMULATION AND TREATMENT PLANNING NOTE    ICD-10-CM   1. Lymphoma of testis ([place]) C85.99   2. Diffuse large cell non-Hodgkin's lymphoma ([facility]) C85.80     DIAGNOSIS:  Diffuse large cell non-Hodgkin's lymphoma of the testis  NARRATIVE:  The patient was brought to the [facility] [place] [facility].  Identity was confirmed.  All relevant records and images related to the planned course of therapy were reviewed.  The patient freely provided informed written consent to proceed with treatment after reviewing the details related to the planned course of therapy. The consent form was witnessed and verified by the simulation staff.  Then, the patient was set-up in a stable reproducible  supine position for radiation therapy.  CT images were obtained.  Surface markings were placed.  The CT images were loaded into the planning software.  Then the target and avoidance structures were contoured.  Treatment planning then occurred.  The radiation prescription was entered and confirmed.  Then, I designed and supervised the construction of a total of 4 medically necessary complex treatment devices.  I have requested : 3D Simulation  I have requested a DVH of the following structures: CTV, PTV, right testicle, rectum, bladder.  I have ordered:dose calc.  PLAN:  The patient will receive 32.4 Gy in 18 fractions directed at the right testicle and scrotum area.  -----------------------------------  Blair Promise, PhD, MD  This document serves as a record of services personally performed by [name], MD. It was created on his behalf by Mary-Margaret Loma Messing, a trained medical scribe. The creation of this record is based on the scribe's personal observations and the provider's statements to them. This document has been checked and approved by the attending  provider.

## 2018-04-01 DIAGNOSIS — Z510 Encounter for antineoplastic radiation therapy: Secondary | ICD-10-CM | POA: Diagnosis not present

## 2018-04-02 ENCOUNTER — Ambulatory Visit
Admission: RE | Admit: 2018-04-02 | Discharge: 2018-04-02 | Disposition: A | Discharge status: Home / Self Care | Payer: Medicare Other | Source: Ambulatory Visit | Attending: Radiation Oncology | Admitting: Radiation Oncology

## 2018-04-02 DIAGNOSIS — Z510 Encounter for antineoplastic radiation therapy: Secondary | ICD-10-CM | POA: Diagnosis not present

## 2018-04-03 ENCOUNTER — Ambulatory Visit
Admission: RE | Admit: 2018-04-03 | Discharge: 2018-04-03 | Disposition: A | Discharge status: Home / Self Care | Payer: Medicare Other | Source: Ambulatory Visit | Attending: Radiation Oncology | Admitting: Radiation Oncology

## 2018-04-03 DIAGNOSIS — Z510 Encounter for antineoplastic radiation therapy: Secondary | ICD-10-CM | POA: Diagnosis not present

## 2018-04-04 ENCOUNTER — Ambulatory Visit
Admission: RE | Admit: 2018-04-04 | Discharge: 2018-04-04 | Disposition: A | Discharge status: Home / Self Care | Payer: Medicare Other | Source: Ambulatory Visit | Attending: Radiation Oncology | Admitting: Radiation Oncology

## 2018-04-04 DIAGNOSIS — Z510 Encounter for antineoplastic radiation therapy: Secondary | ICD-10-CM | POA: Diagnosis not present

## 2018-04-07 ENCOUNTER — Ambulatory Visit
Admission: RE | Admit: 2018-04-07 | Discharge: 2018-04-07 | Disposition: A | Discharge status: Home / Self Care | Payer: Medicare Other | Source: Ambulatory Visit | Attending: Radiation Oncology | Admitting: Radiation Oncology

## 2018-04-07 DIAGNOSIS — Z510 Encounter for antineoplastic radiation therapy: Secondary | ICD-10-CM | POA: Diagnosis not present

## 2018-04-08 ENCOUNTER — Ambulatory Visit
Admission: RE | Admit: 2018-04-08 | Discharge: 2018-04-08 | Disposition: A | Discharge status: Home / Self Care | Payer: Medicare Other | Source: Ambulatory Visit | Attending: Radiation Oncology | Admitting: Radiation Oncology

## 2018-04-08 DIAGNOSIS — Z510 Encounter for antineoplastic radiation therapy: Secondary | ICD-10-CM | POA: Diagnosis not present

## 2018-04-09 ENCOUNTER — Ambulatory Visit
Admission: RE | Admit: 2018-04-09 | Discharge: 2018-04-09 | Disposition: A | Discharge status: Home / Self Care | Payer: Medicare Other | Source: Ambulatory Visit | Attending: Radiation Oncology | Admitting: Radiation Oncology

## 2018-04-09 DIAGNOSIS — Z510 Encounter for antineoplastic radiation therapy: Secondary | ICD-10-CM | POA: Diagnosis not present

## 2018-04-10 ENCOUNTER — Ambulatory Visit
Admission: RE | Admit: 2018-04-10 | Discharge: 2018-04-10 | Disposition: A | Discharge status: Home / Self Care | Payer: Medicare Other | Source: Ambulatory Visit | Attending: Radiation Oncology | Admitting: Radiation Oncology

## 2018-04-10 DIAGNOSIS — Z510 Encounter for antineoplastic radiation therapy: Secondary | ICD-10-CM | POA: Diagnosis not present

## 2018-04-11 ENCOUNTER — Ambulatory Visit
Admission: RE | Admit: 2018-04-11 | Discharge: 2018-04-11 | Disposition: A | Discharge status: Home / Self Care | Payer: Medicare Other | Source: Ambulatory Visit | Attending: Radiation Oncology | Admitting: Radiation Oncology

## 2018-04-11 DIAGNOSIS — Z510 Encounter for antineoplastic radiation therapy: Secondary | ICD-10-CM | POA: Diagnosis not present

## 2018-04-14 ENCOUNTER — Ambulatory Visit
Admission: RE | Admit: 2018-04-14 | Discharge: 2018-04-14 | Disposition: A | Discharge status: Home / Self Care | Payer: Medicare Other | Source: Ambulatory Visit | Attending: Radiation Oncology | Admitting: Radiation Oncology

## 2018-04-14 DIAGNOSIS — Z510 Encounter for antineoplastic radiation therapy: Secondary | ICD-10-CM | POA: Insufficient documentation

## 2018-04-14 DIAGNOSIS — C8580 Other specified types of non-Hodgkin lymphoma, unspecified site: Secondary | ICD-10-CM | POA: Insufficient documentation

## 2018-04-14 DIAGNOSIS — C8599 Non-Hodgkin lymphoma, unspecified, extranodal and solid organ sites: Secondary | ICD-10-CM | POA: Diagnosis not present

## 2018-04-15 ENCOUNTER — Ambulatory Visit
Admission: RE | Admit: 2018-04-15 | Discharge: 2018-04-15 | Disposition: A | Discharge status: Home / Self Care | Payer: Medicare Other | Source: Ambulatory Visit | Attending: Radiation Oncology | Admitting: Radiation Oncology

## 2018-04-15 DIAGNOSIS — C8599 Non-Hodgkin lymphoma, unspecified, extranodal and solid organ sites: Secondary | ICD-10-CM

## 2018-04-15 DIAGNOSIS — Z510 Encounter for antineoplastic radiation therapy: Secondary | ICD-10-CM | POA: Diagnosis not present

## 2018-04-15 MED ORDER — RADIAPLEXRX EX GEL
Freq: Once | CUTANEOUS | Status: AC
  Administered 2018-04-15: 12:00:00 via TOPICAL

## 2018-04-16 ENCOUNTER — Ambulatory Visit
Admission: RE | Admit: 2018-04-16 | Discharge: 2018-04-16 | Disposition: A | Discharge status: Home / Self Care | Payer: Medicare Other | Source: Ambulatory Visit | Attending: Radiation Oncology | Admitting: Radiation Oncology

## 2018-04-16 DIAGNOSIS — Z510 Encounter for antineoplastic radiation therapy: Secondary | ICD-10-CM | POA: Diagnosis not present

## 2018-04-17 ENCOUNTER — Ambulatory Visit
Admission: RE | Admit: 2018-04-17 | Discharge: 2018-04-17 | Disposition: A | Discharge status: Home / Self Care | Payer: Medicare Other | Source: Ambulatory Visit | Attending: Radiation Oncology | Admitting: Radiation Oncology

## 2018-04-17 DIAGNOSIS — Z510 Encounter for antineoplastic radiation therapy: Secondary | ICD-10-CM | POA: Diagnosis not present

## 2018-04-18 ENCOUNTER — Ambulatory Visit
Admission: RE | Admit: 2018-04-18 | Discharge: 2018-04-18 | Disposition: A | Discharge status: Home / Self Care | Payer: Medicare Other | Source: Ambulatory Visit | Attending: Radiation Oncology | Admitting: Radiation Oncology

## 2018-04-18 DIAGNOSIS — Z510 Encounter for antineoplastic radiation therapy: Secondary | ICD-10-CM | POA: Diagnosis not present

## 2018-04-21 ENCOUNTER — Ambulatory Visit
Admission: RE | Admit: 2018-04-21 | Discharge: 2018-04-21 | Disposition: A | Discharge status: Home / Self Care | Payer: Medicare Other | Source: Ambulatory Visit | Attending: Radiation Oncology | Admitting: Radiation Oncology

## 2018-04-21 DIAGNOSIS — Z510 Encounter for antineoplastic radiation therapy: Secondary | ICD-10-CM | POA: Diagnosis not present

## 2018-04-22 ENCOUNTER — Ambulatory Visit
Admission: RE | Admit: 2018-04-22 | Discharge: 2018-04-22 | Disposition: A | Discharge status: Home / Self Care | Payer: Medicare Other | Source: Ambulatory Visit | Attending: Radiation Oncology | Admitting: Radiation Oncology

## 2018-04-22 DIAGNOSIS — Z510 Encounter for antineoplastic radiation therapy: Secondary | ICD-10-CM | POA: Diagnosis not present

## 2018-04-23 ENCOUNTER — Ambulatory Visit
Admission: RE | Admit: 2018-04-23 | Discharge: 2018-04-23 | Disposition: A | Discharge status: Home / Self Care | Payer: Medicare Other | Source: Ambulatory Visit | Attending: Radiation Oncology | Admitting: Radiation Oncology

## 2018-04-23 DIAGNOSIS — Z510 Encounter for antineoplastic radiation therapy: Secondary | ICD-10-CM | POA: Diagnosis not present

## 2018-04-24 ENCOUNTER — Ambulatory Visit
Admission: RE | Admit: 2018-04-24 | Discharge: 2018-04-24 | Disposition: A | Discharge status: Home / Self Care | Payer: Medicare Other | Source: Ambulatory Visit | Attending: Radiation Oncology | Admitting: Radiation Oncology

## 2018-04-24 DIAGNOSIS — Z510 Encounter for antineoplastic radiation therapy: Secondary | ICD-10-CM | POA: Diagnosis not present

## 2018-04-24 NOTE — Progress Notes (Signed)
[name]  [phone]) [phone] [phone]) [phone]   ID: Michael Allen DOB: Sep 11, 1945  MR#: [id number]  FXT#:[id number]  Patient Care Team: Seward Carol, MD as PCP - General (Internal Medicine) [name], MD as Consulting Physician (Urology) [name], [name], MD as Consulting Physician (Oncology) [name], MD as Consulting Physician (Ophthalmology) [name], MD as Consulting Physician (Radiation Oncology) [name], MD OTHER MD:   CHIEF COMPLAINT: Diffuse large cell non-Hodgkin's lymphoma, germinal center B-cell subtype  CURRENT TREATMENT: observaton   HISTORY OF CURRENT ILLNESS: From the original intake note 12/27/2017:  "The patient was referred to Dr. [name] for renal stones.  Incidentally he was noted to have left scrotal swelling.  This had been present several months and was initially felt to be a hydrocele.  Scrotal ultrasound was performed 12/06/2017.  This found no spermatocele but a solid mass in the left testis upper and lower pole.  Accordingly on 12/20/2017 the patient underwent left inguinal orchiectomy.  The pathology from this procedure (SZ [phone]) showed a diffuse large B cell non-Hodgkin's lymphoma, CD20 positive, CD10 weakly positive, no coexpression of CD5, and most consistent with a germinal center B-cell subtype.  I was called by Dr. [name] earlier today and the patient was brought in for further evaluation."  His subsequent history is as detailed below.   INTERVAL HISTORY: Bland returns today for follow-up and treatment of his large cell B-cell non-Hodgkin's lymphoma. He is accompanied by his wife.  He completed radiation on 04/25/2018. Last week, he noticed some burning and blistering of the skin over his pelvic area. He has prescribed radio Plex cream to treat.   Since his last visit here, he underwent a chest xray on 03/13/2018 showing old left rib fracture deformities. Scarring laterally at  the left lung base. Lungs otherwise clear. Heart size normal. No effusion. No pneumothorax. No focal infiltrate or overt edema.   REVIEW OF SYSTEMS:  Ketih lost all of his hair from chemo. Now, he can go three or four days without shaving. The hair on his head has began to grow back. He has constipation, with some irritation at the rectum. He has noticed that his urine stream is weak if he wakes up late at night; his urine stream during the day is normal, however. Back during chemotherapy, he developed a strong cough and he ran a mild fever; his wife was tested positive for the flu. The patient denies unusual headaches, visual changes, nausea, vomiting, or dizziness. There has been no unusual phlegm production, or pleurisy. This been no change in bowel habits. The patient denies unexplained fatigue or unexplained weight loss, or bleeding. A detailed review of systems was otherwise noncontributory.    PAST MEDICAL HISTORY: Past Medical History:  Diagnosis Date  . Chalazion   . Dental crowns present   . GERD (gastroesophageal reflux disease)   . History of kidney stones 10/25/2017   right nonobstructing stone noted on CT ABD/Pelvis  . Hydronephrosis 10/25/2017   Mild to moderate right due to 55m stone right distal, noted on CT abd/pelvis  . Hydroureter, right 10/25/2017   464mstone right distal, noted on CT abd/pelvis  . Impaired fasting glucose   . Migraine    optical  . Plantar fasciitis 2003  . PONV (postoperative nausea and vomiting)    light  . Pure hyperglyceridemia   . Rectal bleeding 2002  . Recurrent spontaneous pneumothorax    due to blebsx2  . Skin cancer of scalp 2011  .  Transfusion of blood product refused for religious reason   . Ureteral stone 10/25/2017   43m stone right distal, noted on CT abd/pelvis  . Wears glasses     PAST SURGICAL HISTORY: Past Surgical History:  Procedure Laterality Date  . CHALAZION EXCISION Right    cyst removal , right eye  . COLONOSCOPY     . HTexhoma 2002  . INGUINAL HERNIA REPAIR Bilateral 2005   wire mesh  . LUNG SURGERY     x2  . ORCHIECTOMY Left 12/20/2017   Procedure: ORCHIECTOMY;  Surgeon: DFranchot Gallo MD;  [facility]  Service: Urology;  Laterality: Left;  . TONSILLECTOMY      FAMILY HISTORY Family History  Problem Relation Age of Onset  . Dementia Mother        progressive  . Breast cancer Mother   . Heart Problems Father        pacemaker  . CAD Father   . Heart disease Father    The patient's father died from heart and lung problems in his 869s  The patient's mother had breast cancer in her 574s died in her 944sfrom Alzheimer's disease.  The patient had no brothers, 2 sisters.  One sister has what sounds like lung cancer metastatic to the brain, in her 719s  One maternal cousin was diagnosed with Ms. to have been breast cancer metastatic to the lung in her 519s   SOCIAL HISTORY:  The patient retired from [employer] than 20 years ago.  He is very active in the [employer].   His wife NBonnita Nasutiis an education major but mostly worked as a housewife.  Daughter BEustaquio Maizeis a "mom", and sign language interpreted as well as very active in the natural path movement.  [place]  [place] [employer]  The patient has 2 grandchildren   ADVANCED DIRECTIVES: Please note patient would refuse a blood transfusion even for lifesaving reasons (discussed 12/27/2017)   HEALTH MAINTENANCE: Social History   Tobacco Use  . 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 Smokeless tobacco: Never Used  . Tobacco comment: age 73 Substance Use Topics  . Alcohol use: Yes    Comment: occ beer  . Drug use: Never     Colonoscopy: 2015  PSA:  Bone density:   Allergies  Allergen Reactions  . Blood-Group Specific Substance   . Oxycodone   . Penicillins   . Shellfish Allergy Hives    Shell fish and shrimp    Current  Outpatient Medications  Medication Sig Dispense Refill  . benzonatate (TESSALON) 200 MG capsule Take 1 capsule (200 mg total) by mouth 3 (three) times daily as needed for cough. 30 capsule 0  . metroNIDAZOLE (METROGEL) 1 % gel Apply topically daily. 45 g 0  . valACYclovir (VALTREX) 500 MG tablet Take 1 tablet (500 mg total) by mouth 2 (two) times daily. 90 tablet 3   No current facility-administered medications for this visit.     OBJECTIVE: Middle-aged white man in no acute distress   Vitals:   04/29/18 1510  BP: 127/66  Pulse: 87  Resp: 18  Temp: 97.9 F (36.6 C)  SpO2: 100%     Body mass index is 23.32 kg/m.   Wt Readings from Last 3 Encounters:  04/29/18 181 lb 9.6 oz (82.4 kg)  03/11/18 181 lb (82.1 kg)  03/03/18 181 lb 8 oz (  82.3 kg)      ECOG FS:1 - Symptomatic but completely ambulatory  Sclerae unicteric, pupils round and equal, no eyelashes, sparse eyebrows Oropharynx clear and moist No cervical or supraclavicular adenopathy, no axillary or inguinal adenopathy Lungs no rales or rhonchi Heart regular rate and rhythm Abd soft, nontender, positive bowel sounds, no masses palpated Pelvis: Scrotal erythema and mild dry desquamation, right testicle not atrophic MSK no focal spinal tenderness Neuro: nonfocal, well oriented, positive affect  LAB RESULTS:  CMP     Component Value Date/Time   NA 141 04/29/2018 1447   K 3.8 04/29/2018 1447   CL 106 04/29/2018 1447   CO2 29 04/29/2018 1447   GLUCOSE 89 04/29/2018 1447   BUN 14 04/29/2018 1447   CREATININE 0.85 04/29/2018 1447   CREATININE 0.74 03/11/2018 1205   CALCIUM 8.7 (L) 04/29/2018 1447   PROT 6.9 04/29/2018 1447   ALBUMIN 3.8 04/29/2018 1447   AST 23 04/29/2018 1447   AST 15 03/11/2018 1205   ALT 13 04/29/2018 1447   ALT 14 03/11/2018 1205   ALKPHOS 78 04/29/2018 1447   BILITOT 0.7 04/29/2018 1447   BILITOT 0.8 03/11/2018 1205   GFRNONAA >60 04/29/2018 1447   GFRNONAA >60 03/11/2018 1205   GFRAA  >60 04/29/2018 1447   GFRAA >60 03/11/2018 1205    No results found for: TOTALPROTELP, ALBUMINELP, A1GS, A2GS, BETS, BETA2SER, GAMS, MSPIKE, SPEI  No results found for: Nils Pyle, [facility]  Lab Results  Component Value Date   WBC 6.0 04/29/2018   NEUTROABS 4.1 04/29/2018   HGB 13.2 04/29/2018   HCT 40.1 04/29/2018   MCV 97.1 04/29/2018   PLT 163 04/29/2018      Chemistry      Component Value Date/Time   NA 141 04/29/2018 1447   K 3.8 04/29/2018 1447   CL 106 04/29/2018 1447   CO2 29 04/29/2018 1447   BUN 14 04/29/2018 1447   CREATININE 0.85 04/29/2018 1447   CREATININE 0.74 03/11/2018 1205      Component Value Date/Time   CALCIUM 8.7 (L) 04/29/2018 1447   ALKPHOS 78 04/29/2018 1447   AST 23 04/29/2018 1447   AST 15 03/11/2018 1205   ALT 13 04/29/2018 1447   ALT 14 03/11/2018 1205   BILITOT 0.7 04/29/2018 1447   BILITOT 0.8 03/11/2018 1205       No results found for: LABCA2  No components found for: [id number]  No results for input(s): INR in the last 168 hours.  No results found for: LABCA2  No results found for: MBW466  No results found for: ZLD357  No results found for: SVX793  No results found for: CA2729  No components found for: HGQUANT  No results found for: CEA1 / No results found for: CEA1   No results found for: AFPTUMOR  No results found for: CHROMOGRNA  No results found for: PSA1  Appointment on 04/29/2018  Component Date Value Ref Range Status  . Sodium 04/29/2018 141  135 - 145 mmol/L Final  . Potassium 04/29/2018 3.8  3.5 - 5.1 mmol/L Final  . Chloride 04/29/2018 106  98 - 111 mmol/L Final  . CO2 04/29/2018 29  22 - 32 mmol/L Final  . Glucose, Bld 04/29/2018 89  70 - 99 mg/dL Final  . BUN 04/29/2018 14  8 - 23 mg/dL Final  . Creatinine, Ser 04/29/2018 0.85  0.61 - 1.24 mg/dL Final  . Calcium 04/29/2018 8.7* 8.9 - 10.3 mg/dL Final  . Total Protein 04/29/2018  6.9  6.5 - 8.1 g/dL Final  . Albumin 04/29/2018 3.8   3.5 - 5.0 g/dL Final  . AST 04/29/2018 23  15 - 41 U/L Final  . ALT 04/29/2018 13  0 - 44 U/L Final  . Alkaline Phosphatase 04/29/2018 78  38 - 126 U/L Final  . Total Bilirubin 04/29/2018 0.7  0.3 - 1.2 mg/dL Final  . GFR calc non Af Amer 04/29/2018 >60  >60 mL/min Final  . GFR calc Af Amer 04/29/2018 >60  >60 mL/min Final  . Anion gap 04/29/2018 6  5 - 15 Final   Performed at [facility], [place] [address], [place], [place] [address]  . WBC 04/29/2018 6.0  4.0 - 10.5 K/uL Final  . RBC 04/29/2018 4.13* 4.22 - 5.81 MIL/uL Final  . Hemoglobin 04/29/2018 13.2  13.0 - 17.0 g/dL Final  . HCT 04/29/2018 40.1  39.0 - 52.0 % Final  . MCV 04/29/2018 97.1  80.0 - 100.0 fL Final  . MCH 04/29/2018 32.0  26.0 - 34.0 pg Final  . MCHC 04/29/2018 32.9  30.0 - 36.0 g/dL Final  . RDW 04/29/2018 14.2  11.5 - 15.5 % Final  . Platelets 04/29/2018 163  150 - 400 K/uL Final  . nRBC 04/29/2018 0.0  0.0 - 0.2 % Final  . Neutrophils Relative % 04/29/2018 69  % Final  . Neutro Abs 04/29/2018 4.1  1.7 - 7.7 K/uL Final  . Lymphocytes Relative 04/29/2018 17  % Final  . Lymphs Abs 04/29/2018 1.0  0.7 - 4.0 K/uL Final  . Monocytes Relative 04/29/2018 11  % Final  . Monocytes Absolute 04/29/2018 0.6  0.1 - 1.0 K/uL Final  . Eosinophils Relative 04/29/2018 3  % Final  . Eosinophils Absolute 04/29/2018 0.2  0.0 - 0.5 K/uL Final  . Basophils Relative 04/29/2018 0  % Final  . Basophils Absolute 04/29/2018 0.0  0.0 - 0.1 K/uL Final  . Immature Granulocytes 04/29/2018 0  % Final  . Abs Immature Granulocytes 04/29/2018 0.01  0.00 - 0.07 K/uL Final   Performed at [facility], [place] [address] [place], [place] [address]    (this displays the last labs from the last 3 days)  No results found for: TOTALPROTELP, ALBUMINELP, A1GS, A2GS, BETS, BETA2SER, GAMS, MSPIKE, SPEI (this displays SPEP labs)  No results found for: KPAFRELGTCHN, LAMBDASER, KAPLAMBRATIO (kappa/lambda  light chains)  No results found for: HGBA, HGBA2QUANT, HGBFQUANT, HGBSQUAN (Hemoglobinopathy evaluation)   Lab Results  Component Value Date   LDH 163 03/11/2018    No results found for: IRON, TIBC, IRONPCTSAT (Iron and TIBC)  No results found for: FERRITIN  Urinalysis No results found for: COLORURINE, APPEARANCEUR, LABSPEC, PHURINE, GLUCOSEU, HGBUR, BILIRUBINUR, KETONESUR, PROTEINUR, UROBILINOGEN, NITRITE, LEUKOCYTESUR   STUDIES: No results found.   ELIGIBLE FOR AVAILABLE RESEARCH PROTOCOL: no  ASSESSMENT: 73 y.o. Jehovah's Witness status post left inguinal orchiectomy 12/20/2017 for a diffuse large cell non-Hodgkin's lymphoma, germinal center B-cell subtype, CD20 positive, CD5 negative.  (1) staging studies: (a) PET scan 12/31/2017: Uptake only in the right testicle and panniculus, both of questionable significance (b) bone marrow biopsy 01/01/2018 shows no evidence of lymphoma (c) lumbar puncture 02/27/2018 benign  (2) international prognostic index: 2 = 80% predicted PFS4; stage I (or II if panniculus involved)  (3) cyclophosphamide, doxorubicin, vincristine, prednisone, rituximab started 01/16/2018, completed 03/03/2018 (a) received 3 cycles of CHOP-R  (4) adjuvant radiation completed 04/25/2018  PLAN: Bladen has completed active treatment for his testicular  lymphoma.  Overall he tolerated treatment remarkably well.  He has not got his hair back, and he still has an energy deficit.  He is also having some constipation issues and we discussed how dangerous it is for someone particularly whose immune system is not in perfect shape to have very hard bowel movements.  He will start stool softeners 2 tablets twice daily and continue until that problem is resolved.  I reassured him that the skin of his scrotum will heal rather quickly.  There may be some hyperpigmentation for some time.  We are going to restage him with a PET scan in June.  Prior to that he will have  restaging labs which will include a beta-2 microglobulin, LDH, and testosterone level  He knows to call for any other issue that may develop before the next visit.   Micca Matura, [name], MD  04/29/18 4:01 PM Medical Oncology and Hematology [facility] [address] [place], [place] [address] Tel. [phone]    Fax. [phone]   I, Jacqualyn Posey am acting as a Education administrator for [name], MD.   I, Lurline Del MD, have reviewed the above documentation for accuracy and completeness, and I agree with the above.

## 2018-04-25 ENCOUNTER — Ambulatory Visit
Admission: RE | Admit: 2018-04-25 | Discharge: 2018-04-25 | Disposition: A | Discharge status: Home / Self Care | Payer: Medicare Other | Source: Ambulatory Visit | Attending: Radiation Oncology | Admitting: Radiation Oncology

## 2018-04-25 DIAGNOSIS — Z510 Encounter for antineoplastic radiation therapy: Secondary | ICD-10-CM | POA: Diagnosis not present

## 2018-04-29 ENCOUNTER — Inpatient Hospital Stay (HOSPITAL_BASED_OUTPATIENT_CLINIC_OR_DEPARTMENT_OTHER): Payer: Medicare Other | Admitting: Oncology

## 2018-04-29 ENCOUNTER — Telehealth: Payer: Self-pay | Admitting: Oncology

## 2018-04-29 ENCOUNTER — Inpatient Hospital Stay: Payer: Medicare Other | Attending: Oncology

## 2018-04-29 VITALS — BP 127/66 | HR 87 | Temp 97.9°F | Resp 18 | Ht 74.0 in | Wt 181.6 lb

## 2018-04-29 DIAGNOSIS — C8599 Non-Hodgkin lymphoma, unspecified, extranodal and solid organ sites: Secondary | ICD-10-CM

## 2018-04-29 DIAGNOSIS — Z923 Personal history of irradiation: Secondary | ICD-10-CM | POA: Diagnosis not present

## 2018-04-29 DIAGNOSIS — C8580 Other specified types of non-Hodgkin lymphoma, unspecified site: Secondary | ICD-10-CM

## 2018-04-29 DIAGNOSIS — Z9079 Acquired absence of other genital organ(s): Secondary | ICD-10-CM | POA: Insufficient documentation

## 2018-04-29 DIAGNOSIS — Z79899 Other long term (current) drug therapy: Secondary | ICD-10-CM | POA: Diagnosis not present

## 2018-04-29 DIAGNOSIS — L658 Other specified nonscarring hair loss: Secondary | ICD-10-CM

## 2018-04-29 DIAGNOSIS — C8330 Diffuse large B-cell lymphoma, unspecified site: Secondary | ICD-10-CM

## 2018-04-29 DIAGNOSIS — C8335 Diffuse large B-cell lymphoma, lymph nodes of inguinal region and lower limb: Secondary | ICD-10-CM

## 2018-04-29 DIAGNOSIS — Z87891 Personal history of nicotine dependence: Secondary | ICD-10-CM

## 2018-04-29 DIAGNOSIS — N200 Calculus of kidney: Secondary | ICD-10-CM

## 2018-04-29 DIAGNOSIS — C8585 Other specified types of non-Hodgkin lymphoma, lymph nodes of inguinal region and lower limb: Secondary | ICD-10-CM | POA: Insufficient documentation

## 2018-04-29 DIAGNOSIS — K5730 Diverticulosis of large intestine without perforation or abscess without bleeding: Secondary | ICD-10-CM

## 2018-04-29 DIAGNOSIS — K5900 Constipation, unspecified: Secondary | ICD-10-CM

## 2018-04-29 DIAGNOSIS — N1330 Unspecified hydronephrosis: Secondary | ICD-10-CM

## 2018-04-29 DIAGNOSIS — I700 Atherosclerosis of aorta: Secondary | ICD-10-CM

## 2018-04-29 DIAGNOSIS — Z803 Family history of malignant neoplasm of breast: Secondary | ICD-10-CM | POA: Insufficient documentation

## 2018-04-29 DIAGNOSIS — J8410 Pulmonary fibrosis, unspecified: Secondary | ICD-10-CM

## 2018-04-29 LAB — CBC WITH DIFFERENTIAL/PLATELET
Abs Immature Granulocytes: 0.01 10*3/uL (ref 0.00–0.07)
Basophils Absolute: 0 10*3/uL (ref 0.0–0.1)
Basophils Relative: 0 %
Eosinophils Absolute: 0.2 10*3/uL (ref 0.0–0.5)
Eosinophils Relative: 3 %
HCT: 40.1 % (ref 39.0–52.0)
Hemoglobin: 13.2 g/dL (ref 13.0–17.0)
IMMATURE GRANULOCYTES: 0 %
Lymphocytes Relative: 17 %
Lymphs Abs: 1 10*3/uL (ref 0.7–4.0)
MCH: 32 pg (ref 26.0–34.0)
MCHC: 32.9 g/dL (ref 30.0–36.0)
MCV: 97.1 fL (ref 80.0–100.0)
Monocytes Absolute: 0.6 10*3/uL (ref 0.1–1.0)
Monocytes Relative: 11 %
Neutro Abs: 4.1 10*3/uL (ref 1.7–7.7)
Neutrophils Relative %: 69 %
Platelets: 163 10*3/uL (ref 150–400)
RBC: 4.13 MIL/uL — ABNORMAL LOW (ref 4.22–5.81)
RDW: 14.2 % (ref 11.5–15.5)
WBC: 6 10*3/uL (ref 4.0–10.5)
nRBC: 0 % (ref 0.0–0.2)

## 2018-04-29 LAB — COMPREHENSIVE METABOLIC PANEL
ALBUMIN: 3.8 g/dL (ref 3.5–5.0)
ALK PHOS: 78 U/L (ref 38–126)
ALT: 13 U/L (ref 0–44)
AST: 23 U/L (ref 15–41)
Anion gap: 6 (ref 5–15)
BUN: 14 mg/dL (ref 8–23)
CO2: 29 mmol/L (ref 22–32)
Calcium: 8.7 mg/dL — ABNORMAL LOW (ref 8.9–10.3)
Chloride: 106 mmol/L (ref 98–111)
Creatinine, Ser: 0.85 mg/dL (ref 0.61–1.24)
GFR calc Af Amer: >60 mL/min (ref >60)
GFR calc non Af Amer: >60 mL/min (ref >60)
GLUCOSE: 89 mg/dL (ref 70–99)
Potassium: 3.8 mmol/L (ref 3.5–5.1)
SODIUM: 141 mmol/L (ref 135–145)
TOTAL PROTEIN: 6.9 g/dL (ref 6.5–8.1)
Total Bilirubin: 0.7 mg/dL (ref 0.3–1.2)

## 2018-04-29 LAB — LACTATE DEHYDROGENASE: LDH: 165 U/L (ref 98–192)

## 2018-04-29 NOTE — Telephone Encounter (Signed)
Gave avs and calendar ° °

## 2018-04-30 ENCOUNTER — Encounter: Payer: Self-pay | Admitting: Radiation Oncology

## 2018-04-30 LAB — BETA 2 MICROGLOBULIN, SERUM: Beta-2 Microglobulin: 1.3 mg/L (ref 0.6–2.4)

## 2018-04-30 NOTE — Progress Notes (Signed)
  Radiation Oncology         [phone] ________________________________  Name: Michael Allen MRN: [id number]  Date: 04/30/2018  DOB: 01-06-1946  End of Treatment Note  Diagnosis:   Diffuse large cell non-Hodgkin's lymphoma, germinal center B-cell subtype, stage I,  presenting in the left testicle     Indication for treatment:  Curative       Radiation treatment dates:   04/02/18-04/25/18  Site/dose:  Right Testicle/Scrotum, 18 fractions of 1.8 Gy for a total of 32.4 Gy  Beams/energy:   3D Photon; 6X  Narrative: The patient tolerated radiation treatment relatively well.     Pt denied hematuria and pain throughout treatments. Towards the end of treatment, pt reported complete bladder emptying and nocturia 1x a night. He developed some erythema and swelling in the scrotal area but no moist desquamation at the completion of treatment.  Plan: The patient has completed radiation treatment. The patient will return to radiation oncology clinic for routine followup in one month. I advised them to call or return sooner if they have any questions or concerns related to their recovery or treatment.  -----------------------------------  Blair Promise, PhD, MD  This document serves as a record of services personally performed by [name], MD. It was created on his behalf by Mary-Margaret Loma Messing, a trained medical scribe. The creation of this record is based on the scribe's personal observations and the provider's statements to them. This document has been checked and approved by the attending provider.

## 2018-05-26 ENCOUNTER — Ambulatory Visit: Payer: Medicare Other | Admitting: Radiation Oncology

## 2018-07-28 ENCOUNTER — Other Ambulatory Visit: Payer: Self-pay | Admitting: Oncology

## 2018-07-28 DIAGNOSIS — C8599 Non-Hodgkin lymphoma, unspecified, extranodal and solid organ sites: Secondary | ICD-10-CM

## 2018-07-28 DIAGNOSIS — C8330 Diffuse large B-cell lymphoma, unspecified site: Secondary | ICD-10-CM

## 2018-07-28 NOTE — Progress Notes (Signed)
Michael Allen and his wife expressed a concern about having labs and is scheduling PET scan in June pandemic.  They would like to postpone it.  We discussed that and we are going to move the PET scan labs to mid August.  I am adding a brain MRI just in case and for future reference and then they will see me a week after the studies have been obtained

## 2018-07-30 ENCOUNTER — Telehealth: Payer: Self-pay | Admitting: Oncology

## 2018-07-30 NOTE — Telephone Encounter (Signed)
Rescheduled appt per sch msg. Called and spoke with patients wife. Confirmed dates and times

## 2018-08-20 ENCOUNTER — Other Ambulatory Visit: Payer: Medicare Other

## 2018-08-27 ENCOUNTER — Ambulatory Visit: Payer: Medicare Other | Admitting: Oncology

## 2018-10-20 ENCOUNTER — Other Ambulatory Visit: Payer: Medicare Other

## 2018-10-20 ENCOUNTER — Other Ambulatory Visit: Payer: Self-pay

## 2018-10-20 ENCOUNTER — Inpatient Hospital Stay: Payer: Medicare Other | Attending: Oncology

## 2018-10-20 DIAGNOSIS — Z803 Family history of malignant neoplasm of breast: Secondary | ICD-10-CM | POA: Insufficient documentation

## 2018-10-20 DIAGNOSIS — K5730 Diverticulosis of large intestine without perforation or abscess without bleeding: Secondary | ICD-10-CM

## 2018-10-20 DIAGNOSIS — Z85828 Personal history of other malignant neoplasm of skin: Secondary | ICD-10-CM | POA: Insufficient documentation

## 2018-10-20 DIAGNOSIS — Z87891 Personal history of nicotine dependence: Secondary | ICD-10-CM | POA: Diagnosis not present

## 2018-10-20 DIAGNOSIS — E781 Pure hyperglyceridemia: Secondary | ICD-10-CM | POA: Diagnosis not present

## 2018-10-20 DIAGNOSIS — J8410 Pulmonary fibrosis, unspecified: Secondary | ICD-10-CM

## 2018-10-20 DIAGNOSIS — N200 Calculus of kidney: Secondary | ICD-10-CM

## 2018-10-20 DIAGNOSIS — Z9221 Personal history of antineoplastic chemotherapy: Secondary | ICD-10-CM | POA: Diagnosis not present

## 2018-10-20 DIAGNOSIS — I700 Atherosclerosis of aorta: Secondary | ICD-10-CM

## 2018-10-20 DIAGNOSIS — Z923 Personal history of irradiation: Secondary | ICD-10-CM | POA: Diagnosis not present

## 2018-10-20 DIAGNOSIS — Z79899 Other long term (current) drug therapy: Secondary | ICD-10-CM | POA: Diagnosis not present

## 2018-10-20 DIAGNOSIS — N1330 Unspecified hydronephrosis: Secondary | ICD-10-CM

## 2018-10-20 DIAGNOSIS — C8599 Non-Hodgkin lymphoma, unspecified, extranodal and solid organ sites: Secondary | ICD-10-CM

## 2018-10-20 DIAGNOSIS — C8330 Diffuse large B-cell lymphoma, unspecified site: Secondary | ICD-10-CM | POA: Insufficient documentation

## 2018-10-20 LAB — CBC WITH DIFFERENTIAL/PLATELET
Abs Immature Granulocytes: 0.01 10*3/uL (ref 0.00–0.07)
Basophils Absolute: 0 10*3/uL (ref 0.0–0.1)
Basophils Relative: 1 %
Eosinophils Absolute: 0.1 10*3/uL (ref 0.0–0.5)
Eosinophils Relative: 1 %
HCT: 43.3 % (ref 39.0–52.0)
Hemoglobin: 14.6 g/dL (ref 13.0–17.0)
Immature Granulocytes: 0 %
Lymphocytes Relative: 28 %
Lymphs Abs: 1.6 10*3/uL (ref 0.7–4.0)
MCH: 31.1 pg (ref 26.0–34.0)
MCHC: 33.7 g/dL (ref 30.0–36.0)
MCV: 92.3 fL (ref 80.0–100.0)
Monocytes Absolute: 0.6 10*3/uL (ref 0.1–1.0)
Monocytes Relative: 10 %
Neutro Abs: 3.5 10*3/uL (ref 1.7–7.7)
Neutrophils Relative %: 60 %
Platelets: 191 10*3/uL (ref 150–400)
RBC: 4.69 MIL/uL (ref 4.22–5.81)
RDW: 14 % (ref 11.5–15.5)
WBC: 5.8 10*3/uL (ref 4.0–10.5)
nRBC: 0 % (ref 0.0–0.2)

## 2018-10-20 LAB — COMPREHENSIVE METABOLIC PANEL
ALT: 19 U/L (ref 0–44)
AST: 27 U/L (ref 15–41)
Albumin: 4.4 g/dL (ref 3.5–5.0)
Alkaline Phosphatase: 64 U/L (ref 38–126)
Anion gap: 12 (ref 5–15)
BUN: 14 mg/dL (ref 8–23)
CO2: 21 mmol/L — ABNORMAL LOW (ref 22–32)
Calcium: 9.4 mg/dL (ref 8.9–10.3)
Chloride: 106 mmol/L (ref 98–111)
Creatinine, Ser: 0.85 mg/dL (ref 0.61–1.24)
GFR calc Af Amer: >60 mL/min (ref >60)
GFR calc non Af Amer: >60 mL/min (ref >60)
Glucose, Bld: 93 mg/dL (ref 70–99)
Potassium: 4.2 mmol/L (ref 3.5–5.1)
Sodium: 139 mmol/L (ref 135–145)
Total Bilirubin: 0.7 mg/dL (ref 0.3–1.2)
Total Protein: 7.4 g/dL (ref 6.5–8.1)

## 2018-10-20 LAB — LACTATE DEHYDROGENASE: LDH: 171 U/L (ref 98–192)

## 2018-10-21 ENCOUNTER — Ambulatory Visit (HOSPITAL_COMMUNITY): Admission: RE | Admit: 2018-10-21 | Payer: Medicare Other | Source: Ambulatory Visit

## 2018-10-21 ENCOUNTER — Ambulatory Visit (HOSPITAL_COMMUNITY): Payer: Medicare Other

## 2018-10-21 LAB — BETA 2 MICROGLOBULIN, SERUM: Beta-2 Microglobulin: 1.1 mg/L (ref 0.6–2.4)

## 2018-10-21 LAB — TESTOSTERONE: Testosterone: 476 ng/dL (ref 264–916)

## 2018-10-22 ENCOUNTER — Telehealth: Payer: Self-pay | Admitting: Oncology

## 2018-10-22 NOTE — Telephone Encounter (Signed)
Pt wife called in to ask if appt can be a virtual visit. Message sent to RN and I called pt back to confirm it can . Unable to reach anyone so I left message stating it will be a [place] and if that doesn't work for patient to call back and it will be a phone visit. Also confirmed with pt that appt should be enough time from Scans to be resulted

## 2018-10-27 ENCOUNTER — Ambulatory Visit (HOSPITAL_COMMUNITY)
Admission: RE | Admit: 2018-10-27 | Discharge: 2018-10-27 | Disposition: A | Discharge status: Home / Self Care | Payer: Medicare Other | Source: Ambulatory Visit | Attending: Oncology | Admitting: Oncology

## 2018-10-27 ENCOUNTER — Other Ambulatory Visit: Payer: Self-pay

## 2018-10-27 ENCOUNTER — Other Ambulatory Visit: Payer: Self-pay | Admitting: Oncology

## 2018-10-27 DIAGNOSIS — Z79899 Other long term (current) drug therapy: Secondary | ICD-10-CM | POA: Diagnosis not present

## 2018-10-27 DIAGNOSIS — C8330 Diffuse large B-cell lymphoma, unspecified site: Secondary | ICD-10-CM

## 2018-10-27 DIAGNOSIS — K219 Gastro-esophageal reflux disease without esophagitis: Secondary | ICD-10-CM | POA: Insufficient documentation

## 2018-10-27 DIAGNOSIS — C8580 Other specified types of non-Hodgkin lymphoma, unspecified site: Secondary | ICD-10-CM | POA: Diagnosis present

## 2018-10-27 DIAGNOSIS — Z7189 Other specified counseling: Secondary | ICD-10-CM | POA: Insufficient documentation

## 2018-10-27 DIAGNOSIS — C8599 Non-Hodgkin lymphoma, unspecified, extranodal and solid organ sites: Secondary | ICD-10-CM

## 2018-10-27 DIAGNOSIS — Z85828 Personal history of other malignant neoplasm of skin: Secondary | ICD-10-CM | POA: Diagnosis not present

## 2018-10-27 DIAGNOSIS — Z87442 Personal history of urinary calculi: Secondary | ICD-10-CM | POA: Insufficient documentation

## 2018-10-27 DIAGNOSIS — I700 Atherosclerosis of aorta: Secondary | ICD-10-CM | POA: Diagnosis not present

## 2018-10-27 DIAGNOSIS — Z87891 Personal history of nicotine dependence: Secondary | ICD-10-CM | POA: Insufficient documentation

## 2018-10-27 LAB — GLUCOSE, CAPILLARY: Glucose-Capillary: 102 mg/dL — ABNORMAL HIGH (ref 70–99)

## 2018-10-27 MED ORDER — GADOBUTROL 1 MMOL/ML IV SOLN
8.0000 mL | Freq: Once | INTRAVENOUS | Status: AC | PRN
  Administered 2018-10-27: 15:00:00 8 mL via INTRAVENOUS

## 2018-10-27 MED ORDER — FLUDEOXYGLUCOSE F - 18 (FDG) INJECTION
9.7700 | Freq: Once | INTRAVENOUS | Status: AC | PRN
  Administered 2018-10-27: 12:00:00 9.77 via INTRAVENOUS

## 2018-10-27 NOTE — Progress Notes (Signed)
DISCONTINUE ON PATHWAY REGIMEN - Lymphoma and CLL     A cycle is every 21 days:     Prednisone      Rituximab      Cyclophosphamide      Doxorubicin      Vincristine   **Always confirm dose/schedule in your pharmacy ordering system**  REASON: Disease Progression PRIOR TREATMENT: [id number]: R(IV)-CHOP q21 Days x 6 Cycles TREATMENT RESPONSE: Unable to Evaluate  START ON PATHWAY REGIMEN - Lymphoma and CLL     A cycle is every 14 days:     Rituximab-xxxx      Gemcitabine      Oxaliplatin   **Always confirm dose/schedule in your pharmacy ordering system**  Patient Characteristics: Diffuse Large B-Cell Lymphoma or Follicular Lymphoma, Grade 3B, Relapsed / Refractory, All Stages,  Second Line, Not a Transplant Candidate Disease Type: Not Applicable Disease Type: Diffuse Large B-Cell Lymphoma Disease Type: Not Applicable Line of therapy: Relapsed / Refractory - Second Line [place] Stage: III Patient Characteristics: Not a Transplant Candidate Intent of Therapy: Non-Curative / Palliative Intent, Discussed with Patient

## 2018-10-27 NOTE — Progress Notes (Unsigned)
[facility]  Telephone:([phone]) [phone] Fax:(336) [phone]   ID: Talyn Dessert DOB: 01/13/1946  MR#: [id number]  DIY#:[id number]  Patient Care Team: Seward Carol, MD as PCP - General (Internal Medicine) [name], MD as Consulting Physician (Urology) Jezabelle Chisolm, [name], MD as Consulting Physician (Oncology) [name], MD as Consulting Physician (Ophthalmology) [name], MD as Consulting Physician (Radiation Oncology) [name], MD OTHER MD:   CHIEF COMPLAINT: Diffuse large cell non-Hodgkin's lymphoma, germinal center B-cell subtype  CURRENT TREATMENT: observaton   HISTORY OF CURRENT ILLNESS: From the original intake note 12/27/2017:  "The patient was referred to Dr. [name] for renal stones.  Incidentally he was noted to have left scrotal swelling.  This had been present several months and was initially felt to be a hydrocele.  Scrotal ultrasound was performed 12/06/2017.  This found no spermatocele but a solid mass in the left testis upper and lower pole.  Accordingly on 12/20/2017 the patient underwent left inguinal orchiectomy.  The pathology from this procedure (SZ [id number]) showed a diffuse large B cell non-Hodgkin's lymphoma, CD20 positive, CD10 weakly positive, no coexpression of CD5, and most consistent with a germinal center B-cell subtype.  I was called by Dr. [name] earlier today and the patient was brought in for further evaluation."  His subsequent history is as detailed below.   INTERVAL HISTORY: Chayson returns today for follow-up and treatment of his large cell B-cell non-Hodgkin's lymphoma. He is accompanied by his wife.  He completed radiation on 04/25/2018. Last week, he noticed some burning and blistering of the skin over his pelvic area. He has prescribed radio Plex cream to treat.   Since his last visit here, he underwent a chest xray on 03/13/2018 showing old left rib fracture deformities. Scarring laterally at  the left lung base. Lungs otherwise clear. Heart size normal. No effusion. No pneumothorax. No focal infiltrate or overt edema.   REVIEW OF SYSTEMS:  Gabreil lost all of his hair from chemo. Now, he can go three or four days without shaving. The hair on his head has began to grow back. He has constipation, with some irritation at the rectum. He has noticed that his urine stream is weak if he wakes up late at night; his urine stream during the day is normal, however. Back during chemotherapy, he developed a strong cough and he ran a mild fever; his wife was tested positive for the flu. The patient denies unusual headaches, visual changes, nausea, vomiting, or dizziness. There has been no unusual phlegm production, or pleurisy. This been no change in bowel habits. The patient denies unexplained fatigue or unexplained weight loss, or bleeding. A detailed review of systems was otherwise noncontributory.    PAST MEDICAL HISTORY: Past Medical History:  Diagnosis Date   Chalazion    Dental crowns present    GERD (gastroesophageal reflux disease)    History of kidney stones 10/25/2017   right nonobstructing stone noted on CT ABD/Pelvis   Hydronephrosis 10/25/2017   Mild to moderate right due to 28m stone right distal, noted on CT abd/pelvis   Hydroureter, right 10/25/2017   449mstone right distal, noted on CT abd/pelvis   Impaired fasting glucose    Migraine    optical   Plantar fasciitis 2003   PONV (postoperative nausea and vomiting)    light   Pure hyperglyceridemia    Rectal bleeding 2002   Recurrent spontaneous pneumothorax    due to blebsx2   Skin cancer of scalp 2011  Transfusion of blood product refused for religious reason    Ureteral stone 10/25/2017   81m stone right distal, noted on CT abd/pelvis   Wears glasses     PAST SURGICAL HISTORY: Past Surgical History:  Procedure Laterality Date   CHALAZION EXCISION Right    cyst removal , right eye   COLONOSCOPY      HEMORRHOID SURGERY  2002   INGUINAL HERNIA REPAIR Bilateral 2005   wire mesh   LUNG SURGERY     x2   ORCHIECTOMY Left 12/20/2017   Procedure: ORCHIECTOMY;  Surgeon: DFranchot Gallo MD;  [facility]  Service: Urology;  Laterality: Left;   TONSILLECTOMY      FAMILY HISTORY Family History  Problem Relation Age of Onset   Dementia Mother        progressive   Breast cancer Mother    Heart Problems Father        pacemaker   CAD Father    Heart disease Father    The patient's father died from heart and lung problems in his 871s  The patient's mother had breast cancer in her 744s died in her 741sfrom Alzheimer's disease.  The patient had no brothers, 2 sisters.  One sister has what sounds like lung cancer metastatic to the brain, in her 743s  One maternal cousin was diagnosed with Ms. to have been breast cancer metastatic to the lung in her 579s   SOCIAL HISTORY:  The patient retired from [employer] than [employer] years ago.  He is very active in the [employer].   His wife NBonnita Nasutiis an education major but mostly worked as a housewife.  Daughter BEustaquio Maizeis a "mom", and sign language interpreted as well as very active in the natural path movement.  [place]  [place] [employer]  The patient has 2 grandchildren   ADVANCED DIRECTIVES: Please note patient would refuse a blood transfusion even for lifesaving reasons (discussed 12/27/2017)   HEALTH MAINTENANCE: Social History   Tobacco Use   Smoking status: Former Smoker   Smokeless tobacco: Never Used   Tobacco comment: age [age] Substance Use Topics   Alcohol use: Yes    Comment: occ beer   Drug use: Never     Colonoscopy: 2015  PSA:  Bone density:   Allergies  Allergen Reactions   Blood-Group Specific Substance    Oxycodone    Penicillins    Shellfish Allergy Hives    Shell fish and shrimp    Current  Outpatient Medications  Medication Sig Dispense Refill   benzonatate (TESSALON) 200 MG capsule Take 1 capsule (200 mg total) by mouth 3 (three) times daily as needed for cough. 30 capsule 0   metroNIDAZOLE (METROGEL) 1 % gel Apply topically daily. 45 g 0   valACYclovir (VALTREX) 500 MG tablet Take 1 tablet (500 mg total) by mouth 2 (two) times daily. 90 tablet 3   No current facility-administered medications for this visit.     OBJECTIVE: Middle-aged white man in no acute distress   There were no vitals filed for this visit.   There is no height or weight on file to calculate BMI.   Wt Readings from Last 3 Encounters:  04/29/18 181 lb 9.6 oz (82.4 kg)  03/11/18 181 lb (82.1 kg)  03/03/18 181 lb 8 oz (82.3 kg)      ECOG FS:1 - Symptomatic but  completely ambulatory  Sclerae unicteric, pupils round and equal, no eyelashes, sparse eyebrows Oropharynx clear and moist No cervical or supraclavicular adenopathy, no axillary or inguinal adenopathy Lungs no rales or rhonchi Heart regular rate and rhythm Abd soft, nontender, positive bowel sounds, no masses palpated Pelvis: Scrotal erythema and mild dry desquamation, right testicle not atrophic MSK no focal spinal tenderness Neuro: nonfocal, well oriented, positive affect  LAB RESULTS:  CMP     Component Value Date/Time   NA 139 10/20/2018 1403   K 4.2 10/20/2018 1403   CL 106 10/20/2018 1403   CO2 21 (L) 10/20/2018 1403   GLUCOSE 93 10/20/2018 1403   BUN 14 10/20/2018 1403   CREATININE 0.85 10/20/2018 1403   CREATININE 0.74 03/11/2018 1205   CALCIUM 9.4 10/20/2018 1403   PROT 7.4 10/20/2018 1403   ALBUMIN 4.4 10/20/2018 1403   AST 27 10/20/2018 1403   AST 15 03/11/2018 1205   ALT 19 10/20/2018 1403   ALT 14 03/11/2018 1205   ALKPHOS 64 10/20/2018 1403   BILITOT 0.7 10/20/2018 1403   BILITOT 0.8 03/11/2018 1205   GFRNONAA >60 10/20/2018 1403   GFRNONAA >60 03/11/2018 1205   GFRAA >60 10/20/2018 1403   GFRAA >60  03/11/2018 1205    No results found for: Ronnald Ramp, A1GS, A2GS, BETS, BETA2SER, GAMS, MSPIKE, SPEI  No results found for: Nils Pyle, [facility]  Lab Results  Component Value Date   WBC 5.8 10/20/2018   NEUTROABS 3.5 10/20/2018   HGB 14.6 10/20/2018   HCT 43.3 10/20/2018   MCV 92.3 10/20/2018   PLT 191 10/20/2018      Chemistry      Component Value Date/Time   NA 139 10/20/2018 1403   K 4.2 10/20/2018 1403   CL 106 10/20/2018 1403   CO2 21 (L) 10/20/2018 1403   BUN 14 10/20/2018 1403   CREATININE 0.85 10/20/2018 1403   CREATININE 0.74 03/11/2018 1205      Component Value Date/Time   CALCIUM 9.4 10/20/2018 1403   ALKPHOS 64 10/20/2018 1403   AST 27 10/20/2018 1403   AST 15 03/11/2018 1205   ALT 19 10/20/2018 1403   ALT 14 03/11/2018 1205   BILITOT 0.7 10/20/2018 1403   BILITOT 0.8 03/11/2018 1205       No results found for: LABCA2  No components found for: [id number]  No results for input(s): INR in the last 168 hours.  No results found for: LABCA2  No results found for: FOY774  No results found for: [id number]  No results found for: VEH209  No results found for: CA2729  No components found for: HGQUANT  No results found for: CEA1 / No results found for: CEA1   No results found for: AFPTUMOR  No results found for: [place]  No results found for: [facility] Outpatient Visit on 10/27/2018  Component Date Value Ref Range Status   Glucose-Capillary 10/27/2018 102* 70 - 99 mg/dL Final    (this displays the last labs from the last 3 days)  No results found for: TOTALPROTELP, ALBUMINELP, A1GS, A2GS, BETS, BETA2SER, GAMS, MSPIKE, SPEI (this displays SPEP labs)  No results found for: KPAFRELGTCHN, LAMBDASER, KAPLAMBRATIO (kappa/lambda light chains)  No results found for: HGBA, HGBA2QUANT, HGBFQUANT, HGBSQUAN (Hemoglobinopathy evaluation)   Lab Results  Component Value Date   LDH 171 10/20/2018    No results  found for: IRON, TIBC, IRONPCTSAT (Iron and TIBC)  No results found for: FERRITIN  Urinalysis No results found for: COLORURINE, APPEARANCEUR,  LABSPEC, PHURINE, GLUCOSEU, HGBUR, BILIRUBINUR, KETONESUR, PROTEINUR, UROBILINOGEN, NITRITE, LEUKOCYTESUR   STUDIES: Nm Pet Image Restag (ps) Skull Base To Thigh  Result Date: 10/27/2018 CLINICAL DATA:  Subsequent treatment strategy for diffuse large B-cell lymphoma of the left testis status post left orchiectomy 12/20/2017 and chemotherapy. EXAM: NUCLEAR MEDICINE PET SKULL BASE TO THIGH TECHNIQUE: 9.8 mCi F-18 FDG was injected intravenously. Full-ring PET imaging was performed from the skull base to thigh after the radiotracer. CT data was obtained and used for attenuation correction and anatomic localization. Fasting blood glucose: 102 mg/dl COMPARISON:  12/31/2017 PET-CT. FINDINGS: Mediastinal blood pool activity: SUV max 2.9 Liver activity: SUV max 3.7 NECK: No hypermetabolic lymph nodes in the neck. Incidental CT findings: none CHEST: No enlarged or hypermetabolic axillary, mediastinal or hilar lymph nodes. No hypermetabolic pulmonary findings. Incidental CT findings: Minimally atherosclerotic nonaneurysmal thoracic aorta. No acute consolidative airspace disease, lung masses or significant pulmonary nodules. Stable nonspecific patchy subpleural reticulation and parenchymal banding in the mid to lower lungs bilaterally. ABDOMEN/PELVIS: There is intense hypermetabolism (max SUV 14.2) associated with a new 2.2 x 1.1 cm posterior left mesenteric lymph node (series 4/image 140). No additional enlarged or hypermetabolic lymph nodes in the abdomen or pelvis. Nonspecific hypermetabolism in the right testis (max SUV 6.1) without discrete mass on the CT images, previous right testicular max SUV 4.5, mildly increased. No abnormal hypermetabolic activity within the liver, pancreas, adrenal glands, or spleen. Incidental CT findings: Scattered simple liver cysts, largest  2.7 cm in the lateral segment left liver lobe. Stable granulomatous splenic calcification. Moderate prostatomegaly. Moderate left colonic diverticulosis. Atherosclerotic nonaneurysmal abdominal aorta. SKELETON: No focal hypermetabolic activity to suggest skeletal metastasis. Incidental CT findings: none IMPRESSION: 1. Intense hypermetabolism associated with a new 2.2 x 1.1 cm posterior left mesenteric lymph node, compatible with recurrent lymphoma. Deauville score 5. 2. Nonspecific right testicular hypermetabolism without discrete right testicular mass on the CT images, mildly increased from prior PET-CT. Findings are most likely physiologic, although involvement of the right testicle by lymphoma is difficult to exclude. 3. No additional potential sites of hypermetabolic lymphoma. 4.  Aortic Atherosclerosis (ICD10-I70.0). Electronically Signed   By: Ilona Sorrel M.D.   On: 10/27/2018 14:23     ELIGIBLE FOR AVAILABLE RESEARCH PROTOCOL: no  ASSESSMENT: 73 y.o. Jehovah's Witness status post left inguinal orchiectomy 12/20/2017 for a diffuse large cell non-Hodgkin's lymphoma, germinal center B-cell subtype, CD20 positive, CD5 negative.  (1) staging studies: (a) PET scan 12/31/2017: Uptake only in the right testicle and panniculus, both of questionable significance (b) bone marrow biopsy 01/01/2018 shows no evidence of lymphoma (c) lumbar puncture 02/27/2018 benign  (2) international prognostic index: 2 = 80% predicted PFS4; stage I (or II if panniculus involved)  (3) cyclophosphamide, doxorubicin, vincristine, prednisone, rituximab started 01/16/2018, completed 03/03/2018 (a) received 3 cycles of CHOP-R  (4) adjuvant radiation completed 04/25/2018  PLAN: Ismar has completed active treatment for his testicular lymphoma.  Overall he tolerated treatment remarkably well.  He has not got his hair back, and he still has an energy deficit.  He is also having some constipation issues and we discussed how  dangerous it is for someone particularly whose immune system is not in perfect shape to have very hard bowel movements.  He will start stool softeners 2 tablets twice daily and continue until that problem is resolved.  I reassured him that the skin of his scrotum will heal rather quickly.  There may be some hyperpigmentation for some time.  We are going to  restage him with a PET scan in June.  Prior to that he will have restaging labs which will include a beta-2 microglobulin, LDH, and testosterone level  He knows to call for any other issue that may develop before the next visit.   Rovena Hearld, [name], MD  10/27/18 4:31 PM Medical Oncology and Hematology [facility] [address] [place], [place] [address] Tel. [phone]    Fax. [phone]   I, Jacqualyn Posey am acting as a Education administrator for [name], MD.   I, Lurline Del MD, have reviewed the above documentation for accuracy and completeness, and I agree with the above.

## 2018-10-28 ENCOUNTER — Other Ambulatory Visit: Payer: Self-pay | Admitting: Oncology

## 2018-10-29 ENCOUNTER — Ambulatory Visit: Payer: Medicare Other | Admitting: Oncology

## 2018-10-30 NOTE — Progress Notes (Signed)
[facility]  [phone] [phone]) [phone]   ID: Michael Allen DOB: 1945-08-19  MR#: [id number]  MGQ#:[id number]  Patient Care Team: Seward Carol, MD as PCP - General (Internal Medicine) [name], MD as Consulting Physician (Urology) Shadonna Benedick, [name], MD as Consulting Physician (Oncology) [name], MD as Consulting Physician (Ophthalmology) [name], MD as Consulting Physician (Radiation Oncology) [facility] OTHER MD:   CHIEF COMPLAINT: Diffuse large cell non-Hodgkin's lymphoma, germinal center B-cell subtype  CURRENT TREATMENT: Awaiting on biopsy results   HISTORY OF CURRENT ILLNESS: From the original intake note 12/27/2017:  "The patient was referred to Dr. [name] for renal stones.  Incidentally he was noted to have left scrotal swelling.  This had been present several months and was initially felt to be a hydrocele.  Scrotal ultrasound was performed 12/06/2017.  This found no spermatocele but a solid mass in the left testis upper and lower pole.  Accordingly on 12/20/2017 the patient underwent left inguinal orchiectomy.  The pathology from this procedure (SZ [id number]) showed a diffuse large B cell non-Hodgkin's lymphoma, CD20 positive, CD10 weakly positive, no coexpression of CD5, and most consistent with a germinal center B-cell subtype.  I was called by Dr. [name] earlier today and the patient was brought in for further evaluation."  His subsequent history is as detailed below.   INTERVAL HISTORY: Perfecto returns today for follow-up and treatment of his large cell B-cell non-Hodgkin's lymphoma. He was last seen on 04/29/2018.  [name] and their daughter participated via FaceTime  Since his last visit here, he underwent a restaging PET scan on 10/27/2018 showing:  Intense hypermetabolism associated with a new 2.2 x 1.1 cm posterior left mesenteric lymph node, compatible with recurrent lymphoma. Deauville score 5.  Nonspecific right testicular hypermetabolism without discrete right testicular mass on the CT images, mildly increased from prior PET-CT. Findings are most likely physiologic, although involvement of the right testicle by lymphoma is difficult to exclude. No additional potential sites of hypermetabolic lymphoma. Aortic Atherosclerosis (ICD10-I70.0).  He also underwent a brain MRI with and without contrast on 10/27/2018 showing: Negative for metastatic disease.  No acute intracranial abnormality.  Lab Results  Component Value Date   LDH 171 10/20/2018   LDH 165 04/29/2018   LDH 163 03/11/2018   LDH 179 03/03/2018   LDH 259 (H) 02/17/2018   Lab Results  Component Value Date   TESTOSTERONE 476 10/20/2018   TESTOSTERONE 630 03/03/2018   He is here to discuss the management of presumed recurrent diffuse large cell B-cell non-Hodgkin's lymphoma   REVIEW OF SYSTEMS:  Ronnald maintains a very good functional status for his age.  He mows his yard with a push (motor) mower.  He feels he could easily walk 3 miles on a cool day without difficulty.  He has had no sweats, fevers, unusual fatigue, or any weight loss.  He is not aware of any adenopathy.  A detailed review of systems today was otherwise stable.   PAST MEDICAL HISTORY: Past Medical History:  Diagnosis Date  . Chalazion   . Dental crowns present   . GERD (gastroesophageal reflux disease)   . History of kidney stones 10/25/2017   right nonobstructing stone noted on CT ABD/Pelvis  . Hydronephrosis 10/25/2017   Mild to moderate right due to 11m stone right distal, noted on CT abd/pelvis  . Hydroureter, right 10/25/2017   482mstone right distal, noted on CT abd/pelvis  . Impaired fasting glucose   .  Migraine    optical  . Plantar fasciitis 2003  . PONV (postoperative nausea and vomiting)    light  . Pure hyperglyceridemia   . Rectal bleeding 2002  . Recurrent spontaneous pneumothorax    due to blebsx2  . Skin cancer of scalp 2011   . Transfusion of blood product refused for religious reason   . Ureteral stone 10/25/2017   36m stone right distal, noted on CT abd/pelvis  . Wears glasses     PAST SURGICAL HISTORY: Past Surgical History:  Procedure Laterality Date  . CHALAZION EXCISION Right    cyst removal , right eye  . COLONOSCOPY    . [facility] 2002  . INGUINAL HERNIA REPAIR Bilateral 2005   wire mesh  . LUNG SURGERY     x2  . ORCHIECTOMY Left 12/20/2017   Procedure: ORCHIECTOMY;  Surgeon: DFranchot Gallo MD;  [facility]  Service: Urology;  Laterality: Left;  . TONSILLECTOMY      FAMILY HISTORY Family History  Problem Relation Age of Onset  . Dementia Mother        progressive  . Breast cancer Mother   . Heart Problems Father        pacemaker  . CAD Father   . Heart disease Father    The patient's father died from heart and lung problems in his 838s  The patient's mother had breast cancer in her 553s died in her 932sfrom Alzheimer's disease.  The patient had no brothers, 2 sisters.  One sister has what sounds like lung cancer metastatic to the brain, in her 731s  One maternal cousin was diagnosed with Ms. to have been breast cancer metastatic to the lung in her 548s   SOCIAL HISTORY:  The patient retired from [employer] than 20 years ago.  He is very active in the [employer] ministry.   His wife NBonnita Nasutiis an education major but mostly worked as a housewife.  Daughter BEustaquio Maizeis a "mom", and sign language interpreted as well as very active in the natural path movement.  [place]  [place] [employer]  The patient has 2 grandchildren   ADVANCED DIRECTIVES: Please note patient would refuse a blood transfusion even for lifesaving reasons (discussed 12/27/2017)   HEALTH MAINTENANCE: Social History   Tobacco Use  . 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 Smokeless tobacco: Never Used  . Tobacco  comment: age 73 Substance Use Topics  . Alcohol use: Yes    Comment: occ beer  . Drug use: Never     Colonoscopy: 2015  PSA:  Bone density:   Allergies  Allergen Reactions  . Blood-Group Specific Substance   . Oxycodone   . Penicillins   . Shellfish Allergy Hives    Shell fish and shrimp    Current Outpatient Medications  Medication Sig Dispense Refill  . benzonatate (TESSALON) 200 MG capsule Take 1 capsule (200 mg total) by mouth 3 (three) times daily as needed for cough. 30 capsule 0  . metroNIDAZOLE (METROGEL) 1 % gel Apply topically daily. 45 g 0  . valACYclovir (VALTREX) 500 MG tablet Take 1 tablet (500 mg total) by mouth 2 (two) times daily. 90 tablet 3   No current facility-administered medications for this visit.     OBJECTIVE: Middle-aged white man who appears well   There were no vitals filed for this visit.   There  is no height or weight on file to calculate BMI.   Wt Readings from Last 3 Encounters:  04/29/18 181 lb 9.6 oz (82.4 kg)  03/11/18 181 lb (82.1 kg)  03/03/18 181 lb 8 oz (82.3 kg)   ECOG FS:0 - Asymptomatic  His hair has come back curly white ("I looked like a poodle at first". Sclerae unicteric, EOMs intact Wearing a mask No cervical or supraclavicular adenopathy, no axillary or inguinal adenopathy Lungs no rales or rhonchi Heart regular rate and rhythm Abd soft, nontender, positive bowel sounds, no palpable adenopathy Scrotum shows no right testicular mass MSK no focal spinal tenderness, no upper extremity lymphedema Neuro: nonfocal, well oriented, appropriate affect   LAB RESULTS:  CMP     Component Value Date/Time   NA 139 10/20/2018 1403   K 4.2 10/20/2018 1403   CL 106 10/20/2018 1403   CO2 21 (L) 10/20/2018 1403   GLUCOSE 93 10/20/2018 1403   BUN 14 10/20/2018 1403   CREATININE 0.85 10/20/2018 1403   CREATININE 0.74 03/11/2018 1205   CALCIUM 9.4 10/20/2018 1403   PROT 7.4 10/20/2018 1403   ALBUMIN 4.4 10/20/2018 1403    AST 27 10/20/2018 1403   AST 15 03/11/2018 1205   ALT 19 10/20/2018 1403   ALT 14 03/11/2018 1205   ALKPHOS 64 10/20/2018 1403   BILITOT 0.7 10/20/2018 1403   BILITOT 0.8 03/11/2018 1205   GFRNONAA >60 10/20/2018 1403   GFRNONAA >60 03/11/2018 1205   GFRAA >60 10/20/2018 1403   GFRAA >60 03/11/2018 1205    No results found for: Ronnald Ramp, A1GS, A2GS, BETS, BETA2SER, GAMS, MSPIKE, SPEI  No results found for: Nils Pyle, [facility]  Lab Results  Component Value Date   WBC 5.8 10/20/2018   NEUTROABS 3.5 10/20/2018   HGB 14.6 10/20/2018   HCT 43.3 10/20/2018   MCV 92.3 10/20/2018   PLT 191 10/20/2018      Chemistry      Component Value Date/Time   NA 139 10/20/2018 1403   K 4.2 10/20/2018 1403   CL 106 10/20/2018 1403   CO2 21 (L) 10/20/2018 1403   BUN 14 10/20/2018 1403   CREATININE 0.85 10/20/2018 1403   CREATININE 0.74 03/11/2018 1205      Component Value Date/Time   CALCIUM 9.4 10/20/2018 1403   ALKPHOS 64 10/20/2018 1403   AST 27 10/20/2018 1403   AST 15 03/11/2018 1205   ALT 19 10/20/2018 1403   ALT 14 03/11/2018 1205   BILITOT 0.7 10/20/2018 1403   BILITOT 0.8 03/11/2018 1205       No results found for: LABCA2  No components found for: [id number]  No results for input(s): INR in the last 168 hours.  No results found for: LABCA2  No results found for: XQJ194  No results found for: RDE081  No results found for: KGY185  No results found for: CA2729  No components found for: HGQUANT  No results found for: CEA1 / No results found for: CEA1   No results found for: AFPTUMOR  No results found for: CHROMOGRNA  No results found for: PSA1  No visits with results within 3 Day(s) from this visit.  Latest known visit with results is:  Hospital Outpatient Visit on 10/27/2018  Component Date Value Ref Range Status  . Glucose-Capillary 10/27/2018 102* 70 - 99 mg/dL Final    (this displays the last labs from the last 3  days)  No results found for: TOTALPROTELP, ALBUMINELP, A1GS, A2GS, BETS, BETA2SER,  GAMS, MSPIKE, SPEI (this displays SPEP labs)  No results found for: KPAFRELGTCHN, LAMBDASER, KAPLAMBRATIO (kappa/lambda light chains)  No results found for: HGBA, HGBA2QUANT, HGBFQUANT, HGBSQUAN (Hemoglobinopathy evaluation)   Lab Results  Component Value Date   LDH 171 10/20/2018    No results found for: IRON, TIBC, IRONPCTSAT (Iron and TIBC)  No results found for: FERRITIN  Urinalysis No results found for: COLORURINE, APPEARANCEUR, LABSPEC, PHURINE, GLUCOSEU, HGBUR, BILIRUBINUR, KETONESUR, PROTEINUR, UROBILINOGEN, NITRITE, LEUKOCYTESUR   STUDIES: Mr Jeri Cos Wo Contrast  Result Date: 10/27/2018 CLINICAL DATA:  Testicular cancer staging EXAM: MRI HEAD WITHOUT AND WITH CONTRAST TECHNIQUE: Multiplanar, multiecho pulse sequences of the brain and surrounding structures were obtained without and with intravenous contrast. CONTRAST:  8 mL Gadovist IV COMPARISON:  CT head 03/23/2009 FINDINGS: Brain: Ventricle size and cerebral volume normal for age. Negative for acute infarct. Scattered small white matter hyperintensities appear chronic. Negative for mass or hemorrhage. No edema or midline shift. 8 x 12 mm pineal cyst, an incidental finding. Normal enhancement.  No enhancing metastatic deposits. Vascular: Normal arterial flow voids. Skull and upper cervical spine: Negative Sinuses/Orbits: Negative Other: None IMPRESSION: Negative for metastatic disease.  No acute intracranial abnormality. Electronically Signed   By: [name] M.D.   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 EXAM: NUCLEAR MEDICINE PET SKULL BASE TO THIGH TECHNIQUE: 9.8 mCi F-18 FDG was injected intravenously. Full-ring PET imaging was  performed from the skull base to thigh after the radiotracer. CT data was obtained and used for attenuation correction and anatomic localization. Fasting blood glucose: 102 mg/dl COMPARISON:  12/31/2017 PET-CT. FINDINGS: Mediastinal blood pool activity: SUV max 2.9 Liver activity: SUV max 3.7 NECK: No hypermetabolic lymph nodes in the neck. Incidental CT findings: none CHEST: No enlarged or hypermetabolic axillary, mediastinal or hilar lymph nodes. No hypermetabolic pulmonary findings. Incidental CT findings: Minimally atherosclerotic nonaneurysmal thoracic aorta. No acute consolidative airspace disease, lung masses or significant pulmonary nodules. Stable nonspecific patchy subpleural reticulation and parenchymal banding in the mid to lower lungs bilaterally. ABDOMEN/PELVIS: There is intense hypermetabolism (max SUV 14.2) associated with a new 2.2 x 1.1 cm posterior left mesenteric lymph node (series 4/image 140). No additional enlarged or hypermetabolic lymph nodes in the abdomen or pelvis. Nonspecific hypermetabolism in the right testis (max SUV 6.1) without discrete mass on the CT images, previous right testicular max SUV 4.5, mildly increased. No abnormal hypermetabolic activity within the liver, pancreas, adrenal glands, or spleen. Incidental CT findings: Scattered simple liver cysts, largest 2.7 cm in the lateral segment left liver lobe. Stable granulomatous splenic calcification. Moderate prostatomegaly. Moderate left colonic diverticulosis. Atherosclerotic nonaneurysmal abdominal aorta. SKELETON: No focal hypermetabolic activity to suggest skeletal metastasis. Incidental CT findings: none IMPRESSION: 1. Intense hypermetabolism associated with a new 2.2 x 1.1 cm posterior left mesenteric lymph node, compatible with recurrent lymphoma. Deauville score 5. 2. Nonspecific right testicular hypermetabolism without discrete right testicular mass on the CT images, mildly increased from prior PET-CT. Findings are  most likely physiologic, although involvement of the right testicle by lymphoma is difficult to exclude. 3. No additional potential sites of hypermetabolic lymphoma. 4.  Aortic Atherosclerosis (ICD10-I70.0). Electronically Signed   By: Ilona Sorrel M.D.   On: 10/27/2018 14:23     ELIGIBLE FOR AVAILABLE RESEARCH PROTOCOL: no  ASSESSMENT: 73 y.o.  [employer] status post left inguinal orchiectomy 12/20/2017 for a diffuse large cell non-Hodgkin's lymphoma, germinal center B-cell subtype, CD20 positive, CD5 negative.  (1) staging studies: (a) PET scan 12/31/2017: Uptake only in the right testicle and panniculus, both of questionable significance (b) bone marrow biopsy 01/01/2018 shows no evidence of lymphoma (c) lumbar puncture 02/27/2018 benign  (2) international prognostic index: 2 = 80% predicted PFS4; stage I (or II if panniculus involved)  (3) adjuvant chemotherapy: cyclophosphamide, doxorubicin, vincristine, prednisone, rituximab   (a) hep B surface Ag and cor Ab negative OCT 2019  (b) received 3 cycles of CHOP-R started 01/16/2018, completed 03/03/2018  (4) adjuvant radiation 04/02/18-04/25/18 Site/dose:  Right Testicle/Scrotum, 18 fractions of 1.8 Gy for a total of 32.4 Gy  RECURRENT DISEASE: AUG 2020 (5) PET scan 10/27/2018 shows a left mesenteric lymph node, 2.2 cm, SUV 14.2; questionable increased SUV right testicle without apparent mass  (a) brain MRI 10/27/2018 no evidence of metastatic disease  PLAN: Venus's restaging PET scan suggests recurrent disease, 8 months after his chemotherapy was completed.  This is very worrisome.  He understands that this may not be lymphoma or if it is lymphoma it may be a different subclass.  For that reason I strongly recommend biopsy.  I have discussed this with interventional radiology and they do not feel they can safely perform this because of vascular I concerns near the lymphoma.  I have discussed this with Dr. Lucia Gaskins and he feels a  laparotomy may be able to give [place] the diagnosis.  There is a possibility we may go in there and not be able to identify the lymph node in which case we would not go around again given the vascular structures near this particular lymph node.  I discussed all that with Mr. Storlie.  He is agreeable to proceeding.  The goal is to obtain at least a good portion of the lymph node if not the entire lymph node, which will guide therapy.  We then discussed the treatment of recurrent diffuse large cell B-cell non-Hodgkin's lymphoma.  He understands the only way to cure this is with allogeneic transplantation and we went over the initial chemotherapy, high-dose treatment, transplant from a donor, and then graft-versus-host disease.  The patient I do not think is a good candidate for this and he agrees that he does not want to go that route.  If we are dealing with diffuse large cell lymphoma then we would go with oxaliplatin and gemcitabine with rituximab every 14 days x 8.  We would repeat a PET scan after 4 cycles.  If we are getting places then after 8 cycles, hopefully into some kind of remission, we would switch to ibrutinib.  Sachit and his family have a good understanding of this plan.  They agree with it. I will get back with Jenny Reichmann as soon as we have results of his biopsy.    Lamari Beckles, [name], MD  10/30/18 1:26 PM Medical Oncology and Hematology [facility] [address] [place], [place] [address] Tel. [phone]    Fax. [phone]   I, Jacqualyn Posey am acting as a Education administrator for [name], MD.   I, Lurline Del MD, have reviewed the above documentation for accuracy and completeness, and I agree with the above.

## 2018-10-31 ENCOUNTER — Inpatient Hospital Stay (HOSPITAL_BASED_OUTPATIENT_CLINIC_OR_DEPARTMENT_OTHER): Payer: Medicare Other | Admitting: Oncology

## 2018-10-31 ENCOUNTER — Other Ambulatory Visit: Payer: Self-pay

## 2018-10-31 VITALS — BP 119/96 | HR 82 | Temp 97.8°F | Resp 18 | Ht 74.0 in | Wt 187.6 lb

## 2018-10-31 DIAGNOSIS — C8599 Non-Hodgkin lymphoma, unspecified, extranodal and solid organ sites: Secondary | ICD-10-CM

## 2018-10-31 DIAGNOSIS — C8330 Diffuse large B-cell lymphoma, unspecified site: Secondary | ICD-10-CM | POA: Diagnosis not present

## 2018-11-04 ENCOUNTER — Other Ambulatory Visit: Payer: Self-pay | Admitting: Oncology

## 2018-11-05 ENCOUNTER — Telehealth: Payer: Self-pay | Admitting: Oncology

## 2018-11-05 ENCOUNTER — Other Ambulatory Visit (HOSPITAL_COMMUNITY): Payer: Self-pay | Admitting: Surgery

## 2018-11-05 NOTE — Telephone Encounter (Signed)
I talk with patient regarding schedule  

## 2018-11-06 ENCOUNTER — Telehealth: Payer: Self-pay | Admitting: *Deleted

## 2018-11-06 ENCOUNTER — Telehealth: Payer: Self-pay | Admitting: Oncology

## 2018-11-06 NOTE — Telephone Encounter (Signed)
This RN received VM from pt's wife wanting to follow up per Dr Pollie Friar visit and discussion of lymph node biopsy which may not give guarantee a diagnosis.  " will the treatment Dr Jana Hakim is suggesting work for other diffuse lymphoma's .  " can Dr Jana Hakim confer with a lymphoma specialist due to Callaghan's presentation and get their input ?"  Return call number given as [phone].  This note will be given to MD.

## 2018-11-06 NOTE — Telephone Encounter (Signed)
Called patient regarding infusion log, informed patient appointments time on 09/09 has been changed. °

## 2018-11-07 ENCOUNTER — Other Ambulatory Visit: Payer: Self-pay | Admitting: Oncology

## 2018-11-07 NOTE — Progress Notes (Unsigned)
Discussed questions with pt's wife [name]. They do not want a 2d opinion but would like me to "curbside" someone who "does a lot of this type of cancer." I will be glad to do that once we have the final path from the upcoming procedure

## 2018-11-13 NOTE — Patient Instructions (Addendum)
DUE TO COVID-19 ONLY ONE VISITOR IS ALLOWED TO COME WITH YOU AND STAY IN THE WAITING ROOM ONLY DURING PRE OP AND PROCEDURE DAY OF SURGERY. THE 1 VISITOR MAY VISIT WITH YOU AFTER SURGERY IN YOUR PRIVATE ROOM DURING VISITING HOURS ONLY!  YOU NEED TO HAVE A COVID 19 TEST ON__9/4_____ @__9 :55_____, THIS TEST MUST BE DONE BEFORE SURGERY, COME  [address], [place] , [address].  ([place]) ONCE YOUR COVID TEST IS COMPLETED, PLEASE BEGIN THE QUARANTINE INSTRUCTIONS AS OUTLINED IN YOUR HANDOUT.                Sena Hitch    Your procedure is scheduled on: 11/18/18   Report to [facility] Main  Entrance   Report to admitting at  1:00 PM     Call this number if you have problems the morning of surgery [phone]    Remember: Do not eat food or drink liquids :After Midnight.  BRUSH YOUR TEETH MORNING OF SURGERY AND RINSE YOUR MOUTH OUT, NO CHEWING GUM CANDY OR MINTS.     Take these medicines the morning of surgery with A SIP OF WATER: none                                 You may not have any metal on your body including piercings              Do not wear jewelry, , lotions, powders or , deodorant                          Men may shave face and neck.   Do not bring valuables to the hospital. [place].  Contacts, dentures or bridgework may not be worn into surgery.       Patients discharged the day of surgery will not be allowed to drive home.  IF YOU ARE HAVING SURGERY AND GOING HOME THE SAME DAY, YOU MUST HAVE AN ADULT TO DRIVE YOU HOME AND BE WITH YOU FOR 24 HOURS.  YOU MAY GO HOME BY TAXI OR UBER OR ORTHERWISE, BUT AN ADULT MUST ACCOMPANY YOU HOME AND STAY WITH YOU FOR 24 HOURS.  Name and phone number of your driver:  Special Instructions: N/A              Please read over the following fact sheets you were given: _____________________________________________________________________              [facility] - Preparing for Surgery  Before surgery, you can play an important role.   Because skin is not sterile, your skin needs to be as free of germs as possible.   You can reduce the number of germs on your skin by washing with CHG (chlorahexidine gluconate) soap before surgery.   CHG is an antiseptic cleaner which kills germs and bonds with the skin to continue killing germs even after washing. Please DO NOT use if you have an allergy to CHG or antibacterial soaps.   If your skin becomes reddened/irritated stop using the CHG and inform your nurse when you arrive at Short Stay.   You may shave your face/neck. Please follow these instructions carefully:  1.  Shower with CHG Soap the night before surgery and the  morning of  Surgery.  2.  If you choose to wash your hair, wash your hair first as usual with your  normal  shampoo.  3.  After you shampoo, rinse your hair and body thoroughly to remove the  shampoo.                                        4.  Use CHG as you would any other liquid soap.  You can apply chg directly  to the skin and wash                       Gently with a scrungie or clean washcloth.  5.  Apply the CHG Soap to your body ONLY FROM THE NECK DOWN.   Do not use on face/ open                           Wound or open sores. Avoid contact with eyes, ears mouth and genitals (private parts).                       Wash face,  Genitals (private parts) with your normal soap.             6.  Wash thoroughly, paying special attention to the area where your surgery  will be performed.  7.  Thoroughly rinse your body with warm water from the neck down.  8.  DO NOT shower/wash with your normal soap after using and rinsing off  the CHG Soap.             9.  Pat yourself dry with a clean towel.            10.  Wear clean pajamas.            11.  Place clean sheets on your bed the night of your first shower and do not  sleep with pets. Day of Surgery : Do not apply any  lotions/deodorants the morning of surgery.  Please wear clean clothes to the hospital/surgery center.  FAILURE TO FOLLOW THESE INSTRUCTIONS MAY RESULT IN THE CANCELLATION OF YOUR SURGERY PATIENT SIGNATURE_________________________________  NURSE SIGNATURE__________________________________  ________________________________________________________________________

## 2018-11-14 ENCOUNTER — Encounter (HOSPITAL_COMMUNITY): Payer: Self-pay

## 2018-11-14 ENCOUNTER — Other Ambulatory Visit (HOSPITAL_COMMUNITY)
Admission: RE | Admit: 2018-11-14 | Discharge: 2018-11-14 | Disposition: A | Discharge status: Home / Self Care | Payer: Medicare Other | Source: Ambulatory Visit | Attending: Surgery | Admitting: Surgery

## 2018-11-14 ENCOUNTER — Encounter (HOSPITAL_COMMUNITY)
Admission: RE | Admit: 2018-11-14 | Discharge: 2018-11-14 | Disposition: A | Discharge status: Home / Self Care | Payer: Medicare Other | Source: Ambulatory Visit | Attending: Surgery | Admitting: Surgery

## 2018-11-14 ENCOUNTER — Other Ambulatory Visit: Payer: Self-pay

## 2018-11-14 DIAGNOSIS — C8594 Non-Hodgkin lymphoma, unspecified, lymph nodes of axilla and upper limb: Secondary | ICD-10-CM | POA: Insufficient documentation

## 2018-11-14 DIAGNOSIS — Z01812 Encounter for preprocedural laboratory examination: Secondary | ICD-10-CM | POA: Diagnosis present

## 2018-11-14 DIAGNOSIS — Z20828 Contact with and (suspected) exposure to other viral communicable diseases: Secondary | ICD-10-CM | POA: Insufficient documentation

## 2018-11-14 LAB — BASIC METABOLIC PANEL
Anion gap: 6 (ref 5–15)
BUN: 14 mg/dL (ref 8–23)
CO2: 27 mmol/L (ref 22–32)
Calcium: 8.8 mg/dL — ABNORMAL LOW (ref 8.9–10.3)
Chloride: 107 mmol/L (ref 98–111)
Creatinine, Ser: 0.79 mg/dL (ref 0.61–1.24)
GFR calc Af Amer: >60 mL/min (ref >60)
GFR calc non Af Amer: >60 mL/min (ref >60)
Glucose, Bld: 110 mg/dL — ABNORMAL HIGH (ref 70–99)
Potassium: 4.4 mmol/L (ref 3.5–5.1)
Sodium: 140 mmol/L (ref 135–145)

## 2018-11-14 LAB — CBC
HCT: 45 % (ref 39.0–52.0)
Hemoglobin: 14.4 g/dL (ref 13.0–17.0)
MCH: 30.8 pg (ref 26.0–34.0)
MCHC: 32 g/dL (ref 30.0–36.0)
MCV: 96.4 fL (ref 80.0–100.0)
Platelets: 189 10*3/uL (ref 150–400)
RBC: 4.67 MIL/uL (ref 4.22–5.81)
RDW: 14.1 % (ref 11.5–15.5)
WBC: 5 10*3/uL (ref 4.0–10.5)
nRBC: 0 % (ref 0.0–0.2)

## 2018-11-14 NOTE — Progress Notes (Addendum)
PCP - Dr. Carney Living Cardiologist - Dr. Debara Pickett.   Chest x-ray - no EKG - no Stress Test - no ECHO - 01/03/18 Cardiac Cath -   Sleep Study - no CPAP - no  Fasting Blood Sugar - NA Checks Blood Sugar _____ times a day  Blood Thinner Instructions:NA Aspirin Instructions: Last Dose:  Anesthesia review:   Patient denies shortness of breath, fever, cough and chest pain at PAT appointment yes  Patient verbalized understanding of instructions that were given to them at the PAT appointment. Patient was also instructed that they will need to review over the PAT instructions again at home before surgery. Yes ECHO was done while pt was being worked up prior to Dx of CA

## 2018-11-14 NOTE — H&P (Signed)
Michael Allen  Location: [facility] Patient #: [id number] DOB: 08-02-1945 Married / Language: English / Race: White Male  History of Present Illness   The patient is a 73 year old male who presents with a complaint of lymphoma.  The PCP is Dr. Carney Living.  The patient was referred by Dr. Shelda Pal He comes by himself.  [The Covid-19 virus has disrupted normal medical care in [place] and across the nation. We have sometimes had to alter normal surgical/medical care to limit this epidemic and we have explained these changes to the patient.]  The patient had a kidney stone attack last year. This prompted him to see an urologist. This then prompted a left orchiectomy for a mass in his left testicle. The mass proved to be lymphoma. He underwent treatment by Dr. Jana Hakim. He's never had any abdominal surgery. He thinks he had a right inguinal hernia repair in 2002 by Dr. Dalbert Batman (?). And he had a left orchiectomy last year. He has no history of stomach, liver, or colon disease. He had a PET scan for follow up of a lymphoma on 10/27/2018. The PET scan showed:  1. Intense hypermetabolism associated with a new 2.2 x 1.1 cm posterior left mesenteric lymph node, compatible with recurrent lymphoma. Deauville score 5. 2. Nonspecific right testicular hypermetabolism without discrete right testicular mass on the CT images, mildly increased from prior PET-CT. Findings are most likely physiologic, although involvement of the right testicle by lymphoma is difficult to exclude. 3. No additional potential sites of hypermetabolic lymphoma. 4. Aortic Atherosclerosis (ICD10-I70.0).  I discussed the options for tried to biopsy this lymph node. The lymph node is enlarged compared to other lymph nodes, but not overwhelmingly enlarged. I yesterday about a 50% chance that I could find the lymph node and about a 50% chance that  I come up empty handed. I would only do this laparoscopically. The main risk would be infection, bleeding, injury to the blood supply of the bowel, and nerve injury. If the lymph node cannot be found, he will get treated. At the lymph node is positive, then depending on the pathology will dictate the treatment. At the lymph node is negative, but the lip tissue is abnormal and Dr. Jana Hakim would probably not treat him. But the lymph node is negative, and normal tissue, Dr. Jana Hakim was seen by missed the lymph node and treat him.  I called Mr. Hoglund, and his wife back after speaking with Dr. Jana Hakim and answered questions. We'll go ahead and schedule the surgery.  Plan: 1) Schedule laparoscopic exploration for biopsy of mesenteric lymph node.  Review of Systems as stated in this history (HPI) or in the review of systems. Otherwise all other 12 point ROS are negative  Past Medical History [note: He brought a detailed H&P that I will scan into [employer]] 1. Jehovah's Witness 2. History of diffuse large cell non-Hodgkin's lymphoma, B-cell subtype His original tumor was found at left orchiectomy by Dr. [name] on 12/20/2017. 3. History of kidney stones 4. History of spontaneous pneumothoraces - has had bilateral lung operations 5. Floaters in his eyes  Social History: Married, wife [name] He has 2 children: son 50 yo in [place] and daughter 19 in [place] He worked 49 years for the [employer] of [place]   Past Surgical History (April Staton, [place]; 11/05/2018 12:06 PM) Lung Surgery  Bilateral. Oral Surgery  Tonsillectomy   Diagnostic Studies History (April Staton, [place]; 11/05/2018 12:06 PM) Colonoscopy  1-5 years ago  Allergies (April Staton, [place]; 11/05/2018 12:07 PM) Penicillins  Shellfish-derived Products  Allergies Reconciled   Social History (April Staton, CMA; 11/05/2018 12:06 PM) Caffeine use  Coffee. No alcohol use  No drug use  Tobacco use   Former smoker.  Family History (April Staton, [place]; 11/05/2018 12:06 PM) Breast Cancer  Mother, Sister.  Other Problems (April Staton, [place]; 11/05/2018 12:06 PM) Cancer  Hemorrhoids  Inguinal Hernia  Kidney Stone     Review of Systems (April Staton CMA; 11/05/2018 12:06 PM) General Not Present- Appetite Loss, Chills, Fatigue, Fever, Night Sweats, Weight Gain and Weight Loss. Skin Not Present- Change in Wart/Mole, Dryness, Hives, Jaundice, New Lesions, Non-Healing Wounds, Rash and Ulcer. HEENT Not Present- Earache, Hearing Loss, Hoarseness, Nose Bleed, Oral Ulcers, Ringing in the Ears, Seasonal Allergies, Sinus Pain, Sore Throat, Visual Disturbances, Wears glasses/contact lenses and Yellow Eyes. Respiratory Not Present- Bloody sputum, Chronic Cough, Difficulty Breathing, Snoring and Wheezing. Breast Not Present- Breast Mass, Breast Pain, Nipple Discharge and Skin Changes. Cardiovascular Not Present- Chest Pain, Difficulty Breathing Lying Down, Leg Cramps, Palpitations, Rapid Heart Rate, Shortness of Breath and Swelling of Extremities. Gastrointestinal Not Present- Abdominal Pain, Bloating, Bloody Stool, Change in Bowel Habits, Chronic diarrhea, Constipation, Difficulty Swallowing, Excessive gas, Gets full quickly at meals, Hemorrhoids, Indigestion, Nausea, Rectal Pain and Vomiting. Male Genitourinary Not Present- Blood in Urine, Change in Urinary Stream, Frequency, Impotence, Nocturia, Painful Urination, Urgency and Urine Leakage.  Vitals (April Staton CMA; 11/05/2018 12:07 PM) 11/05/2018 12:07 PM Weight: 187.5 lb Height: 74in Body Surface Area: 2.11 m Body Mass Index: 24.07 kg/m  Pulse: 82 (Regular)  BP: 100/72(Sitting, Left Arm, Standard)   Physical Exam  General: Tall average weight WM who isalert and generally healthy appearing. He is wearing a mask. Skin: Inspection and palpation of the skin unremarkable.  Eyes: Conjunctivae white, pupils equal. Face, ears,  nose, mouth, and throat: Wearing a mask.  Neck: Supple. No mass. Trachea midline. No thyroid mass.  Lymph Nodes: No supraclavicular or cervical adenopathy. No axillary adenopathy. No inguinal adenopathy.  Lungs: Normal respiratory effort. Clear to auscultation and symmetric breath sounds. He has bilateral lateral chest scars. Cardiovascular: Regular rate and rythm. Normal auscultation of the heart. No murmur or rub.  Abdomen: Soft. No mass. Liver and spleen not palpable. No tenderness. No hernia. Normal bowel sounds.   Left inguinal scar. Absent left testicle. I don't see an obvious right inguinal scar. Rectal: Not done.  Musculoskeletal/extremities: Normal gait. Good strength and ROM in upper and lower extremities.   Neurologic: Grossly intact to motor and sensory function.   Psychiatric: Has normal mood and affect. Judgement and insight appear normal.    Assessment & Plan  1.  NON-HODGKIN'S LYMPHOMA (C85.90)  Story: Diffuse large cell non-Hodgkin's lymphoma, B-cell subtype - left orchiectomy (Dr. [name]) - 12/20/2017  Plan:  1) Reviewed with Dr. Jana Hakim  2) Plan would be laparoscopic exploration for mesenteric lymph node excision - he understands that there is a chance that I will not be able to find the node.  2. Jehovah's Witness 3. History of kidney stones 4. History of spontaneous pneumothoraces - has had bilateral lung operations (pleurodesis)   Alphonsa Overall, MD, [facility] Pager: [phone] Office phone:  [phone]

## 2018-11-15 LAB — NOVEL CORONAVIRUS, NAA (HOSP ORDER, SEND-OUT TO REF LAB; TAT 18-24 HRS): SARS-CoV-2, NAA: NOT DETECTED

## 2018-11-17 NOTE — Anesthesia Preprocedure Evaluation (Addendum)
Anesthesia Evaluation  Patient identified by MRN, date of birth, ID band Patient awake    Reviewed: Allergy & Precautions, NPO status , Patient's Chart, lab work & pertinent test results  History of Anesthesia Complications (+) PONVNegative for: history of anesthetic complications  Airway Mallampati: II  TM Distance: >3 FB Neck ROM: Full    Dental no notable dental hx. (+) Dental Advisory Given   Pulmonary former smoker,    Pulmonary exam normal        Cardiovascular Exercise Tolerance: Good Normal cardiovascular exam  01/03/2018 Echo  Left ventricle: The cavity size was normal. Wall thickness was   increased in a pattern of mild LVH. Systolic function was normal.   The estimated ejection fraction was in the range of 60% to 65%.   Normal GLS at -18%. Wall motion was normal; there were no   regional wall motion abnormalities. Doppler parameters are   consistent with abnormal left ventricular relaxation (grade 1   diastolic dysfunction). The E/e&' ratio is <8, suggesting normal   LV filling pressure.   Neuro/Psych  Headaches, negative psych ROS   GI/Hepatic Neg liver ROS, GERD  ,  Endo/Other  negative endocrine ROS  Renal/GU negative Renal ROSK+ 4.4 Cr 0.79     Musculoskeletal negative musculoskeletal ROS (+)   Abdominal   Peds  Hematology  (+) JEHOVAH'S WITNESSHgb 14.4   Anesthesia Other Findings Alkl: PCN, oxycodone?  Reproductive/Obstetrics                           Lab Results  Component Value Date   WBC 5.0 11/14/2018   HGB 14.4 11/14/2018   HCT 45.0 11/14/2018   MCV 96.4 11/14/2018   PLT 189 11/14/2018   Lab Results  Component Value Date   CREATININE 0.79 11/14/2018   BUN 14 11/14/2018   NA 140 11/14/2018   K 4.4 11/14/2018   CL 107 11/14/2018   CO2 27 11/14/2018      Anesthesia Physical Anesthesia Plan  ASA: III  Anesthesia Plan: General   Post-op Pain  Management:    Induction: Intravenous  PONV Risk Score and Plan: 3 and Treatment may vary due to age or medical condition, Ondansetron and Dexamethasone  Airway Management Planned: Oral ETT  Additional Equipment: None  Intra-op Plan:   Post-operative Plan: Extubation in OR  Informed Consent: I have reviewed the patients History and Physical, chart, labs and discussed the procedure including the risks, benefits and alternatives for the proposed anesthesia with the patient or authorized representative who has indicated his/her understanding and acceptance.     Dental advisory given  Plan Discussed with:   Anesthesia Plan Comments: (Lapascopic exploration (non hodgekins) GA )       Anesthesia Quick Evaluation

## 2018-11-18 ENCOUNTER — Inpatient Hospital Stay: Payer: Medicare Other

## 2018-11-18 ENCOUNTER — Ambulatory Visit (HOSPITAL_COMMUNITY): Payer: Medicare Other | Admitting: Physician Assistant

## 2018-11-18 ENCOUNTER — Inpatient Hospital Stay: Payer: Medicare Other | Admitting: Oncology

## 2018-11-18 ENCOUNTER — Telehealth: Payer: Self-pay | Admitting: Oncology

## 2018-11-18 ENCOUNTER — Ambulatory Visit (HOSPITAL_COMMUNITY): Payer: Medicare Other | Admitting: Anesthesiology

## 2018-11-18 ENCOUNTER — Encounter (HOSPITAL_COMMUNITY): Payer: Self-pay | Admitting: Certified Registered Nurse Anesthetist

## 2018-11-18 ENCOUNTER — Other Ambulatory Visit: Payer: Self-pay

## 2018-11-18 ENCOUNTER — Observation Stay (HOSPITAL_COMMUNITY)
Admission: RE | Admit: 2018-11-18 | Discharge: 2018-11-19 | Disposition: A | Discharge status: Home / Self Care | Payer: Medicare Other | Attending: Surgery | Admitting: Surgery

## 2018-11-18 ENCOUNTER — Encounter (HOSPITAL_COMMUNITY)
Admission: RE | Disposition: A | Discharge status: Home / Self Care | Payer: Self-pay | Source: Home / Self Care | Attending: Surgery

## 2018-11-18 ENCOUNTER — Other Ambulatory Visit: Payer: Self-pay | Admitting: Oncology

## 2018-11-18 DIAGNOSIS — Z8572 Personal history of non-Hodgkin lymphomas: Secondary | ICD-10-CM | POA: Insufficient documentation

## 2018-11-18 DIAGNOSIS — Z87442 Personal history of urinary calculi: Secondary | ICD-10-CM | POA: Insufficient documentation

## 2018-11-18 DIAGNOSIS — Z87891 Personal history of nicotine dependence: Secondary | ICD-10-CM | POA: Insufficient documentation

## 2018-11-18 DIAGNOSIS — R590 Localized enlarged lymph nodes: Secondary | ICD-10-CM | POA: Diagnosis not present

## 2018-11-18 DIAGNOSIS — Q430 Meckel's diverticulum (displaced) (hypertrophic): Secondary | ICD-10-CM | POA: Insufficient documentation

## 2018-11-18 DIAGNOSIS — Z885 Allergy status to narcotic agent status: Secondary | ICD-10-CM | POA: Insufficient documentation

## 2018-11-18 DIAGNOSIS — K4090 Unilateral inguinal hernia, without obstruction or gangrene, not specified as recurrent: Secondary | ICD-10-CM | POA: Insufficient documentation

## 2018-11-18 DIAGNOSIS — Z880 Allergy status to penicillin: Secondary | ICD-10-CM | POA: Diagnosis not present

## 2018-11-18 DIAGNOSIS — Z91013 Allergy to seafood: Secondary | ICD-10-CM | POA: Diagnosis not present

## 2018-11-18 HISTORY — PX: LAPAROSCOPIC REMOVAL ABDOMINAL MASS: SHX6360

## 2018-11-18 SURGERY — REMOVAL, MASS, ABDOMEN, LAPAROSCOPIC
Anesthesia: General | Site: Abdomen

## 2018-11-18 MED ORDER — LACTATED RINGERS IV SOLN
INTRAVENOUS | Status: DC
  Administered 2018-11-18 (×2): via INTRAVENOUS

## 2018-11-18 MED ORDER — ACETAMINOPHEN 10 MG/ML IV SOLN: 1000.0000 mg | Freq: Once | INTRAVENOUS | Status: DC | PRN

## 2018-11-18 MED ORDER — 0.9 % SODIUM CHLORIDE (POUR BTL) OPTIME
TOPICAL | Status: DC | PRN
  Administered 2018-11-18: 16:00:00 1000 mL

## 2018-11-18 MED ORDER — BUPIVACAINE LIPOSOME 1.3 % IJ SUSP
20.0000 mL | Freq: Once | INTRAMUSCULAR | Status: AC
  Administered 2018-11-18: 20 mL
  Filled 2018-11-18: qty 20

## 2018-11-18 MED ORDER — SUGAMMADEX SODIUM 200 MG/2ML IV SOLN
INTRAVENOUS | Status: DC | PRN
  Administered 2018-11-18: 200 mg via INTRAVENOUS

## 2018-11-18 MED ORDER — FENTANYL CITRATE (PF) 100 MCG/2ML IJ SOLN: 25.0000 ug | INTRAMUSCULAR | Status: DC | PRN

## 2018-11-18 MED ORDER — ONDANSETRON 4 MG PO TBDP: 4.0000 mg | ORAL_TABLET | Freq: Four times a day (QID) | ORAL | Status: DC | PRN

## 2018-11-18 MED ORDER — PHENYLEPHRINE 40 MCG/ML (10ML) SYRINGE FOR IV PUSH (FOR BLOOD PRESSURE SUPPORT)
PREFILLED_SYRINGE | INTRAVENOUS | Status: DC | PRN
  Administered 2018-11-18 (×2): 120 ug via INTRAVENOUS
  Administered 2018-11-18 (×2): 80 ug via INTRAVENOUS
  Administered 2018-11-18: 120 ug via INTRAVENOUS

## 2018-11-18 MED ORDER — CHLORHEXIDINE GLUCONATE CLOTH 2 % EX PADS: 6.0000 | MEDICATED_PAD | Freq: Once | CUTANEOUS | Status: DC

## 2018-11-18 MED ORDER — ONDANSETRON HCL 4 MG/2ML IJ SOLN: 4.0000 mg | Freq: Four times a day (QID) | INTRAMUSCULAR | Status: DC | PRN

## 2018-11-18 MED ORDER — FENTANYL CITRATE (PF) 250 MCG/5ML IJ SOLN
INTRAMUSCULAR | Status: AC
  Filled 2018-11-18: qty 5

## 2018-11-18 MED ORDER — LIDOCAINE 2% (20 MG/ML) 5 ML SYRINGE
INTRAMUSCULAR | Status: DC | PRN
  Administered 2018-11-18: 100 mg via INTRAVENOUS

## 2018-11-18 MED ORDER — KCL IN DEXTROSE-NACL 20-5-0.45 MEQ/L-%-% IV SOLN
INTRAVENOUS | Status: DC
  Administered 2018-11-18: 19:00:00 via INTRAVENOUS
  Filled 2018-11-18: qty 1000

## 2018-11-18 MED ORDER — PROPOFOL 10 MG/ML IV BOLUS
INTRAVENOUS | Status: DC | PRN
  Administered 2018-11-18: 140 mg via INTRAVENOUS

## 2018-11-18 MED ORDER — ACETAMINOPHEN 500 MG PO TABS
1000.0000 mg | ORAL_TABLET | ORAL | Status: AC
  Administered 2018-11-18: 1000 mg via ORAL
  Filled 2018-11-18: qty 2

## 2018-11-18 MED ORDER — HYDROCODONE-ACETAMINOPHEN 5-325 MG PO TABS: 1.0000 | ORAL_TABLET | ORAL | Status: DC | PRN

## 2018-11-18 MED ORDER — MORPHINE SULFATE (PF) 4 MG/ML IV SOLN: 1.0000 mg | INTRAVENOUS | Status: DC | PRN

## 2018-11-18 MED ORDER — DEXAMETHASONE SODIUM PHOSPHATE 10 MG/ML IJ SOLN
INTRAMUSCULAR | Status: DC | PRN
  Administered 2018-11-18: 5 mg via INTRAVENOUS

## 2018-11-18 MED ORDER — ONDANSETRON HCL 4 MG/2ML IJ SOLN
INTRAMUSCULAR | Status: DC | PRN
  Administered 2018-11-18: 4 mg via INTRAVENOUS

## 2018-11-18 MED ORDER — BUPIVACAINE HCL (PF) 0.25 % IJ SOLN
INTRAMUSCULAR | Status: DC | PRN
  Administered 2018-11-18: 30 mL

## 2018-11-18 MED ORDER — ROCURONIUM BROMIDE 10 MG/ML (PF) SYRINGE
PREFILLED_SYRINGE | INTRAVENOUS | Status: DC | PRN
  Administered 2018-11-18: 40 mg via INTRAVENOUS

## 2018-11-18 MED ORDER — GABAPENTIN 300 MG PO CAPS
300.0000 mg | ORAL_CAPSULE | ORAL | Status: AC
  Administered 2018-11-18: 300 mg via ORAL
  Filled 2018-11-18: qty 1

## 2018-11-18 MED ORDER — BUPIVACAINE HCL (PF) 0.25 % IJ SOLN
INTRAMUSCULAR | Status: AC
  Filled 2018-11-18: qty 30

## 2018-11-18 MED ORDER — LACTATED RINGERS IV SOLN
INTRAVENOUS | Status: AC | PRN
  Administered 2018-11-18: 1000 mL

## 2018-11-18 MED ORDER — ONDANSETRON HCL 4 MG/2ML IJ SOLN: 4.0000 mg | Freq: Once | INTRAMUSCULAR | Status: DC | PRN

## 2018-11-18 MED ORDER — TRAMADOL HCL 50 MG PO TABS: 50.0000 mg | ORAL_TABLET | Freq: Four times a day (QID) | ORAL | Status: DC | PRN

## 2018-11-18 MED ORDER — FENTANYL CITRATE (PF) 100 MCG/2ML IJ SOLN
INTRAMUSCULAR | Status: DC | PRN
  Administered 2018-11-18: 100 ug via INTRAVENOUS
  Administered 2018-11-18: 50 ug via INTRAVENOUS

## 2018-11-18 MED ORDER — SUCCINYLCHOLINE CHLORIDE 20 MG/ML IJ SOLN
INTRAMUSCULAR | Status: DC | PRN
  Administered 2018-11-18: 80 mg via INTRAVENOUS

## 2018-11-18 MED ORDER — PROPOFOL 10 MG/ML IV BOLUS
INTRAVENOUS | Status: AC
  Filled 2018-11-18: qty 20

## 2018-11-18 SURGICAL SUPPLY — 31 items
BENZOIN TINCTURE PRP APPL 2/3 (GAUZE/BANDAGES/DRESSINGS) IMPLANT
CABLE HIGH FREQUENCY MONO STRZ (ELECTRODE) IMPLANT
CHLORAPREP W/TINT 26 (MISCELLANEOUS) ×2 IMPLANT
COVER SURGICAL LIGHT HANDLE (MISCELLANEOUS) ×2 IMPLANT
COVER WAND RF STERILE (DRAPES) IMPLANT
DECANTER SPIKE VIAL GLASS SM (MISCELLANEOUS) IMPLANT
DERMABOND ADVANCED (GAUZE/BANDAGES/DRESSINGS)
DERMABOND ADVANCED .7 DNX12 (GAUZE/BANDAGES/DRESSINGS) IMPLANT
ELECT REM PT RETURN 15FT ADLT (MISCELLANEOUS) ×2 IMPLANT
GLOVE SURG SYN 7.5  E (GLOVE) ×1
GLOVE SURG SYN 7.5 E (GLOVE) ×1 IMPLANT
GLOVE SURG SYN 7.5 PF PI (GLOVE) ×1 IMPLANT
GOWN STRL REUS W/TWL XL LVL3 (GOWN DISPOSABLE) ×6 IMPLANT
HEMOSTAT SNOW SURGICEL 2X4 (HEMOSTASIS) ×1 IMPLANT
IRRIG SUCT STRYKERFLOW 2 WTIP (MISCELLANEOUS)
IRRIGATION SUCT STRKRFLW 2 WTP (MISCELLANEOUS) IMPLANT
KIT BASIN OR (CUSTOM PROCEDURE TRAY) ×2 IMPLANT
KIT TURNOVER KIT A (KITS) IMPLANT
POUCH RETRIEVAL ECOSAC 10 (ENDOMECHANICALS) IMPLANT
POUCH RETRIEVAL ECOSAC 10MM (ENDOMECHANICALS) ×1
SHEARS HARMONIC ACE PLUS 36CM (ENDOMECHANICALS) ×1 IMPLANT
SLEEVE XCEL OPT CAN 5 100 (ENDOMECHANICALS) ×4 IMPLANT
STRIP CLOSURE SKIN 1/2X4 (GAUZE/BANDAGES/DRESSINGS) IMPLANT
SUT MNCRL AB 4-0 PS2 18 (SUTURE) ×2 IMPLANT
TOWEL OR 17X26 10 PK STRL BLUE (TOWEL DISPOSABLE) ×2 IMPLANT
TOWEL OR NON WOVEN STRL DISP B (DISPOSABLE) ×2 IMPLANT
TRAY FOLEY MTR SLVR 16FR STAT (SET/KITS/TRAYS/PACK) IMPLANT
TRAY LAPAROSCOPIC (CUSTOM PROCEDURE TRAY) ×2 IMPLANT
TROCAR BLADELESS OPT 5 100 (ENDOMECHANICALS) ×2 IMPLANT
TROCAR XCEL BLUNT TIP 100MML (ENDOMECHANICALS) IMPLANT
TROCAR XCEL NON-BLD 11X100MML (ENDOMECHANICALS) ×1 IMPLANT

## 2018-11-18 NOTE — Op Note (Addendum)
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  Terrence DupontRockne Coons, M.D.  ANESTHESIA:   general  Anesthesiologist: Barnet Glasgow, MD CRNA: Gean Maidens, CRNA  General  EBL:  minimal  ml  BLOOD ADMINISTERED: none  DRAINS: none   LOCAL MEDICATIONS USED:   42 cc of local - Exparel (20 cc) and 1/4% Marcaine (30 cc)  SPECIMEN:   Mesenteric tissue/node - discussed with Dr. Tresa Moore  COUNTS CORRECT:  YES  INDICATIONS FOR PROCEDURE:  Michael Allen is a 73 y.o. (DOB: 1945-07-30) white male whose primary care physician is Seward Carol, MD and comes for laparoscopic exploration and biopsy of mesenteric node.   The patient had lymphoma diagnosed on a left orchiectomy on 12/28/2017 by Dr. [name].    A recent CT scan suggests that he has recurrent tumor in his small bowel mesentery, anterior to the left kidney.  He comes for an attempted biopsy of this area of his small bowel.   The indications and risks of the surgery were explained to the patient.  The risks include, but are not limited to, infection, bleeding, and nerve injury.  PROCEDURE: The patient was taken to room #4 at [facility].  His abdomen was prepped with ChloraPrep, both his arms were tucked, and he was given a general anesthetic.  A timeout was held the surgical checklist run.  I accessed his abdominal cavity with a 5 mm trocar in his left upper quadrant.  I placed 3 additional trochars: A 5 mm trocar in his left lower quadrant, a 5 mm trocar  in his right lower quadrant, and a 5 mm trocar in his right midabdomen.  An abdominal exploration was carried out.  Both lobes of his liver, his gallbladder, and his anterior stomach were all normal.  Towards his pelvis, he had evidence of a laparoscopic hernia repair in the left groin that looked in good condition.  He had a small indirect right inguinal hernia.  He had omentum adhesed to the anterior peritoneal surface.  This came from the right transverse colon and right colon, and I took this down with harmonic scalpel.  I ran his small bowel.  He had a Meckel's diverticula, which otherwise looked unremarkable.  Of significance, he had a thickened mesentery of his proximal small bowel and whitish changes consistent with mesenteric lymphatic obstruction.  These mesenteric changes were just beyond the ligament of Tritz.  The small bowel looked grossly normal.  This involved an area of approximately 6 x 9 cm.  I found a hard nodule about 2 cm x 2 cm with neovascularization that I excised and sent to pathology.  I excised this with the harmonic scapel, lost little blood, and did not appear to compromise the small bowel.  I placed some Surgicel Snow absorbable hemostat over the open wound in the mesentery.   It is unclear whether this is an actual lymph node, scar tissue, or some other reaction within the mesentery.  I talked to Dr. Vicente Males on the phone about the tissue and the possible lymphoma.  I thought any further tissue dissection would only carry an increased risk of small bowel injury or bleeding, and I had removed the most suspicious tissue that I found.  This location also correlated well with the findings on the PET scan.  Outside the area of the small bowel mesentery, I found no other worrisome pathology.  I have a surgeon as a first assist to retract, expose, and assist on this difficult operation.  I irrigated the wound with about 300 cc of saline.  I placed a TAP block and circum trocar  block with 42 cc of local anesthetic.   I removed the trochars in turn.  I closed the skin with interrupted 4-0 Monocryl suture.   Left upper photo - view of pelvis Right upper photo - indirect right inguinal hernia Left lower photo - Meckel's diverticula Right lower photo - chylous changes in thickened mesentery   Left upper photo - mass with neovascularizaton in small bowel mesentery (this is my site of biopsy) Right upper photo - mesentery after mass removed   Alphonsa Overall, MD, [facility] Pager: [phone] Office phone:  [phone]

## 2018-11-18 NOTE — Telephone Encounter (Signed)
Called patient regarding upcoming appointment on 09/15, left a voicemail and calender will be mailed.

## 2018-11-18 NOTE — Anesthesia Postprocedure Evaluation (Signed)
Anesthesia Post Note  Patient: Michael Allen  Procedure(s) Performed: LAPAROSCOPIC EXPLORATION FOR MESENTERIC LYMPH NODE (N/A Abdomen)     Patient location during evaluation: PACU Anesthesia Type: General Level of consciousness: awake and alert Pain management: pain level controlled Vital Signs Assessment: post-procedure vital signs reviewed and stable Respiratory status: spontaneous breathing, nonlabored ventilation, respiratory function stable and patient connected to nasal cannula oxygen Cardiovascular status: blood pressure returned to baseline and stable Postop Assessment: no apparent nausea or vomiting Anesthetic complications: no    Last Vitals:  Vitals:   11/18/18 1715 11/18/18 1730  BP: 117/74 111/72  Pulse: 64 62  Resp: 15 16  Temp:  36.5 C  SpO2: 100% 100%    Last Pain:  Vitals:   11/18/18 1730  TempSrc:   PainSc: 0-No pain                 Barnet Glasgow

## 2018-11-18 NOTE — Telephone Encounter (Signed)
Cancelled all existing appointments per providers request and added a follow up appointment approved by provider.

## 2018-11-18 NOTE — Anesthesia Procedure Notes (Signed)
Procedure Name: Intubation Performed by: Gean Maidens, CRNA Pre-anesthesia Checklist: Patient identified, Emergency Drugs available, Suction available, Patient being monitored and Timeout performed Patient Re-evaluated:Patient Re-evaluated prior to induction Oxygen Delivery Method: Circle system utilized Preoxygenation: Pre-oxygenation with 100% oxygen Induction Type: IV induction Ventilation: Mask ventilation without difficulty Laryngoscope Size: Mac and 3 Grade View: Grade I Tube type: Oral Tube size: 7.5 mm Number of attempts: 1 Airway Equipment and Method: Stylet Placement Confirmation: ETT inserted through vocal cords under direct vision,  positive ETCO2 and breath sounds checked- equal and bilateral Secured at: 23 cm Tube secured with: Tape Dental Injury: Teeth and Oropharynx as per pre-operative assessment

## 2018-11-18 NOTE — Transfer of Care (Signed)
Immediate Anesthesia Transfer of Care Note  Patient: Michael Allen  Procedure(s) Performed: LAPAROSCOPIC EXPLORATION FOR MESENTERIC LYMPH NODE (N/A Abdomen)  Patient Location: PACU  Anesthesia Type:General  Level of Consciousness: awake, alert  and oriented  Airway & Oxygen Therapy: Patient Spontanous Breathing and Patient connected to face mask oxygen  Post-op Assessment: Report given to RN and Post -op Vital signs reviewed and stable  Post vital signs: Reviewed and stable  Last Vitals:  Vitals Value Taken Time  BP 124/68 11/18/18 1650  Temp    Pulse 61 11/18/18 1651  Resp 21 11/18/18 1651  SpO2 100 % 11/18/18 1651  Vitals shown include unvalidated device data.  Last Pain:  Vitals:   11/18/18 1319  TempSrc:   PainSc: 0-No pain         Complications: No apparent anesthesia complications

## 2018-11-19 ENCOUNTER — Ambulatory Visit: Payer: Medicare Other

## 2018-11-19 ENCOUNTER — Encounter (HOSPITAL_COMMUNITY): Payer: Self-pay | Admitting: Surgery

## 2018-11-19 NOTE — Plan of Care (Signed)
Pt was discharged home today. Instructions were reviewed with patient, and questions were answered. Pt was taken to main entrance via wheelchair by NT.  

## 2018-11-19 NOTE — Discharge Instructions (Signed)
[facility] - DISCHARGE INSTRUCTIONS TO PATIENT  Activity:  Driving - If doing well, may drive tomorrow   Lifting - No lifting more than 15 pounds for 10 days, then no limit                       Practice you Covid-19 protection:  Wear a mask, social distance, and wash your hands frequently  Wound Care:   Leave the incision dry until tomorrow, then you may shower  Diet:  As tolerated  Follow up appointment:  Call Dr. Pollie Friar office [facility]) at [phone] for an appointment in 2 to 3 weeks..   Medications and dosages:  Resume your home medications.             You may take tylenol, ibuprofen, or Aleve for pain  Call Dr. Lucia Gaskins or his office  [phone]) if you have:  Temperature greater than 100.4,  Persistent nausea and vomiting,  Severe uncontrolled pain,  Redness, tenderness, or signs of infection (pain, swelling, redness, odor or green/yellow discharge around the site),  Difficulty breathing, headache or visual disturbances,  Any other questions or concerns you may have after discharge.  In an emergency, call 911 or go to an Emergency Department at a nearby hospital.

## 2018-11-19 NOTE — Discharge Summary (Signed)
Physician Discharge Summary  Patient ID:  Michael Allen  MRN: [id number]  DOB/AGE: Mar 11, 1946 73 y.o.  Admit date: 11/18/2018 Discharge date: 11/19/2018  Discharge Diagnoses:  1.  Mesenteric adenopathy  2.  History of non-Hodgkin's lymphoma 3.  Small right inguinal hernia 4.  Meckel's diverticula 5. History of kidney stones 6. History of spontaneous pneumothoraces - has had bilateral lung operations (pleurodesis)   Active Problems:   Lymphadenopathy, mesenteric  Operation: Procedure(s): LAPAROSCOPIC EXPLORATION, biopsy of  MESENTERIC LYMPH NODE on 11/18/2018 - D. Lucia Gaskins  Discharged Condition: good  Hospital Course: JAMESRYAN KRAMMER is an 73 y.o. male whose primary care physician is Seward Carol, MD and who was admitted 11/18/2018 with a chief complaint of mesenteric lymphadenopathy.   He was brought to the operating room on 11/18/2018 and underwent LAPAROSCOPIC EXPLORATION, biopsy of  MESENTERIC LYMPH NODE.   He did well the night of surgery, had little pain, and is tolerating food well. He is ready for discharge.  I got his wife on the phone to go over his discharge plans.  The discharge instructions were reviewed with the patient.  Consults: None  Significant Diagnostic Studies: Results for orders placed or performed during the hospital encounter of 11/14/18  Novel Coronavirus, NAA (Hosp order, Send-out to Ref Lab; TAT 18-24 hrs   Specimen: Nasopharyngeal Swab; Respiratory  Result Value Ref Range   SARS-CoV-2, NAA NOT DETECTED NOT DETECTED   Coronavirus Source NASOPHARYNGEAL     Mr Jeri Cos Wo Contrast  Result Date: 10/27/2018 CLINICAL DATA:  Testicular cancer staging EXAM: MRI HEAD WITHOUT AND WITH CONTRAST TECHNIQUE: Multiplanar, multiecho pulse sequences of the brain and surrounding structures were obtained without and with intravenous contrast. CONTRAST:  8 mL Gadovist IV COMPARISON:  CT head 03/23/2009 FINDINGS: Brain: Ventricle size and cerebral volume normal for age.  Negative for acute infarct. Scattered small white matter hyperintensities appear chronic. Negative for mass or hemorrhage. No edema or midline shift. 8 x 12 mm pineal cyst, an incidental finding. Normal enhancement.  No enhancing metastatic deposits. Vascular: Normal arterial flow voids. Skull and upper cervical spine: Negative Sinuses/Orbits: Negative Other: None IMPRESSION: Negative for metastatic disease.  No acute intracranial abnormality. Electronically Signed   By: [name] M.D.   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 EXAM: NUCLEAR MEDICINE PET SKULL BASE TO THIGH TECHNIQUE: 9.8 mCi F-18 FDG was injected intravenously. Full-ring PET imaging was performed from the skull base to thigh after the radiotracer. CT data was obtained and used for attenuation correction and anatomic localization. Fasting blood glucose: 102 mg/dl COMPARISON:  12/31/2017 PET-CT. FINDINGS: Mediastinal blood pool activity: SUV max 2.9 Liver activity: SUV max 3.7 NECK: No hypermetabolic lymph nodes in the neck. Incidental CT findings: none CHEST: No enlarged or hypermetabolic axillary, mediastinal or hilar lymph nodes. No hypermetabolic pulmonary findings. Incidental CT findings: Minimally atherosclerotic nonaneurysmal thoracic aorta. No acute consolidative airspace disease, lung masses or significant pulmonary nodules. Stable nonspecific patchy subpleural reticulation and parenchymal banding in the mid to lower lungs bilaterally. ABDOMEN/PELVIS: There is intense hypermetabolism (max SUV 14.2) associated with a new 2.2 x 1.1 cm posterior left mesenteric lymph node (series 4/image 140). No additional enlarged or hypermetabolic lymph nodes in the abdomen or pelvis. Nonspecific hypermetabolism in the right testis (max SUV  6.1) without discrete  mass on the CT images, previous right testicular max SUV 4.5, mildly increased. No abnormal hypermetabolic activity within the liver, pancreas, adrenal glands, or spleen. Incidental CT findings: Scattered simple liver cysts, largest 2.7 cm in the lateral segment left liver lobe. Stable granulomatous splenic calcification. Moderate prostatomegaly. Moderate left colonic diverticulosis. Atherosclerotic nonaneurysmal abdominal aorta. SKELETON: No focal hypermetabolic activity to suggest skeletal metastasis. Incidental CT findings: none IMPRESSION: 1. Intense hypermetabolism associated with a new 2.2 x 1.1 cm posterior left mesenteric lymph node, compatible with recurrent lymphoma. Deauville score 5. 2. Nonspecific right testicular hypermetabolism without discrete right testicular mass on the CT images, mildly increased from prior PET-CT. Findings are most likely physiologic, although involvement of the right testicle by lymphoma is difficult to exclude. 3. No additional potential sites of hypermetabolic lymphoma. 4.  Aortic Atherosclerosis (ICD10-I70.0). Electronically Signed   By: Ilona Sorrel M.D.   On: 10/27/2018 14:23    Discharge Exam:  Vitals:   11/18/18 2054 11/19/18 0634  BP: 118/81 (!) 105/58  Pulse: (!) 109 78  Resp: 18 17  Temp: 98 F (36.7 C) 97.9 F (36.6 C)  SpO2: (!) 86% 98%    General: WN older WM who is alert and generally healthy appearing.  Lungs: Clear to auscultation and symmetric breath sounds. Heart:  RRR. No murmur or rub. Abdomen: Soft. No mass. Normal bowel sounds.  Incisions look good.  Discharge Medications:   Allergies as of 11/19/2018      Reactions   Penicillins Other (See Comments)   Did it involve swelling of the face/tongue/throat, SOB, or low BP? Yes Did it involve sudden or severe rash/hives, skin peeling, or any reaction on the inside of your mouth or nose? Yes Did you need to seek medical attention at a hospital or doctor's office?  Yes When did it last happen?2005 If all above answers are "NO", may proceed with cephalosporin use.   Blood-group Specific Substance    NO BLOOD PRODUCTS    Oxycodone Other (See Comments)   Made pt "looney" per wife    Shellfish Allergy Hives   Shell fish and shrimp      Medication List    You have not been prescribed any medications.     Disposition: Discharge disposition: 01-Home or Self Care       Discharge Instructions    Diet - low sodium heart healthy   Complete by: As directed    Increase activity slowly   Complete by: As directed          Signed: Alphonsa Overall, M.D., [facility] Office:  [phone]  11/19/2018, 9:53 AM

## 2018-11-21 ENCOUNTER — Other Ambulatory Visit: Payer: Self-pay

## 2018-11-21 ENCOUNTER — Other Ambulatory Visit: Payer: Self-pay | Admitting: Oncology

## 2018-11-21 ENCOUNTER — Ambulatory Visit (HOSPITAL_COMMUNITY)
Admission: RE | Admit: 2018-11-21 | Discharge: 2018-11-21 | Disposition: A | Discharge status: Home / Self Care | Payer: Medicare Other | Source: Ambulatory Visit | Attending: Oncology | Admitting: Oncology

## 2018-11-21 DIAGNOSIS — C8599 Non-Hodgkin lymphoma, unspecified, extranodal and solid organ sites: Secondary | ICD-10-CM

## 2018-11-21 DIAGNOSIS — C8330 Diffuse large B-cell lymphoma, unspecified site: Secondary | ICD-10-CM

## 2018-11-21 NOTE — Progress Notes (Signed)
I called Michael Allen and also spoke with his wife [name] regarding the results of the recent biopsy.  What we are seeing is fat necrosis.  It is part of a larger area in the mesenteric that appeared inflamed to Dr. Lucia Gaskins.  In the original scan we did October 2019 that area did look mildly inflamed and the possibility of mesenteric adenitis was suggested.  I think the only way we can tell if Dr. Lucia Gaskins actually remove the "hot spot" seen on the August scan is to repeat a PET scan.  We do have to wait a few weeks to make sure everything quiets down and we do not see a simple healing in that area.  Accordingly the repeat PET scan will be the first week in October and [name] will see me shortly after that.  Obviously if we still have a hotspot there we will need to proceed to chemo and at this point I am considering additional R-CHOP with intrathecal methotrexate.  If the hotspot has resolved however all we would do is follow-up

## 2018-11-24 ENCOUNTER — Telehealth: Payer: Self-pay | Admitting: Oncology

## 2018-11-24 NOTE — Telephone Encounter (Signed)
R/s appt per 9/11 sch message - pt wife aware of appt date and time

## 2018-11-25 ENCOUNTER — Inpatient Hospital Stay: Payer: Medicare Other | Admitting: Oncology

## 2018-11-27 ENCOUNTER — Ambulatory Visit: Payer: Medicare Other | Admitting: Oncology

## 2018-12-16 ENCOUNTER — Other Ambulatory Visit: Payer: Self-pay

## 2018-12-16 ENCOUNTER — Ambulatory Visit (HOSPITAL_COMMUNITY)
Admission: RE | Admit: 2018-12-16 | Discharge: 2018-12-16 | Disposition: A | Discharge status: Home / Self Care | Payer: Medicare Other | Source: Ambulatory Visit | Attending: Oncology | Admitting: Oncology

## 2018-12-16 DIAGNOSIS — C8599 Non-Hodgkin lymphoma, unspecified, extranodal and solid organ sites: Secondary | ICD-10-CM | POA: Insufficient documentation

## 2018-12-16 DIAGNOSIS — C8330 Diffuse large B-cell lymphoma, unspecified site: Secondary | ICD-10-CM | POA: Diagnosis present

## 2018-12-16 DIAGNOSIS — N400 Enlarged prostate without lower urinary tract symptoms: Secondary | ICD-10-CM | POA: Insufficient documentation

## 2018-12-16 DIAGNOSIS — I2510 Atherosclerotic heart disease of native coronary artery without angina pectoris: Secondary | ICD-10-CM | POA: Insufficient documentation

## 2018-12-16 DIAGNOSIS — N200 Calculus of kidney: Secondary | ICD-10-CM | POA: Diagnosis not present

## 2018-12-16 DIAGNOSIS — I700 Atherosclerosis of aorta: Secondary | ICD-10-CM | POA: Insufficient documentation

## 2018-12-16 LAB — GLUCOSE, CAPILLARY: Glucose-Capillary: 97 mg/dL (ref 70–99)

## 2018-12-16 MED ORDER — FLUDEOXYGLUCOSE F - 18 (FDG) INJECTION
8.7300 | Freq: Once | INTRAVENOUS | Status: AC | PRN
  Administered 2018-12-16: 8.73 via INTRAVENOUS

## 2018-12-17 ENCOUNTER — Other Ambulatory Visit: Payer: Self-pay | Admitting: Oncology

## 2018-12-17 NOTE — Progress Notes (Signed)
I ran Michael Allen's case by Dr Brooke Dare who runs the lymphoma program at [facility] and asked whether in the absence of recurrence she would offer some sort of "completion treatment" such as 3 additional cycles of R-CHOP w IT-MTX.  She stated she did not think she would go back and do anything additional now.  Although there is high risk of CNS relapse, perhaps 15%, in testicular lymphoma, there is a good chance that even without prophylaxis this patient will not relapse.  She said that it was really good the scrotal radiation was done as that has been shown to decrease relapses and is associated with improved survival.  If he does relapse in the CNS or elsewhere she is stated she would be happy to see him and they can make management recommendations at that time.

## 2018-12-18 ENCOUNTER — Other Ambulatory Visit: Payer: Self-pay | Admitting: Oncology

## 2018-12-19 ENCOUNTER — Other Ambulatory Visit: Payer: Self-pay

## 2018-12-19 ENCOUNTER — Inpatient Hospital Stay: Payer: Medicare Other | Attending: Oncology | Admitting: Oncology

## 2018-12-19 VITALS — BP 122/75 | HR 87 | Temp 96.5°F | Resp 18 | Ht 74.0 in | Wt 187.5 lb

## 2018-12-19 DIAGNOSIS — C8599 Non-Hodgkin lymphoma, unspecified, extranodal and solid organ sites: Secondary | ICD-10-CM | POA: Diagnosis not present

## 2018-12-19 DIAGNOSIS — C8330 Diffuse large B-cell lymphoma, unspecified site: Secondary | ICD-10-CM | POA: Diagnosis not present

## 2018-12-19 DIAGNOSIS — Z85828 Personal history of other malignant neoplasm of skin: Secondary | ICD-10-CM | POA: Insufficient documentation

## 2018-12-19 NOTE — Progress Notes (Signed)
[facility]  [phone] [phone]   ID: Michael Allen DOB: 01-Jun-1945  MR#: [id number]  CHY#:[id number]  Patient Care Team: Seward Carol, MD as PCP - General (Internal Medicine) [name], MD as Consulting Physician (Urology) Aura Bibby, [name], MD as Consulting Physician (Oncology) [name], MD as Consulting Physician (Ophthalmology) [name], MD as Consulting Physician (Radiation Oncology) Alphonsa Overall, MD as Consulting Physician (General Surgery) [name], MD OTHER MD:   CHIEF COMPLAINT: Diffuse large cell non-Hodgkin's lymphoma, germinal center B-cell subtype  CURRENT TREATMENT: Observation   HISTORY OF CURRENT ILLNESS: From the original intake note 12/27/2017:  "The patient was referred to Dr. [name] for renal stones.  Incidentally he was noted to have left scrotal swelling.  This had been present several months and was initially felt to be a hydrocele.  Scrotal ultrasound was performed 12/06/2017.  This found no spermatocele but a solid mass in the left testis upper and lower pole.  Accordingly on 12/20/2017 the patient underwent left inguinal orchiectomy.  The pathology from this procedure (SZ [id number]) showed a diffuse large B cell non-Hodgkin's lymphoma, CD20 positive, CD10 weakly positive, no coexpression of CD5, and most consistent with a germinal center B-cell subtype.  I was called by Dr. [name] earlier today and the patient was brought in for further evaluation."  His subsequent history is as detailed below.   INTERVAL HISTORY: Michael Allen returns today for review of his recent studies and further discussion of management of his diffuse large cell B-cell non-Hodgkin's lymphoma. His wife [place] participated by speakerphone.  To review the recent history: He had restaging studies 10/27/2018 including a brain MRI which was negative.  The PET scan however showed a 2.2 x 1.1 left mesenteric lymph node with an  SUV max of 14.2.  There was also some nonspecific increased uptake and the right testicle area with an SUV of 6.1, without an obvious mass.  We followed up on the testicular issue with an ultrasound of the scrotum on 11/21/2018 which showed the right testicle to measure 4 x 1.7 x 3 cm with no suspicious masses.  We then decided to biopsy the mesenteric lymph node and this had to be done laparoscopically since interventional radiology did not feel it was safe to biopsy transabdominally given that this was close to a vessel and they would not be able to stop the bleeding if bleeding developed.  Accordingly Michael Allen underwent laparoscopy 11/18/2018 and Dr. Lucia Gaskins removed a "lymph node" which showed ([id number]) fat necrosis.  There was no evidence of nodal tissue.  The question then was whether this area was indeed the one that we had seen on the PET scan.  We waited until 12/16/2018 to repeat the PET so that there would not be postoperative uptake.  This showed the previously hot lymph node to still be in place--I confirmed with radiology that this was the same lymph node in the same location--however it is now smaller, at 1.5 x 0.6 cm, with decreased metabolism, now at 7.3.  Michael Allen is here today to discuss those results and how to proceed from here   Lab Results  Component Value Date   LDH 171 10/20/2018   LDH 165 04/29/2018   LDH 163 03/11/2018   LDH 179 03/03/2018   LDH 259 (H) 02/17/2018   Lab Results  Component Value Date   TESTOSTERONE 476 10/20/2018   TESTOSTERONE 630 03/03/2018    REVIEW OF SYSTEMS:  Michael Allen has had no "B" symptoms. 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 He denies pain, difficulty urinating, bloating or other GI symptoms, h/a, N/V, visual changes, dizzyness or gait imbalnce.  A detailed ROS was otherwise noncontributory today  PAST MEDICAL HISTORY: Past Medical History:  Diagnosis Date  .  Chalazion   . Dental crowns present   . GERD (gastroesophageal reflux disease)   . History of kidney stones 10/25/2017   right nonobstructing stone noted on CT ABD/Pelvis  . Hydronephrosis 10/25/2017   Mild to moderate right due to 2m stone right distal, noted on CT abd/pelvis  . Hydroureter, right 10/25/2017   438mstone right distal, noted on CT abd/pelvis  . Impaired fasting glucose   . Migraine    optical  . Plantar fasciitis 2003  . PONV (postoperative nausea and vomiting)    light  . Pure hyperglyceridemia   . Rectal bleeding 2002  . Recurrent spontaneous pneumothorax    due to blebsx2  . Skin cancer of scalp 2011  . Transfusion of blood product refused for religious reason   . Ureteral stone 10/25/2017   [id number] right distal, noted on CT abd/pelvis  . Wears glasses     PAST SURGICAL HISTORY: Past Surgical History:  Procedure Laterality Date  . CHALAZION EXCISION Right    cyst removal , right eye  . COLONOSCOPY    . [id number]  . INGUINAL HERNIA REPAIR Bilateral 2005   wire mesh  . LAPAROSCOPIC REMOVAL ABDOMINAL MASS N/A 11/18/2018   Procedure: LAPAROSCOPIC EXPLORATION FOR MESENTERIC LYMPH NODE;  Surgeon: NewAlphonsa OverallD;  Location: [facility];  Service: General;  Laterality: N/A;  . LUNG SURGERY     x2  . ORCHIECTOMY Left 12/20/2017   Procedure: ORCHIECTOMY;  Surgeon: DahFranchot GalloD;  [facility];  Laterality: Left;  . TONSILLECTOMY      FAMILY HISTORY Family History  Problem Relation Age of Onset  . Dementia Mother        progressive  . Breast cancer Mother   . Heart Problems Father        pacemaker  . CAD Father   . Heart disease Father    The patient's father died from heart and lung problems in his 80s59sThe patient's mother had breast cancer in her 50s26sied in her 90s31som Alzheimer's disease.  The patient had no brothers, 2 sisters.  One sister has what sounds like lung cancer  metastatic to the brain, in her 70s31sOne maternal cousin was diagnosed with Ms. to have been breast cancer metastatic to the lung in her 50s66s SOCIAL HISTORY:  The patient retired from [employer] than 20 years ago.  He is very active in the [employer] ministry.   His wife NanBonnita Nasuti an education major but mostly worked as a housewife.  Daughter BetEustaquio Maize a "mom", and sign language interpreted as well as very active in the natural path movement.  [place] he works for the [employer] producerThe patient has 2 grandchildren   ADVANCED DIRECTIVES: Please note patient would refuse a blood transfusion even for lifesaving reasons (discussed 12/27/2017)   HEALTH MAINTENANCE: Social History   Tobacco Use  . Smoking status: Former [employer] scientist (life sciences) Smokeless tobacco: Never Used  . Tobacco comment: age 29 16ubstance Use  Topics  . Alcohol use: Yes    Comment: occ beer  . Drug use: Never     Colonoscopy: 2015  PSA:  Bone density:   Allergies  Allergen Reactions  . Penicillins Other (See Comments)    Did it involve swelling of the face/tongue/throat, SOB, or low BP? Yes Did it involve sudden or severe rash/hives, skin peeling, or any reaction on the inside of your mouth or nose? Yes Did you need to seek medical attention at a hospital or doctor's office? Yes When did it last happen?2005 If all above answers are "NO", may proceed with cephalosporin use.   Lyndle Herrlich Specific Substance     NO BLOOD PRODUCTS   . Oxycodone Other (See Comments)    Made pt "looney" per wife   . Shellfish Allergy Hives    Shell fish and shrimp    No current outpatient medications on file.   No current facility-administered medications for this visit.     OBJECTIVE: Middle-aged white man in no acute distress   Vitals:   12/19/18 0914  BP: 122/75  Pulse: 87  Resp: 18  Temp: (!) 96.5 F (35.8 C)  SpO2: 99%     Body mass index is 24.07  kg/m.   Wt Readings from Last 3 Encounters:  12/19/18 187 lb 8 oz (85 kg)  11/18/18 186 lb (84.4 kg)  11/14/18 186 lb (84.4 kg)   ECOG FS:0 - Asymptomatic  Sclerae unicteric, EOMs intact Wearing a mask No cervical or supraclavicular adenopathy, no axillary or inguinal adenopathy Lungs no rales or rhonchi Heart regular rate and rhythm Abd soft, nontender, positive bowel sounds MSK no focal spinal tenderness, no lymphedema Neuro: nonfocal, well oriented, appropriate affect    LAB RESULTS:  CMP     Component Value Date/Time   NA 140 11/14/2018 0918   K 4.4 11/14/2018 0918   CL 107 11/14/2018 0918   CO2 27 11/14/2018 0918   GLUCOSE 110 (H) 11/14/2018 0918   BUN 14 11/14/2018 0918   CREATININE 0.79 11/14/2018 0918   CREATININE 0.74 03/11/2018 1205   CALCIUM 8.8 (L) 11/14/2018 0918   PROT 7.4 10/20/2018 1403   ALBUMIN 4.4 10/20/2018 1403   AST 27 10/20/2018 1403   AST 15 03/11/2018 1205   ALT 19 10/20/2018 1403   ALT 14 03/11/2018 1205   ALKPHOS 64 10/20/2018 1403   BILITOT 0.7 10/20/2018 1403   BILITOT 0.8 03/11/2018 1205   GFRNONAA >60 11/14/2018 0918   GFRNONAA >60 03/11/2018 1205   GFRAA >60 11/14/2018 0918   GFRAA >60 03/11/2018 1205    No results found for: Ronnald Ramp, A1GS, A2GS, BETS, BETA2SER, GAMS, MSPIKE, SPEI  No results found for: Nils Pyle, [facility]  Lab Results  Component Value Date   WBC 5.0 11/14/2018   NEUTROABS 3.5 10/20/2018   HGB 14.4 11/14/2018   HCT 45.0 11/14/2018   MCV 96.4 11/14/2018   PLT 189 11/14/2018      Chemistry      Component Value Date/Time   NA 140 11/14/2018 0918   K 4.4 11/14/2018 0918   CL 107 11/14/2018 0918   CO2 27 11/14/2018 0918   BUN 14 11/14/2018 0918   CREATININE 0.79 11/14/2018 0918   CREATININE 0.74 03/11/2018 1205      Component Value Date/Time   CALCIUM 8.8 (L) 11/14/2018 0918   ALKPHOS 64 10/20/2018 1403   AST 27 10/20/2018 1403   AST 15 03/11/2018 1205   ALT 19  10/20/2018 1403  ALT 14 03/11/2018 1205   BILITOT 0.7 10/20/2018 1403   BILITOT 0.8 03/11/2018 1205       No results found for: LABCA2  No components found for: [id number]  No results for input(s): INR in the last 168 hours.  No results found for: LABCA2  No results found for: NWG956  No results found for: OZH086  No results found for: VHQ469  No results found for: CA2729  No components found for: HGQUANT  No results found for: CEA1 / No results found for: CEA1   No results found for: AFPTUMOR  No results found for: CHROMOGRNA  No results found for: PSA1  No visits with results within 3 Day(s) from this visit.  Latest known visit with results is:  Hospital Outpatient Visit on 12/16/2018  Component Date Value Ref Range Status  . Glucose-Capillary 12/16/2018 97  70 - 99 mg/dL Final    (this displays the last labs from the last 3 days)  No results found for: TOTALPROTELP, ALBUMINELP, A1GS, A2GS, BETS, BETA2SER, GAMS, MSPIKE, SPEI (this displays SPEP labs)  No results found for: KPAFRELGTCHN, LAMBDASER, KAPLAMBRATIO (kappa/lambda light chains)  No results found for: HGBA, HGBA2QUANT, HGBFQUANT, HGBSQUAN (Hemoglobinopathy evaluation)   Lab Results  Component Value Date   LDH 171 10/20/2018    No results found for: IRON, TIBC, IRONPCTSAT (Iron and TIBC)  No results found for: FERRITIN  Urinalysis No results found for: COLORURINE, APPEARANCEUR, LABSPEC, PHURINE, GLUCOSEU, HGBUR, BILIRUBINUR, KETONESUR, PROTEINUR, UROBILINOGEN, NITRITE, LEUKOCYTESUR   STUDIES: US Scrotum  Result Date: 11/22/2018 CLINICAL DATA:  73 year old male with history of testicular lymphoma and LEFT orchectomy. Evaluate RIGHT testicle. EXAM: ULTRASOUND OF SCROTUM TECHNIQUE: Complete ultrasound examination of the testicles, epididymis, and other scrotal structures was performed. COMPARISON:  None. FINDINGS: Right testicle Measurements: 4 x 1.7 x 3 cm. No suspicious mass identified. A  possible tiny calcification within the testicle noted. Left testicle Not visualized compatible with orchiectomy. Right epididymis:  A 5 mm epididymal cyst is noted. Hydrocele:  None visualized. Varicocele:  None visualized. IMPRESSION: No evidence of RIGHT testicular mass. Status post LEFT orchiectomy. Electronically Signed   By: Margarette Canada M.D.   On: 11/22/2018 19:37   Nm Pet Image Restag (ps) Skull Base To Thigh  Result Date: 12/16/2018 CLINICAL DATA:  Subsequent treatment strategy for diffuse large B-cell lymphoma of the left testis status post left orchiectomy 12/20/2017 and chemotherapy. EXAM: NUCLEAR MEDICINE PET SKULL BASE TO THIGH TECHNIQUE: 8.7 mCi F-18 FDG was injected intravenously. Full-ring PET imaging was performed from the skull base to thigh after the radiotracer. CT data was obtained and used for attenuation correction and anatomic localization. Fasting blood glucose: 97 mg/dl COMPARISON:  10/27/2018 PET-CT. FINDINGS: Mediastinal blood pool activity: SUV max 2.8 Liver activity: SUV max 3.6 NECK: No hypermetabolic lymph nodes in the neck. Incidental CT findings: none CHEST: No enlarged or hypermetabolic axillary, mediastinal or hilar lymph nodes. No hypermetabolic pulmonary findings. Incidental CT findings: Left anterior descending coronary atherosclerosis. Mildly atherosclerotic nonaneurysmal thoracic aorta. Granulomatous calcified right paratracheal node is unchanged. No acute consolidative airspace disease, lung masses or significant pulmonary nodules. ABDOMEN/PELVIS: Hypermetabolic posterior left mesenteric 1.5 x 0.6 cm node with max SUV 7.3 (series 4/image 145), previously 2.2 x 1.1 cm with max SUV 14.2, decreased in size and metabolism. Otherwise no hypermetabolic lymph nodes in the abdomen or pelvis. No abnormal hypermetabolic activity within the liver, pancreas, adrenal glands, or spleen. Left orchiectomy. Non focal hypermetabolism in the right testis is unchanged, with no  lesions on the  CT images in the right testis. Incidental CT findings: Scattered simple liver cysts, largest 3.1 cm adjacent to the gallbladder. Additional subcentimeter hypodense lesions scattered in the liver are too small to characterize and are unchanged. Stable granulomatous splenic calcification. Nonobstructing 3 mm stone in the lower right kidney. Atherosclerotic nonaneurysmal abdominal aorta. Moderate left colonic diverticulosis. Marked prostatomegaly. Hernia repair mesh in the ventral pelvis bilaterally. SKELETON: No focal hypermetabolic activity to suggest skeletal metastasis. Incidental CT findings: none IMPRESSION: 1. Partial metabolic response. Hypermetabolic posterior left mesenteric node is decreased in size and metabolism. Deauville score 4. 2. No new or progressive hypermetabolic lymphoma. 3. Chronic findings include: Aortic Atherosclerosis (ICD10-I70.0). Coronary atherosclerosis. Nonobstructing right nephrolithiasis. Prostatomegaly. Electronically Signed   By: Ilona Sorrel M.D.   On: 12/16/2018 18:24     ELIGIBLE FOR AVAILABLE RESEARCH PROTOCOL: no  ASSESSMENT: 73 y.o. Jehovah's Witness status post left inguinal orchiectomy 12/20/2017 for a diffuse large cell non-Hodgkin's lymphoma, germinal center B-cell subtype, CD20 positive, CD5 negative.  (1) staging studies: (a) PET scan 12/31/2017: Uptake only in the right testicle and panniculus, both of questionable significance (b) bone marrow biopsy 01/01/2018 shows no evidence of lymphoma (c) lumbar puncture 02/27/2018 benign  (2) international prognostic index: 2 (age, LDH 1 week post-op)  = 80% predicted PFS4; stage I   (3) adjuvant chemotherapy: cyclophosphamide, doxorubicin, vincristine, prednisone, rituximab   (a) hep B surface Ag and cor Ab negative OCT 2019  (b) received 3 cycles of CHOP-R started 01/16/2018, completed 03/03/2018  (4) adjuvant radiation 04/02/18-04/25/18 Site/dose:  Right Testicle/Scrotum, 18 fractions of 1.8 Gy for a total  of 32.4 Gy  RECURRENT DISEASE?: AUG 2020 (5) PET scan 10/27/2018 shows a left mesenteric lymph node, 2.2 cm, SUV 14.2; questionable increased SUV right testicle without apparent mass  (a) brain MRI 10/27/2018 no evidence of metastatic disease  (b) scrotal ultrasound on 11/22/2018 shows a normal right testicle, no suspicious masses  (c) laparoscopy 11/18/2018 retrieved and area thought to be the "hot" lymph node, proved to be fat necrosis, with no malignancy and also no nodal tissue identified  (d) repeat PET scan 12/16/2018 shows the "hot lymph node" still in place, but smaller, with decreased SUV (1.5 cm/ 7.3SUV)  PLAN: Turner's situation is complex and we spent >50 min today going over options. We reviewed the fact that primary DLBCL of the testis are rare, so we have no category 1 data to guide treatment. He received standard treatment for a localized DLBCL but many hematologists would have treated him according to the IELSG-10 phase 2 study of 53 patients, who received 6 cycles of CHOP-R and IT-MTX x4 in addition to scrotal XRT, even though CNS recurrences in this subtype are generally parenchymal (where IT-MTX penetration would be poor). The first question we addressed is whether we should proceed to add those treatments at this point (3 more R-CHOP doses and IT-MTX). I discussed this with a lymphoma specialist at a tertiary center and their suggestion was not to do that in the absence of recurrence (and to refer him there for treatment if he recurs).  Accordingly the question becomes whether we are dealing with recurrence or something else being picked up by the PET. The fact that the "hot" node is regressing in the absence of any treatment (I confirmed Zacharie is not on supplements or sterids for other reasons) argues against recurrence.  At laparoscopy Dr Lucia Gaskins describes "a thickened mesentery of his proximal small bowel and whitish changes consistent with  mesenteric lymphatic obstruction."  The mass  removed by laparoscopy proved to be fat necrosis. Sotirios did lose 35 lbs over the last 3 years, though his weight has been stable over the past year or so. It could be we are dealing with fat necrosis, which can be hot on PET, or inflammation related to the patient's known diverticular disease. However we cannot be 100% sure in the absence of pathology, and [place] is understandably reluctant to undergo more invasive procedures.  He understands also that recurrences are usually incurable. However maintenance rituximab has not been shown to be helpful in DLBCL and it is not clear he would benefit from other 'easy" treatments (ibrutinib for example is less effective in GCB-type DLBCL like Alegandro's, quite aside from concerns re possible complications and side-effects). I sked his radiation oncologist to review the PET as well and quite aside from the fact that we would not know what we would be treating he felt localization of the small area in question would be difficult at best.   After all this we decided to repeat a PET and brain MRI in 3 months. He will see me shortly after that to discuss results and of course he will contact me at any time is he develops "B" Sx or any other concerns, especially and h/a, N/V or visual changes. We will repeat labs at that time as well.    Journei Thomassen, [name], MD  12/20/18 10:15 AM Medical Oncology and Hematology [facility] [address] [place], [place] [address] Tel. [phone]    Fax. [phone]   I, Jacqualyn Posey am acting as a Education administrator for [name], MD.   I, Lurline Del MD, have reviewed the above documentation for accuracy and completeness, and I agree with the above.

## 2018-12-22 ENCOUNTER — Telehealth: Payer: Self-pay | Admitting: Oncology

## 2018-12-22 NOTE — Telephone Encounter (Signed)
I talk with patient regarding schedule  

## 2019-03-04 ENCOUNTER — Telehealth: Payer: Self-pay | Admitting: Adult Health

## 2019-03-04 ENCOUNTER — Telehealth: Payer: Self-pay

## 2019-03-04 NOTE — Telephone Encounter (Signed)
Spoke with Dr. Reinaldo Berber about PET scan authorization for patient.  PET approved [id number] Exp 04/18/2019  Time Spent: 5 minutes  Wilber Bihari, NP

## 2019-03-04 NOTE — Telephone Encounter (Signed)
TC from above Pt.'s wife inquiring about canceling MRI appointment because of Covid concerns. Pt's wife wanted to know if this was ok. Per Dr. Jana Hakim ok to cancel lab and MRI appointment and just do the Pet Scan, after Pet Scan results he will consider if Pt should have MRI. Message relayed to Pt. Himself about Dr. [name] recommendation. MRI and lab appointment canceled. Pt reminded to be NPO and no carbs 6 hours before Pet Scan.Pt. verbalized understanding.

## 2019-03-09 ENCOUNTER — Inpatient Hospital Stay: Payer: Medicare Other

## 2019-03-09 ENCOUNTER — Ambulatory Visit (HOSPITAL_COMMUNITY): Payer: Medicare Other

## 2019-03-09 ENCOUNTER — Other Ambulatory Visit: Payer: Self-pay

## 2019-03-09 ENCOUNTER — Ambulatory Visit (HOSPITAL_COMMUNITY)
Admission: RE | Admit: 2019-03-09 | Discharge: 2019-03-09 | Disposition: A | Discharge status: Home / Self Care | Payer: Medicare Other | Source: Ambulatory Visit | Attending: Oncology | Admitting: Oncology

## 2019-03-09 DIAGNOSIS — I2510 Atherosclerotic heart disease of native coronary artery without angina pectoris: Secondary | ICD-10-CM | POA: Insufficient documentation

## 2019-03-09 DIAGNOSIS — N200 Calculus of kidney: Secondary | ICD-10-CM | POA: Diagnosis not present

## 2019-03-09 DIAGNOSIS — I700 Atherosclerosis of aorta: Secondary | ICD-10-CM | POA: Diagnosis not present

## 2019-03-09 DIAGNOSIS — C8330 Diffuse large B-cell lymphoma, unspecified site: Secondary | ICD-10-CM | POA: Diagnosis present

## 2019-03-09 DIAGNOSIS — C8599 Non-Hodgkin lymphoma, unspecified, extranodal and solid organ sites: Secondary | ICD-10-CM | POA: Diagnosis present

## 2019-03-09 LAB — GLUCOSE, CAPILLARY: Glucose-Capillary: 93 mg/dL (ref 70–99)

## 2019-03-09 MED ORDER — FLUDEOXYGLUCOSE F - 18 (FDG) INJECTION
9.1000 | Freq: Once | INTRAVENOUS | Status: AC | PRN
  Administered 2019-03-09: 12:00:00 9.1 via INTRAVENOUS

## 2019-03-15 NOTE — Progress Notes (Signed)
[place]  Telephone:([phone]) [phone] Fax:(336) [phone]   ID: JAMARIAN JACINTO DOB: 1945-11-03  MR#: [id number]  HRC#:[id number]  Patient Care Team: Seward Carol, MD as PCP - General (Internal Medicine) [name], MD as Consulting Physician (Urology) Colie Josten, [name], MD as Consulting Physician (Oncology) [name], MD as Consulting Physician (Ophthalmology) [name], MD as Consulting Physician (Radiation Oncology) Alphonsa Overall, MD as Consulting Physician (General Surgery) [name], MD OTHER MD:  I connected with Sena Hitch on 03/16/19 at  1:30 PM EST by video enabled telemedicine visit and verified that I am speaking with the correct person using two identifiers.   I discussed the limitations, risks, security and privacy concerns of performing an evaluation and management service by telemedicine and the availability of in-person appointments. I also discussed with the patient that there may be a patient responsible charge related to this service. The patient expressed understanding and agreed to proceed.   Other persons participating in the visit and their role in the encounter: Demarri's wife [name]  Patient's location: home  Provider's location: [place]    CHIEF COMPLAINT: Diffuse large cell non-Hodgkin's lymphoma, germinal center B-cell subtype  CURRENT TREATMENT: Observation   INTERVAL HISTORY: Zia was contacted today for follow up of his diffuse large cell B-cell non-Hodgkin's lymphoma.  His wife [name] also participated in the virtual visit  Since his last visit, he underwent restaging PET scan on 03/09/2019, which revealed: partial metabolic response to therapy of lymphoma, isolated small bowel mesenteric node is decreased in size and hypermetabolism; no new or progressive findings.  He was also supposed to undergo repeat brain MRI, but they requested this be cancelled due to Covid concerns.   Lab Results   Component Value Date   LDH 171 10/20/2018   LDH 165 04/29/2018   LDH 163 03/11/2018   LDH 179 03/03/2018   LDH 259 (H) 02/17/2018   Lab Results  Component Value Date   TESTOSTERONE 476 10/20/2018   TESTOSTERONE 630 03/03/2018    REVIEW OF SYSTEMS:  Ostin is doing fine.  There they had a quiet holidays.  They call their son in [place].  He exercises by walking outside about 30 minutes most days.  He also goes up and down the stairs more than 10 times daily he says.  He has gained a few pounds.  He has had no rash no fever no drenching sweats no adenopathy no unusual headaches, no visual changes other than some "lights" that he sees sometimes and has for the last several years, also no gait imbalance no falls no nausea or vomiting.  A detailed review of systems today was entirely benign.   HISTORY OF CURRENT ILLNESS: From the original intake note 12/27/2017:  "The patient was referred to Dr. [name] for renal stones.  Incidentally he was noted to have left scrotal swelling.  This had been present several months and was initially felt to be a hydrocele.  Scrotal ultrasound was performed 12/06/2017.  This found no spermatocele but a solid mass in the left testis upper and lower pole.  Accordingly on 12/20/2017 the patient underwent left inguinal orchiectomy.  The pathology from this procedure (SZ [phone]) showed a diffuse large B cell non-Hodgkin's lymphoma, CD20 positive, CD10 weakly positive, no coexpression of CD5, and most consistent with a germinal center B-cell subtype.  I was called by Dr. [name] earlier today and the patient was brought in for further evaluation."  His subsequent history is  as detailed below.   PAST MEDICAL HISTORY: Past Medical History:  Diagnosis Date  . Chalazion   . Dental crowns present   . GERD (gastroesophageal reflux disease)   . History of kidney stones 10/25/2017   right nonobstructing stone noted on CT ABD/Pelvis  . Hydronephrosis 10/25/2017    Mild to moderate right due to 27m stone right distal, noted on CT abd/pelvis  . Hydroureter, right 10/25/2017   421mstone right distal, noted on CT abd/pelvis  . Impaired fasting glucose   . Migraine    optical  . Plantar fasciitis 2003  . PONV (postoperative nausea and vomiting)    light  . Pure hyperglyceridemia   . Rectal bleeding 2002  . Recurrent spontaneous pneumothorax    due to blebsx2  . Skin cancer of scalp 2011  . Transfusion of blood product refused for religious reason   . Ureteral stone 10/25/2017   62m39mtone right distal, noted on CT abd/pelvis  . Wears glasses     PAST SURGICAL HISTORY: Past Surgical History:  Procedure Laterality Date  . CHALAZION EXCISION Right    cyst removal , right eye  . COLONOSCOPY    . [id number]  . INGUINAL HERNIA REPAIR Bilateral 2005   wire mesh  . LAPAROSCOPIC REMOVAL ABDOMINAL MASS N/A 11/18/2018   Procedure: LAPAROSCOPIC EXPLORATION FOR MESENTERIC LYMPH NODE;  Surgeon: NewAlphonsa OverallD;  Location: [facility];  Service: General;  Laterality: N/A;  . LUNG SURGERY     x2  . ORCHIECTOMY Left 12/20/2017   Procedure: ORCHIECTOMY;  Surgeon: DahFranchot GalloD;  [facility];  Laterality: Left;  . TONSILLECTOMY      FAMILY HISTORY Family History  Problem Relation Age of Onset  . Dementia Mother        progressive  . Breast cancer Mother   . Heart Problems Father        pacemaker  . CAD Father   . Heart disease Father    The patient's father died from heart and lung problems in his 75s53sThe patient's mother had breast cancer in her 50s53sied in her 90s60som Alzheimer's disease.  The patient had no brothers, 2 sisters.  One sister has what sounds like lung cancer metastatic to the brain, in her 70s84sOne maternal cousin was diagnosed with Ms. to have been breast cancer metastatic to the lung in her 50s76s SOCIAL HISTORY:  The patient retired from [employer]  than 20 years ago.  He is very active in the [employer] ministry.   His wife NanBonnita Nasuti an education major but mostly worked as a housewife.  Daughter BetEustaquio Maize a "mom", and sign language interpreted as well as very active in the natural path movement.  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 Smoking status: Former [employer] scientist (life sciences) Smokeless tobacco: Never Used  . Tobacco comment: age 39 79ubstance Use Topics  . Alcohol use: Yes    Comment: occ beer  . Drug use: Never     Colonoscopy: 2015  PSA:  Bone density:   Allergies  Allergen Reactions  . Penicillins Other (See Comments)  Did it involve swelling of the face/tongue/throat, SOB, or low BP? Yes Did it involve sudden or severe rash/hives, skin peeling, or any reaction on the inside of your mouth or nose? Yes Did you need to seek medical attention at a hospital or doctor's office? Yes When did it last happen?2005 If all above answers are "NO", may proceed with cephalosporin use.   Lyndle Herrlich Specific Substance     NO BLOOD PRODUCTS   . Oxycodone Other (See Comments)    Made pt "looney" per wife   . Shellfish Allergy Hives    Shell fish and shrimp    No current outpatient medications on file.   No current facility-administered medications for this visit.    OBJECTIVE: Middle-aged white man who appears well   There were no vitals filed for this visit.   There is no height or weight on file to calculate BMI.   Wt Readings from Last 3 Encounters:  12/19/18 187 lb 8 oz (85 kg)  11/18/18 186 lb (84.4 kg)  11/14/18 186 lb (84.4 kg)   ECOG FS:0 - Asymptomatic  Telemedicine encounter 03/16/2019    LAB RESULTS:  CMP     Component Value Date/Time   NA  140 11/14/2018 0918   K 4.4 11/14/2018 0918   CL 107 11/14/2018 0918   CO2 27 11/14/2018 0918   GLUCOSE 110 (H) 11/14/2018 0918   BUN 14 11/14/2018 0918   CREATININE 0.79 11/14/2018 0918   CREATININE 0.74 03/11/2018 1205   CALCIUM 8.8 (L) 11/14/2018 0918   PROT 7.4 10/20/2018 1403   ALBUMIN 4.4 10/20/2018 1403   AST 27 10/20/2018 1403   AST 15 03/11/2018 1205   ALT 19 10/20/2018 1403   ALT 14 03/11/2018 1205   ALKPHOS 64 10/20/2018 1403   BILITOT 0.7 10/20/2018 1403   BILITOT 0.8 03/11/2018 1205   GFRNONAA >60 11/14/2018 0918   GFRNONAA >60 03/11/2018 1205   GFRAA >60 11/14/2018 0918   GFRAA >60 03/11/2018 1205    No results found for: Ronnald Ramp, A1GS, A2GS, BETS, BETA2SER, GAMS, MSPIKE, SPEI  No results found for: Nils Pyle, [facility]  Lab Results  Component Value Date   WBC 5.0 11/14/2018   NEUTROABS 3.5 10/20/2018   HGB 14.4 11/14/2018   HCT 45.0 11/14/2018   MCV 96.4 11/14/2018   PLT 189 11/14/2018      Chemistry      Component Value Date/Time   NA 140 11/14/2018 0918   K 4.4 11/14/2018 0918   CL 107 11/14/2018 0918   CO2 27 11/14/2018 0918   BUN 14 11/14/2018 0918   CREATININE 0.79 11/14/2018 0918   CREATININE 0.74 03/11/2018 1205      Component Value Date/Time   CALCIUM 8.8 (L) 11/14/2018 0918   ALKPHOS 64 10/20/2018 1403   AST 27 10/20/2018 1403   AST 15 03/11/2018 1205   ALT 19 10/20/2018 1403   ALT 14 03/11/2018 1205   BILITOT 0.7 10/20/2018 1403   BILITOT 0.8 03/11/2018 1205       No results found for: LABCA2  No components found for: [id number]  No results for input(s): INR in the last 168 hours.  No results found for: LABCA2  No results found for: YQI347  No results found for: QQV956  No results found for: LOV564  No results found for: CA2729  No components found for: HGQUANT  No results found for: CEA1 / No results found for: CEA1   No results found for: [facility]  No results found for:  CHROMOGRNA  No results found for: PSA1  No visits with results within 3 Day(s) from this visit.  Latest known visit with results is:  Hospital Outpatient Visit on 03/09/2019  Component Date Value Ref Range Status  . Glucose-Capillary 03/09/2019 93  70 - 99 mg/dL Final    (this displays the last labs from the last 3 days)  No results found for: TOTALPROTELP, ALBUMINELP, A1GS, A2GS, BETS, BETA2SER, GAMS, MSPIKE, SPEI (this displays SPEP labs)  No results found for: KPAFRELGTCHN, LAMBDASER, KAPLAMBRATIO (kappa/lambda light chains)  No results found for: HGBA, HGBA2QUANT, HGBFQUANT, HGBSQUAN (Hemoglobinopathy evaluation)   Lab Results  Component Value Date   LDH 171 10/20/2018    No results found for: IRON, TIBC, IRONPCTSAT (Iron and TIBC)  No results found for: FERRITIN  Urinalysis No results found for: COLORURINE, APPEARANCEUR, LABSPEC, PHURINE, GLUCOSEU, HGBUR, BILIRUBINUR, KETONESUR, PROTEINUR, UROBILINOGEN, NITRITE, LEUKOCYTESUR   STUDIES: NM PET Image Restag (PS) Skull Base To Thigh  Result Date: 03/09/2019 CLINICAL DATA:  Subsequent treatment strategy for diffuse large B-cell lymphoma. Evaluate treatment response. Lymphoma of left testes, status post left orchiectomy. EXAM: NUCLEAR MEDICINE PET SKULL BASE TO THIGH TECHNIQUE: 9.1 mCi F-18 FDG was injected intravenously. Full-ring PET imaging was performed from the skull base to thigh after the radiotracer. CT data was obtained and used for attenuation correction and anatomic localization. Fasting blood glucose: 93 mg/dl COMPARISON:  12/16/2018 FINDINGS: Mediastinal blood pool activity: SUV max 2.7 Liver activity: SUV max 3.5 NECK: No areas of abnormal hypermetabolism. Incidental CT findings: No cervical adenopathy. CHEST: No pulmonary parenchymal or thoracic nodal hypermetabolism. Incidental CT findings: Mild cardiomegaly. Lad coronary artery atherosclerosis. ABDOMEN/PELVIS: Isolated hypermetabolic small bowel mesenteric node  measures 4 mm and a S.U.V. max of 5.0 today versus 6 mm and a S.U.V. max of 7.3 on the prior exam. Incidental CT findings: Abdominal aortic atherosclerosis. Hepatic cysts. Posterior gastric diverticulum. Lower pole right renal 6 mm collecting system calculus. Right common iliac artery ectasia at 1.8 cm, similar. Moderate prostatomegaly. Apparent bladder wall thickening is at least partially felt to be due to underdistention. Inguinal and/or ventral pelvic wall hernia repairs. Left orchiectomy. SKELETON: No abnormal marrow activity. Incidental CT findings: Remote left rib trauma. IMPRESSION: 1. Partial metabolic response to therapy of lymphoma. Isolated small bowel mesenteric node is decreased in size and hypermetabolism. ([place]) 4. 2. No new or progressive disease. 3. Incidental findings, including: Coronary artery atherosclerosis. Right nephrolithiasis. Aortic Atherosclerosis (ICD10-I70.0). Right common iliac artery ectasia. Electronically Signed   By: Abigail Miyamoto M.D.   On: 03/09/2019 14:07     ELIGIBLE FOR AVAILABLE RESEARCH PROTOCOL: no  ASSESSMENT: 74 y.o. Jehovah's Witness status post left inguinal orchiectomy 12/20/2017 for a diffuse large cell non-Hodgkin's lymphoma, germinal center B-cell subtype, CD20 positive, CD5 negative.  (1) staging studies: (a) PET scan 12/31/2017: Uptake only in the right testicle and panniculus, both of questionable significance (b) bone marrow biopsy 01/01/2018 shows no evidence of lymphoma (c) lumbar puncture 02/27/2018 benign  (2) international prognostic index: 2 (age, LDH 1 week post-op)  = 80% predicted PFS4; stage I   (3) adjuvant chemotherapy: cyclophosphamide, doxorubicin, vincristine, prednisone, rituximab   (a) hep B surface Ag and cor Ab negative OCT 2019  (b) received 3 cycles of CHOP-R started 01/16/2018, completed 03/03/2018  (4) adjuvant radiation 04/02/18-04/25/18 Site/dose:  Right Testicle/Scrotum, 18 fractions of 1.8 Gy for a total of  32.4 Gy  RECURRENT DISEASE?: AUG 2020 (5) PET scan 10/27/2018 shows a left  mesenteric lymph node, 2.2 cm, SUV 14.2; questionable increased SUV right testicle without apparent mass  (a) brain MRI 10/27/2018 no evidence of metastatic disease  (b) scrotal ultrasound on 11/22/2018 shows a normal right testicle, no suspicious masses  (c) laparoscopy 11/18/2018 retrieved and area thought to be the "hot" lymph node, proved to be fat necrosis, with no malignancy and also no nodal tissue identified  (d) repeat PET scan 12/16/2018 shows the "hot lymph node" still in place, but smaller, with decreased SUV (originally 1.5 cm/ 7.3SUV)  (e) PET 03/09/2019 shows the hypermetabolic "lymph node" now to measure 0.4 mm, with an SUV max of 7.3  (f) brain MRI   PLAN: Willett is now a year out from completion of his chemotherapy with no evidence of disease recurrence.  This is very favorable.  Of course I am not sure what we are dealing with with that very small area in his mesentery, but it is now a 6 of an inch and fairly dim so it requires only further follow-up.  He understands we are concerned about the possibility of recurrence to the brain in they are agreeable to do a brain MRI once the Covid 19 situation improves.  Tentatively we are setting her up for a brain MRI in early March.  Incidentally he tells me he is using a RIFE ultraviolet machine, which his wife used previously for her rheumatoid arthritis.  I cautioned him against exposing his eyes to any projections from apparatus.  We discussed the vaccines in detail and I have ordered both of them to receive the vaccine as soon as 1 becomes available to them.  Otherwise he knows to call me if any unusual or persistent problems develop.  I will see him in late April for further follow-up   Ma Munoz, [name], MD  03/16/19 2:00 PM Medical Oncology and Hematology [facility] [place], [place] [address] Tel. [phone]    Fax.  [phone]    I, Wilburn Mylar, am acting as scribe for Dr. [name]. Shanae Luo.  I, Lurline Del MD, have reviewed the above documentation for accuracy and completeness, and I agree with the above.

## 2019-03-16 ENCOUNTER — Inpatient Hospital Stay: Payer: Medicare Other | Attending: Oncology | Admitting: Oncology

## 2019-03-16 ENCOUNTER — Ambulatory Visit: Payer: Medicare Other | Admitting: Oncology

## 2019-03-16 DIAGNOSIS — R590 Localized enlarged lymph nodes: Secondary | ICD-10-CM

## 2019-03-16 DIAGNOSIS — C8599 Non-Hodgkin lymphoma, unspecified, extranodal and solid organ sites: Secondary | ICD-10-CM | POA: Diagnosis not present

## 2019-03-16 DIAGNOSIS — C8330 Diffuse large B-cell lymphoma, unspecified site: Secondary | ICD-10-CM | POA: Diagnosis not present

## 2019-03-17 ENCOUNTER — Telehealth: Payer: Self-pay | Admitting: Oncology

## 2019-03-17 NOTE — Telephone Encounter (Signed)
I left a message regarding schedule  

## 2019-04-01 NOTE — Telephone Encounter (Signed)
No entry 

## 2019-04-02 ENCOUNTER — Ambulatory Visit: Payer: Medicare Other | Attending: Internal Medicine

## 2019-04-02 DIAGNOSIS — Z23 Encounter for immunization: Secondary | ICD-10-CM | POA: Insufficient documentation

## 2019-04-02 NOTE — Progress Notes (Signed)
   Covid-19 Vaccination Clinic  Name:  Michael Allen    MRN: [id number] DOB: 1946/01/30  04/02/2019  Mr. Arth was observed post Covid-19 immunization for 15 minutes without incidence. He was provided with Vaccine Information Sheet and instruction to access the V-Safe system.   Mr. Holms was instructed to call 911 with any severe reactions post vaccine: Marland Kitchen Difficulty breathing  . Swelling of your face and throat  . A fast heartbeat  . A bad rash all over your body  . Dizziness and weakness    Immunizations Administered    Name Date Dose VIS Date Route   Pfizer COVID-19 Vaccine 04/02/2019  3:37 PM 0.3 mL 02/20/2019 Intramuscular   Manufacturer: [place]   Lot: [id number]   [place]: [id number]

## 2019-04-23 ENCOUNTER — Ambulatory Visit: Payer: Medicare Other | Attending: Internal Medicine

## 2019-04-23 DIAGNOSIS — Z23 Encounter for immunization: Secondary | ICD-10-CM | POA: Insufficient documentation

## 2019-04-23 NOTE — Progress Notes (Signed)
   Covid-19 Vaccination Clinic  Name:  Michael Allen    MRN: [id number] DOB: Jun 06, 1945  04/23/2019  Mr. Friberg was observed post Covid-19 immunization for 15 minutes without incidence. He was provided with Vaccine Information Sheet and instruction to access the V-Safe system.   Mr. Kwiatkowski was instructed to call 911 with any severe reactions post vaccine: Marland Kitchen Difficulty breathing  . Swelling of your face and throat  . A fast heartbeat  . A bad rash all over your body  . Dizziness and weakness    Immunizations Administered    Name Date Dose VIS Date Route   Pfizer COVID-19 Vaccine 04/23/2019  4:35 PM 0.3 mL 02/20/2019 Intramuscular   Manufacturer: [place]   Lot: [id number]   [place]: [id number]

## 2019-07-06 NOTE — Progress Notes (Signed)
[name]  Telephone:([phone]) [phone] [phone]) [phone]   ID: Michael Allen DOB: [birth year]  MR#: [id number]  XVQ#:[id number]  Patient Care Team: Seward Carol, MD as PCP - General (Internal Medicine) [name], MD as Consulting Physician (Urology) Eloina Ergle, [name], MD as Consulting Physician (Oncology) [name], MD as Consulting Physician (Ophthalmology) [name], MD as Consulting Physician (Radiation Oncology) Alphonsa Overall, MD as Consulting Physician (General Surgery) [name], MD OTHER MD:   CHIEF COMPLAINT: Diffuse large cell non-Hodgkin's lymphoma, germinal center B-cell subtype  CURRENT TREATMENT: Observation   INTERVAL HISTORY: Michael Allen returns today for follow up of his diffuse large cell B-cell non-Hodgkin's lymphoma.  His wife Michael Allen was not sure if she could come in because of the recent Covid issues but she is welcome of course especially since both of them have had their vaccines.  [name] received both doses of the [employer] vaccine-- on 04/02/2019 and 04/23/2019.  Both he and then tolerated it well.  He was supposed to undergo repeat brain MRI, but they requested this be postponed due to Covid concerns.  This is being rescheduled  Lab Results  Component Value Date   LDH 171 10/20/2018   LDH 165 04/29/2018   LDH 163 03/11/2018   LDH 179 03/03/2018   LDH 259 (H) 02/17/2018   Lab Results  Component Value Date   TESTOSTERONE 476 10/20/2018   TESTOSTERONE 630 03/03/2018    REVIEW OF SYSTEMS:  Michael Allen exercises mostly by walking.  He has done so far today more than 1800 steps and walked 6 flights.  He drinks a lot of fluid.  He has a little bit of minimal leakage which she is going to discuss with Dr. [name] when he sees him sometime this summer.  Michael Allen has had no unusual headaches visual changes nausea vomiting dizziness gait imbalance or falls.  There has been no cough phlegm production or pleurisy and no change in bowel  habits.  Has been no rash no fevers and no drenching sweats.  He used the [employer] which he thinks may be the reason what ever the spot was or is in his abdomen may have shrunk and he brought me information regarding that today which I was glad to review.   HISTORY OF CURRENT ILLNESS: From the original intake note 12/27/2017:  "The patient was referred to Dr. [name] for renal stones.  Incidentally he was noted to have left scrotal swelling.  This had been present several months and was initially felt to be a hydrocele.  Scrotal ultrasound was performed 12/06/2017.  This found no spermatocele but a solid mass in the left testis upper and lower pole.  Accordingly on 12/20/2017 the patient underwent left inguinal orchiectomy.  The pathology from this procedure (SZ [id number]) showed a diffuse large B cell non-Hodgkin's lymphoma, CD20 positive, CD10 weakly positive, no coexpression of CD5, and most consistent with a germinal center B-cell subtype.  I was called by Dr. [name] earlier today and the patient was brought in for further evaluation."  His subsequent history is as detailed below.   PAST MEDICAL HISTORY: Past Medical History:  Diagnosis Date  . Chalazion   . Dental crowns present   . GERD (gastroesophageal reflux disease)   . History of kidney stones 10/25/2017   right nonobstructing stone noted on CT ABD/Pelvis  . Hydronephrosis 10/25/2017   Mild to moderate right due to 53m stone right distal, noted on CT abd/pelvis  . Hydroureter, right 10/25/2017  26m stone right distal, noted on CT abd/pelvis  . Impaired fasting glucose   . Migraine    optical  . Plantar fasciitis 2003  . PONV (postoperative nausea and vomiting)    light  . Pure hyperglyceridemia   . Rectal bleeding 2002  . Recurrent spontaneous pneumothorax    due to blebsx2  . Skin cancer of scalp 2011  . Transfusion of blood product refused for religious reason   . Ureteral stone 10/25/2017    449mstone right distal, noted on CT abd/pelvis  . Wears glasses     PAST SURGICAL HISTORY: Past Surgical History:  Procedure Laterality Date  . CHALAZION EXCISION Right    cyst removal , right eye  . COLONOSCOPY    [place]  . INGUINAL HERNIA REPAIR Bilateral 2005   wire mesh  . LAPAROSCOPIC REMOVAL ABDOMINAL MASS N/A 11/18/2018   Procedure: LAPAROSCOPIC EXPLORATION FOR MESENTERIC LYMPH NODE;  Surgeon: NeAlphonsa OverallMD;  Location: [facility];  Service: General;  Laterality: N/A;  . LUNG SURGERY     x2  . ORCHIECTOMY Left 12/20/2017   Procedure: ORCHIECTOMY;  Surgeon: DaFranchot GalloMD;  [facility] Service: Urology;  Laterality: Left;  . TONSILLECTOMY      FAMILY HISTORY Family History  Problem Relation Age of Onset  . Dementia Mother        progressive  . Breast cancer Mother   . Heart Problems Father        pacemaker  . CAD Father   . Heart disease Father    The patient's father died from heart and lung problems in his 80105s The patient's mother had breast cancer in her 7557sdied in her 7582srom Alzheimer's disease.  The patient had no brothers, 2 sisters.  One sister has what sounds like lung cancer metastatic to the brain, in her 7051s One maternal cousin was diagnosed with Ms. to have been breast cancer metastatic to the lung in her 7588s  SOCIAL HISTORY:  The patient retired from [employer] than 20 years ago.  He is very active in the [employer] ministry.   His wife NaBonnita Nasutis an education major but mostly worked as a housewife.  Daughter Michael Allen a "mom", and sign language interpreter as well as very active in the naturopath movement.  [place] for the [employer] The patient has 2 grandchildren   ADVANCED DIRECTIVES: Please note patient would refuse a blood transfusion even for lifesaving reasons (discussed 12/27/2017)   HEALTH MAINTENANCE: Social  History   Tobacco Use  . 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 Smokeless tobacco: Never Used  . Tobacco comment: age [age]Substance Use Topics  . Alcohol use: Yes    Comment: occ beer  . Drug use: Never     Colonoscopy: 2015  PSA:  Bone density:   Allergies  Allergen Reactions  . Penicillins Other (See Comments)    Did it involve swelling of the face/tongue/throat, SOB, or low BP? Yes Did it involve sudden or severe rash/hives, skin peeling, or any reaction on the inside of your mouth or nose? Yes Did you need to seek medical attention at a hospital or doctor's office? Yes When did it last happen?2005 If all above answers are "NO", may proceed with cephalosporin use.   . Lyndle Herrlichpecific Substance     NO BLOOD  PRODUCTS   . Oxycodone Other (See Comments)    Made pt "looney" per wife   . Shellfish Allergy Hives    Shell fish and shrimp    No current outpatient medications on file.   No current facility-administered medications for this visit.    OBJECTIVE: white man who appears well   Vitals:   07/07/19 1426  BP: 114/69  Pulse: 69  Resp: 20  Temp: 98.3 F (36.8 C)  SpO2: 95%     Body mass index is 24.47 kg/m.   Wt Readings from Last 3 Encounters:  07/07/19 190 lb 9.6 oz (86.5 kg)  12/19/18 187 lb 8 oz (85 kg)  11/18/18 186 lb (84.4 kg)   ECOG FS:0 - Asymptomatic  Sclerae unicteric, EOMs intact Wearing a mask No cervical or supraclavicular adenopathy, no inguinal or axillary adenopathy Lungs no rales or rhonchi Heart regular rate and rhythm Abd soft, nontender, positive bowel sounds Scrotal exam: Remaining testicle atrophic, no masses palpated MSK no focal spinal tenderness, no upper extremity lymphedema Neuro: nonfocal, well oriented, appropriate affect Breasts: Deferred   LAB RESULTS:  CMP     Component Value Date/Time   NA 140 07/07/2019 1409   K 3.8 07/07/2019 1409   CL 106 07/07/2019 1409   CO2 27 07/07/2019 1409   GLUCOSE 91  07/07/2019 1409   BUN 17 07/07/2019 1409   CREATININE 0.90 07/07/2019 1409   CREATININE 0.74 03/11/2018 1205   CALCIUM 9.0 07/07/2019 1409   PROT 6.7 07/07/2019 1409   ALBUMIN 3.9 07/07/2019 1409   AST 24 07/07/2019 1409   AST 15 03/11/2018 1205   ALT 16 07/07/2019 1409   ALT 14 03/11/2018 1205   ALKPHOS 77 07/07/2019 1409   BILITOT 0.5 07/07/2019 1409   BILITOT 0.8 03/11/2018 1205   GFRNONAA >60 07/07/2019 1409   GFRNONAA >60 03/11/2018 1205   GFRAA >60 07/07/2019 1409   GFRAA >60 03/11/2018 1205    No results found for: TOTALPROTELP, ALBUMINELP, A1GS, A2GS, BETS, BETA2SER, GAMS, MSPIKE, SPEI  No results found for: Nils Pyle, [facility]  Lab Results  Component Value Date   WBC 6.3 07/07/2019   NEUTROABS 3.6 07/07/2019   HGB 13.8 07/07/2019   HCT 42.2 07/07/2019   MCV 95.3 07/07/2019   PLT 175 07/07/2019      Chemistry      Component Value Date/Time   NA 140 07/07/2019 1409   K 3.8 07/07/2019 1409   CL 106 07/07/2019 1409   CO2 27 07/07/2019 1409   BUN 17 07/07/2019 1409   CREATININE 0.90 07/07/2019 1409   CREATININE 0.74 03/11/2018 1205      Component Value Date/Time   CALCIUM 9.0 07/07/2019 1409   ALKPHOS 77 07/07/2019 1409   AST 24 07/07/2019 1409   AST 15 03/11/2018 1205   ALT 16 07/07/2019 1409   ALT 14 03/11/2018 1205   BILITOT 0.5 07/07/2019 1409   BILITOT 0.8 03/11/2018 1205       No results found for: LABCA2  No components found for: SVXBLT903  No results for input(s): INR in the last 168 hours.  No results found for: LABCA2  No results found for: ESP233  No results found for: AQT622  No results found for: QJF354  No results found for: CA2729  No components found for: HGQUANT  No results found for: CEA1 / No results found for: CEA1   No results found for: AFPTUMOR  No results found for: [place]  No results found for: PSA1  Appointment on 07/07/2019  Component Date Value Ref Range Status  . Sodium  07/07/2019 140  135 - 145 mmol/L Final  . Potassium 07/07/2019 3.8  3.5 - 5.1 mmol/L Final  . Chloride 07/07/2019 106  98 - 111 mmol/L Final  . CO2 07/07/2019 27  22 - 32 mmol/L Final  . Glucose, Bld 07/07/2019 91  70 - 99 mg/dL Final   Glucose reference range applies only to samples taken after fasting for at least 8 hours.  . BUN 07/07/2019 17  8 - 23 mg/dL Final  . Creatinine, Ser 07/07/2019 0.90  0.61 - 1.24 mg/dL Final  . Calcium 07/07/2019 9.0  8.9 - 10.3 mg/dL Final  . Total Protein 07/07/2019 6.7  6.5 - 8.1 g/dL Final  . Albumin 07/07/2019 3.9  3.5 - 5.0 g/dL Final  . AST 07/07/2019 24  15 - 41 U/L Final  . ALT 07/07/2019 16  0 - 44 U/L Final  . Alkaline Phosphatase 07/07/2019 77  38 - 126 U/L Final  . Total Bilirubin 07/07/2019 0.5  0.3 - 1.2 mg/dL Final  . GFR calc non Af Amer 07/07/2019 >60  >60 mL/min Final  . GFR calc Af Amer 07/07/2019 >60  >60 mL/min Final  . Anion gap 07/07/2019 7  5 - 15 Final   Performed at [facility], [place] [address], [place], [place] [address]  . WBC 07/07/2019 6.3  4.0 - 10.5 K/uL Final  . RBC 07/07/2019 4.43  4.22 - 5.81 MIL/uL Final  . Hemoglobin 07/07/2019 13.8  13.0 - 17.0 g/dL Final  . HCT 07/07/2019 42.2  39.0 - 52.0 % Final  . MCV 07/07/2019 95.3  80.0 - 100.0 fL Final  . MCH 07/07/2019 31.2  26.0 - 34.0 pg Final  . MCHC 07/07/2019 32.7  30.0 - 36.0 g/dL Final  . RDW 07/07/2019 13.1  11.5 - 15.5 % Final  . Platelets 07/07/2019 175  150 - 400 K/uL Final  . nRBC 07/07/2019 0.0  0.0 - 0.2 % Final  . Neutrophils Relative % 07/07/2019 57  % Final  . Neutro Abs 07/07/2019 3.6  1.7 - 7.7 K/uL Final  . Lymphocytes Relative 07/07/2019 32  % Final  . Lymphs Abs 07/07/2019 2.0  0.7 - 4.0 K/uL Final  . Monocytes Relative 07/07/2019 9  % Final  . Monocytes Absolute 07/07/2019 0.6  0.1 - 1.0 K/uL Final  . Eosinophils Relative 07/07/2019 1  % Final  . Eosinophils Absolute 07/07/2019 0.1  0.0 - 0.5 K/uL Final  . Basophils  Relative 07/07/2019 1  % Final  . Basophils Absolute 07/07/2019 0.0  0.0 - 0.1 K/uL Final  . Immature Granulocytes 07/07/2019 0  % Final  . Abs Immature Granulocytes 07/07/2019 0.01  0.00 - 0.07 K/uL Final   Performed at [facility], [place] [address] [place], [place] [address]    (this displays the last labs from the last 3 days)  No results found for: TOTALPROTELP, ALBUMINELP, A1GS, A2GS, BETS, BETA2SER, GAMS, MSPIKE, SPEI (this displays SPEP labs)  No results found for: KPAFRELGTCHN, LAMBDASER, KAPLAMBRATIO (kappa/lambda light chains)  No results found for: HGBA, HGBA2QUANT, HGBFQUANT, HGBSQUAN (Hemoglobinopathy evaluation)   Lab Results  Component Value Date   LDH 171 10/20/2018    No results found for: IRON, TIBC, IRONPCTSAT (Iron and TIBC)  No results found for: FERRITIN  Urinalysis No results found for: COLORURINE, APPEARANCEUR, LABSPEC, PHURINE, GLUCOSEU, HGBUR, BILIRUBINUR, KETONESUR, PROTEINUR, UROBILINOGEN, NITRITE, LEUKOCYTESUR   STUDIES:  No results found.   ELIGIBLE FOR AVAILABLE RESEARCH PROTOCOL: no  ASSESSMENT: 74 y.o. Jehovah's Witness status post left inguinal orchiectomy 12/20/2017 for a diffuse large cell non-Hodgkin's lymphoma, germinal center B-cell subtype, CD20 positive, CD5 negative.  (1) staging studies: (a) PET scan 12/31/2017: Uptake only in the right testicle and panniculus, both of questionable significance (b) bone marrow biopsy 01/01/2018 shows no evidence of lymphoma (c) lumbar puncture 02/27/2018 benign  (2) international prognostic index: 2 (age, LDH 1 week post-op)  = 80% predicted PFS4; stage I   (3) adjuvant chemotherapy: cyclophosphamide, doxorubicin, vincristine, prednisone, rituximab   (a) hep B surface Ag and cor Ab negative OCT 2019  (b) received 3 cycles of CHOP-R started 01/16/2018, completed 03/03/2018  (4) adjuvant radiation 04/02/18-04/25/18 Site/dose:  Right Testicle/Scrotum, 18 fractions of  1.8 Gy for a total of 32.4 Gy  RECURRENT DISEASE?: AUG 2020 (5) PET scan 10/27/2018 shows a left mesenteric lymph node, 2.2 cm, SUV 14.2; questionable increased SUV right testicle without apparent mass  (a) brain MRI 10/27/2018 no evidence of metastatic disease  (b) scrotal ultrasound on 11/22/2018 shows a normal right testicle, no suspicious masses  (c) laparoscopy 11/18/2018 retrieved an area thought to be the "hot" lymph node, proved to be fat necrosis, with no malignancy and also no nodal tissue identified  (d) repeat PET scan 12/16/2018 shows the "hot lymph node" still in place, but smaller, with decreased SUV (originally 1.5 cm/ 7.3SUV)  (e) PET 03/09/2019 shows the hypermetabolic "lymph node" now to measure 0.4 mm, with an SUV max of 7.3  (f) brain MRI  (g) repeat PET scan   PLAN: Rahmir is now a year and a half out from definitive surgery for his testicular lymphoma, with no evidence of disease recurrence.  This is favorable.  He will have his brain MRI in May.  We discussed whether to do a PET scan now and later in the year but after some discussion decided to only do it in October.  He has absolutely no symptoms and his lab work today is absolutely perfectly normal.  Of course the longer we wait between scans the more information we get  I commended his exercise program.  He will be seeing Dr. [name] sometime in the summer and discuss whether there is mild prostate hypertrophy causing his minimal urinary changes  [place] knows to call us if any new developments occur.  Otherwise he will see me again in November.  Total encounter time 25 minutes.*   Parneet Glantz, [name], MD  07/07/19 2:59 PM Medical Oncology and Hematology [facility] [place], [place] [address] Tel. [phone]    Fax. [phone]    I, Wilburn Mylar, am acting as scribe for Dr. [name]. Santhiago Collingsworth.  I, Lurline Del MD, have reviewed the above documentation for accuracy and  completeness, and I agree with the above.   *Total Encounter Time as defined by the Centers for Medicare and Medicaid Services includes, in addition to the face-to-face time of a patient visit (documented in the note above) non-face-to-face time: obtaining and reviewing outside history, ordering and reviewing medications, tests or procedures, care coordination (communications with other health care professionals or caregivers) and documentation in the medical record.

## 2019-07-07 ENCOUNTER — Inpatient Hospital Stay: Payer: Medicare Other

## 2019-07-07 ENCOUNTER — Other Ambulatory Visit: Payer: Self-pay

## 2019-07-07 ENCOUNTER — Inpatient Hospital Stay: Payer: Medicare Other | Attending: Oncology | Admitting: Oncology

## 2019-07-07 VITALS — BP 114/69 | HR 69 | Temp 98.3°F | Resp 20 | Ht 74.0 in | Wt 190.6 lb

## 2019-07-07 DIAGNOSIS — N200 Calculus of kidney: Secondary | ICD-10-CM

## 2019-07-07 DIAGNOSIS — K5730 Diverticulosis of large intestine without perforation or abscess without bleeding: Secondary | ICD-10-CM

## 2019-07-07 DIAGNOSIS — Z803 Family history of malignant neoplasm of breast: Secondary | ICD-10-CM | POA: Diagnosis not present

## 2019-07-07 DIAGNOSIS — J8410 Pulmonary fibrosis, unspecified: Secondary | ICD-10-CM

## 2019-07-07 DIAGNOSIS — N1330 Unspecified hydronephrosis: Secondary | ICD-10-CM

## 2019-07-07 DIAGNOSIS — C8330 Diffuse large B-cell lymphoma, unspecified site: Secondary | ICD-10-CM | POA: Diagnosis not present

## 2019-07-07 DIAGNOSIS — Z87891 Personal history of nicotine dependence: Secondary | ICD-10-CM | POA: Insufficient documentation

## 2019-07-07 DIAGNOSIS — I700 Atherosclerosis of aorta: Secondary | ICD-10-CM

## 2019-07-07 DIAGNOSIS — C8599 Non-Hodgkin lymphoma, unspecified, extranodal and solid organ sites: Secondary | ICD-10-CM

## 2019-07-07 DIAGNOSIS — Z85820 Personal history of malignant melanoma of skin: Secondary | ICD-10-CM | POA: Insufficient documentation

## 2019-07-07 DIAGNOSIS — Z8572 Personal history of non-Hodgkin lymphomas: Secondary | ICD-10-CM | POA: Diagnosis present

## 2019-07-07 LAB — COMPREHENSIVE METABOLIC PANEL
ALT: 16 U/L (ref 0–44)
AST: 24 U/L (ref 15–41)
Albumin: 3.9 g/dL (ref 3.5–5.0)
Alkaline Phosphatase: 77 U/L (ref 38–126)
Anion gap: 7 (ref 5–15)
BUN: 17 mg/dL (ref 8–23)
CO2: 27 mmol/L (ref 22–32)
Calcium: 9 mg/dL (ref 8.9–10.3)
Chloride: 106 mmol/L (ref 98–111)
Creatinine, Ser: 0.9 mg/dL (ref 0.61–1.24)
GFR calc Af Amer: >60 mL/min (ref >60)
GFR calc non Af Amer: >60 mL/min (ref >60)
Glucose, Bld: 91 mg/dL (ref 70–99)
Potassium: 3.8 mmol/L (ref 3.5–5.1)
Sodium: 140 mmol/L (ref 135–145)
Total Bilirubin: 0.5 mg/dL (ref 0.3–1.2)
Total Protein: 6.7 g/dL (ref 6.5–8.1)

## 2019-07-07 LAB — CBC WITH DIFFERENTIAL/PLATELET
Abs Immature Granulocytes: 0.01 10*3/uL (ref 0.00–0.07)
Basophils Absolute: 0 10*3/uL (ref 0.0–0.1)
Basophils Relative: 1 %
Eosinophils Absolute: 0.1 10*3/uL (ref 0.0–0.5)
Eosinophils Relative: 1 %
HCT: 42.2 % (ref 39.0–52.0)
Hemoglobin: 13.8 g/dL (ref 13.0–17.0)
Immature Granulocytes: 0 %
Lymphocytes Relative: 32 %
Lymphs Abs: 2 10*3/uL (ref 0.7–4.0)
MCH: 31.2 pg (ref 26.0–34.0)
MCHC: 32.7 g/dL (ref 30.0–36.0)
MCV: 95.3 fL (ref 80.0–100.0)
Monocytes Absolute: 0.6 10*3/uL (ref 0.1–1.0)
Monocytes Relative: 9 %
Neutro Abs: 3.6 10*3/uL (ref 1.7–7.7)
Neutrophils Relative %: 57 %
Platelets: 175 10*3/uL (ref 150–400)
RBC: 4.43 MIL/uL (ref 4.22–5.81)
RDW: 13.1 % (ref 11.5–15.5)
WBC: 6.3 10*3/uL (ref 4.0–10.5)
nRBC: 0 % (ref 0.0–0.2)

## 2019-07-07 LAB — LACTATE DEHYDROGENASE: LDH: 168 U/L (ref 98–192)

## 2019-07-08 ENCOUNTER — Telehealth: Payer: Self-pay | Admitting: Oncology

## 2019-07-08 ENCOUNTER — Encounter: Payer: Self-pay | Admitting: Oncology

## 2019-07-08 LAB — BETA 2 MICROGLOBULIN, SERUM: Beta-2 Microglobulin: 1.1 mg/L (ref 0.6–2.4)

## 2019-07-08 LAB — TESTOSTERONE: Testosterone: 366 ng/dL (ref 264–916)

## 2019-07-08 NOTE — Telephone Encounter (Signed)
Scheduled appts per 4/27 los. Pt's wife confirmed appt date and time.

## 2019-08-05 ENCOUNTER — Ambulatory Visit (HOSPITAL_COMMUNITY)
Admission: RE | Admit: 2019-08-05 | Discharge: 2019-08-05 | Disposition: A | Discharge status: Home / Self Care | Payer: Medicare Other | Source: Ambulatory Visit | Attending: Oncology | Admitting: Oncology

## 2019-08-05 ENCOUNTER — Other Ambulatory Visit: Payer: Self-pay

## 2019-08-05 DIAGNOSIS — J8410 Pulmonary fibrosis, unspecified: Secondary | ICD-10-CM | POA: Insufficient documentation

## 2019-08-05 DIAGNOSIS — C8599 Non-Hodgkin lymphoma, unspecified, extranodal and solid organ sites: Secondary | ICD-10-CM | POA: Insufficient documentation

## 2019-08-05 DIAGNOSIS — C8330 Diffuse large B-cell lymphoma, unspecified site: Secondary | ICD-10-CM | POA: Diagnosis present

## 2019-08-05 DIAGNOSIS — N1330 Unspecified hydronephrosis: Secondary | ICD-10-CM | POA: Insufficient documentation

## 2019-08-05 DIAGNOSIS — I700 Atherosclerosis of aorta: Secondary | ICD-10-CM

## 2019-08-05 MED ORDER — GADOBUTROL 1 MMOL/ML IV SOLN
9.0000 mL | Freq: Once | INTRAVENOUS | Status: AC | PRN
  Administered 2019-08-05: 9 mL via INTRAVENOUS

## 2019-08-07 ENCOUNTER — Other Ambulatory Visit: Payer: Self-pay | Admitting: Oncology

## 2019-08-07 NOTE — Progress Notes (Signed)
I called Jenny Reichmann and [name] and gave them the good news of the brain scan

## 2019-12-31 ENCOUNTER — Ambulatory Visit (HOSPITAL_COMMUNITY)
Admission: RE | Admit: 2019-12-31 | Discharge: 2019-12-31 | Disposition: A | Discharge status: Home / Self Care | Payer: Medicare Other | Source: Ambulatory Visit | Attending: Oncology | Admitting: Oncology

## 2019-12-31 ENCOUNTER — Other Ambulatory Visit: Payer: Self-pay

## 2019-12-31 DIAGNOSIS — N1330 Unspecified hydronephrosis: Secondary | ICD-10-CM | POA: Insufficient documentation

## 2019-12-31 DIAGNOSIS — I700 Atherosclerosis of aorta: Secondary | ICD-10-CM | POA: Diagnosis not present

## 2019-12-31 DIAGNOSIS — C8599 Non-Hodgkin lymphoma, unspecified, extranodal and solid organ sites: Secondary | ICD-10-CM | POA: Diagnosis not present

## 2019-12-31 DIAGNOSIS — J8410 Pulmonary fibrosis, unspecified: Secondary | ICD-10-CM | POA: Insufficient documentation

## 2019-12-31 DIAGNOSIS — C8330 Diffuse large B-cell lymphoma, unspecified site: Secondary | ICD-10-CM | POA: Insufficient documentation

## 2019-12-31 LAB — GLUCOSE, CAPILLARY: Glucose-Capillary: 100 mg/dL — ABNORMAL HIGH (ref 70–99)

## 2019-12-31 MED ORDER — FLUDEOXYGLUCOSE F - 18 (FDG) INJECTION
8.2000 | Freq: Once | INTRAVENOUS | Status: AC | PRN
  Administered 2019-12-31: 8.2 via INTRAVENOUS

## 2020-01-01 ENCOUNTER — Other Ambulatory Visit: Payer: Self-pay | Admitting: Oncology

## 2020-01-01 NOTE — Progress Notes (Signed)
I called Michael Allen and let him know the skin looks great.

## 2020-01-14 ENCOUNTER — Inpatient Hospital Stay: Payer: Medicare Other

## 2020-01-14 ENCOUNTER — Other Ambulatory Visit: Payer: Self-pay

## 2020-01-14 ENCOUNTER — Inpatient Hospital Stay: Payer: Medicare Other | Attending: Oncology | Admitting: Oncology

## 2020-01-14 VITALS — BP 128/82 | HR 77 | Temp 97.6°F | Resp 17 | Ht 74.0 in | Wt 189.4 lb

## 2020-01-14 DIAGNOSIS — C8599 Non-Hodgkin lymphoma, unspecified, extranodal and solid organ sites: Secondary | ICD-10-CM

## 2020-01-14 DIAGNOSIS — C8330 Diffuse large B-cell lymphoma, unspecified site: Secondary | ICD-10-CM

## 2020-01-14 DIAGNOSIS — Z87891 Personal history of nicotine dependence: Secondary | ICD-10-CM | POA: Insufficient documentation

## 2020-01-14 DIAGNOSIS — J8410 Pulmonary fibrosis, unspecified: Secondary | ICD-10-CM

## 2020-01-14 DIAGNOSIS — Z85828 Personal history of other malignant neoplasm of skin: Secondary | ICD-10-CM | POA: Diagnosis not present

## 2020-01-14 DIAGNOSIS — N1330 Unspecified hydronephrosis: Secondary | ICD-10-CM

## 2020-01-14 DIAGNOSIS — Z923 Personal history of irradiation: Secondary | ICD-10-CM | POA: Insufficient documentation

## 2020-01-14 DIAGNOSIS — N200 Calculus of kidney: Secondary | ICD-10-CM

## 2020-01-14 DIAGNOSIS — Z8572 Personal history of non-Hodgkin lymphomas: Secondary | ICD-10-CM | POA: Diagnosis not present

## 2020-01-14 DIAGNOSIS — Z9221 Personal history of antineoplastic chemotherapy: Secondary | ICD-10-CM | POA: Diagnosis not present

## 2020-01-14 DIAGNOSIS — K5730 Diverticulosis of large intestine without perforation or abscess without bleeding: Secondary | ICD-10-CM

## 2020-01-14 DIAGNOSIS — I700 Atherosclerosis of aorta: Secondary | ICD-10-CM

## 2020-01-14 LAB — COMPREHENSIVE METABOLIC PANEL
ALT: 14 U/L (ref 0–44)
AST: 22 U/L (ref 15–41)
Albumin: 4.2 g/dL (ref 3.5–5.0)
Alkaline Phosphatase: 77 U/L (ref 38–126)
Anion gap: 6 (ref 5–15)
BUN: 16 mg/dL (ref 8–23)
CO2: 28 mmol/L (ref 22–32)
Calcium: 9.2 mg/dL (ref 8.9–10.3)
Chloride: 106 mmol/L (ref 98–111)
Creatinine, Ser: 0.83 mg/dL (ref 0.61–1.24)
GFR, Estimated: >60 mL/min (ref >60)
Glucose, Bld: 93 mg/dL (ref 70–99)
Potassium: 4.1 mmol/L (ref 3.5–5.1)
Sodium: 140 mmol/L (ref 135–145)
Total Bilirubin: 0.5 mg/dL (ref 0.3–1.2)
Total Protein: 7.3 g/dL (ref 6.5–8.1)

## 2020-01-14 LAB — CBC WITH DIFFERENTIAL/PLATELET
Abs Immature Granulocytes: 0.02 10*3/uL (ref 0.00–0.07)
Basophils Absolute: 0 10*3/uL (ref 0.0–0.1)
Basophils Relative: 0 %
Eosinophils Absolute: 0.1 10*3/uL (ref 0.0–0.5)
Eosinophils Relative: 1 %
HCT: 43.1 % (ref 39.0–52.0)
Hemoglobin: 14.2 g/dL (ref 13.0–17.0)
Immature Granulocytes: 0 %
Lymphocytes Relative: 31 %
Lymphs Abs: 2.1 10*3/uL (ref 0.7–4.0)
MCH: 30.2 pg (ref 26.0–34.0)
MCHC: 32.9 g/dL (ref 30.0–36.0)
MCV: 91.7 fL (ref 80.0–100.0)
Monocytes Absolute: 0.7 10*3/uL (ref 0.1–1.0)
Monocytes Relative: 11 %
Neutro Abs: 3.9 10*3/uL (ref 1.7–7.7)
Neutrophils Relative %: 57 %
Platelets: 194 10*3/uL (ref 150–400)
RBC: 4.7 MIL/uL (ref 4.22–5.81)
RDW: 13.6 % (ref 11.5–15.5)
WBC: 6.9 10*3/uL (ref 4.0–10.5)
nRBC: 0 % (ref 0.0–0.2)

## 2020-01-14 LAB — LACTATE DEHYDROGENASE: LDH: 178 U/L (ref 98–192)

## 2020-01-14 NOTE — Progress Notes (Signed)
[place]  [phone] Fax:([phone]) [phone]   ID: Michael Allen DOB: 07-08-1945  MR#: [id number]  WNU#:[id number]  Patient Care Team: Seward Carol, MD as PCP - General (Internal Medicine) [name], MD as Consulting Physician (Urology) Aleeyah Bensen, [name], MD as Consulting Physician (Oncology) [name], MD as Consulting Physician (Ophthalmology) [name], MD as Consulting Physician (Radiation Oncology) Alphonsa Overall, MD as Consulting Physician (General Surgery) [name], MD OTHER MD:   CHIEF COMPLAINT: Diffuse large cell non-Hodgkin's lymphoma, germinal center B-cell subtype  CURRENT TREATMENT: Observation   INTERVAL HISTORY: Qais returns today for follow up of his diffuse large cell B-cell non-Hodgkin's lymphoma. His wife [name] was not able to come today partly because of concerns regarding the coronavirus and partly because she is having some foot problems which keep her from getting around as much as she would like.  Since his last visit, Ethyn underwent restaging brain MRI on 08/05/2019 showing: no metastatic disease or acute intracranial abnormality.  He also underwent restaging PET scan on 12/31/2019 showing: complete metabolic response to therapy without residual hypermetabolic activity or lymphadenopathy; no new or progressive disease.  Lab Results  Component Value Date   LDH 178 01/14/2020   LDH 168 07/07/2019   LDH 171 10/20/2018   LDH 165 04/29/2018   LDH 163 03/11/2018   Lab Results  Component Value Date   TESTOSTERONE 366 07/07/2019   TESTOSTERONE 476 10/20/2018   TESTOSTERONE 630 03/03/2018    REVIEW OF SYSTEMS:  Breandan and his wife are being very careful regarding the pandemic. Mostly they stay home. They get the groceries at curbside. He tells me that about 99% of Jehovah's Witnesses have been vaccinated and that they have fairly strict rules as to who can travel where and how to quarantine when they get  back from a trip. His son from [place] was able to spend a few days with them and he enjoyed that. He is taking walks although not as many as he would like and doing some yard work. He has had no fevers drenching sweats weight loss unexplained fatigue adenopathy confusion nausea vomiting visual changes or problems with balance. Overall a detailed review of systems was benign   COVID 19 VACCINATION STATUS: fully vaccinated [employer]) with booster in October 2021   HISTORY OF CURRENT ILLNESS: From the original intake note 12/27/2017:  "The patient was referred to Dr. [name] for renal stones.  Incidentally he was noted to have left scrotal swelling.  This had been present several months and was initially felt to be a hydrocele.  Scrotal ultrasound was performed 12/06/2017.  This found no spermatocele but a solid mass in the left testis upper and lower pole.  Accordingly on 12/20/2017 the patient underwent left inguinal orchiectomy.  The pathology from this procedure (SZ [phone]) showed a diffuse large B cell non-Hodgkin's lymphoma, CD20 positive, CD10 weakly positive, no coexpression of CD5, and most consistent with a germinal center B-cell subtype.  I was called by Dr. [name] earlier today and the patient was brought in for further evaluation."  His subsequent history is as detailed below.   PAST MEDICAL HISTORY: Past Medical History:  Diagnosis Date  . Chalazion   . Dental crowns present   . GERD (gastroesophageal reflux disease)   . History of kidney stones 10/25/2017   right nonobstructing stone noted on CT ABD/Pelvis  . Hydronephrosis 10/25/2017   Mild to moderate right due to 78m stone right distal, noted on CT abd/pelvis  .  Hydroureter, right 10/25/2017   20m stone right distal, noted on CT abd/pelvis  . Impaired fasting glucose   . Migraine    optical  . Plantar fasciitis 2003  . PONV (postoperative nausea and vomiting)    light  . Pure hyperglyceridemia   . Rectal  bleeding 2002  . Recurrent spontaneous pneumothorax    due to blebsx2  . Skin cancer of scalp 2011  . Transfusion of blood product refused for religious reason   . Ureteral stone 10/25/2017   479mstone right distal, noted on CT abd/pelvis  . Wears glasses     PAST SURGICAL HISTORY: Past Surgical History:  Procedure Laterality Date  . CHALAZION EXCISION Right    cyst removal , right eye  . COLONOSCOPY    [id number]  . INGUINAL HERNIA REPAIR Bilateral 2005   wire mesh  . LAPAROSCOPIC REMOVAL ABDOMINAL MASS N/A 11/18/2018   Procedure: LAPAROSCOPIC EXPLORATION FOR MESENTERIC LYMPH NODE;  Surgeon: NeAlphonsa OverallMD;  Location: [facility];  Service: General;  Laterality: N/A;  . LUNG SURGERY     x2  . ORCHIECTOMY Left 12/20/2017   Procedure: ORCHIECTOMY;  Surgeon: DaFranchot GalloMD;  [facility] Service: Urology;  Laterality: Left;  . TONSILLECTOMY      FAMILY HISTORY Family History  Problem Relation Age of Onset  . Dementia Mother        progressive  . Breast cancer Mother   . Heart Problems Father        pacemaker  . CAD Father   . Heart disease Father    The patient's father died from heart and lung problems in his 8089s The patient's mother had breast cancer in her 5025sdied in her 9016srom Alzheimer's disease.  The patient had no brothers, 2 sisters.  One sister has what sounds like lung cancer metastatic to the brain, in her 7048s One maternal cousin was diagnosed with Ms. to have been breast cancer metastatic to the lung in her 5069s  SOCIAL HISTORY:  The patient retired from [employer] than 20 years ago.  He is very active in the [employer] ministry.   His wife NaBonnita Nasutis an education major but mostly worked as a housewife.  Daughter [name] a "mom", and sign language interpreter as well as very active in the naturopath movement.  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 Smokeless tobacco: Never Used  . Tobacco comment: age 89Vaping Use  . Vaping Use: Never used  Substance Use Topics  . Alcohol use: Yes    Comment: occ beer  . Drug use: Never     Colonoscopy: 2015  PSA:  Bone density:   Allergies  Allergen Reactions  . Penicillins Other (See Comments)    Did it involve swelling of the face/tongue/throat, SOB, or low BP? Yes Did it involve sudden or severe rash/hives, skin peeling, or any reaction on the inside of your mouth or nose? Yes Did you need to seek medical attention at a hospital or doctor's office? Yes When did it last happen?2005 If all above answers are "NO", may proceed with  cephalosporin use.   Lyndle Herrlich Specific Substance     NO BLOOD PRODUCTS   . Oxycodone Other (See Comments)    Made pt "looney" per wife   . Shellfish Allergy Hives    Shell fish and shrimp    No current outpatient medications on file.   No current facility-administered medications for this visit.    OBJECTIVE: white man in no acute distress  Vitals:   01/14/20 1315  BP: 128/82  Pulse: 77  Resp: 17  Temp: 97.6 F (36.4 C)  SpO2: 99%     Body mass index is 24.32 kg/m.   Wt Readings from Last 3 Encounters:  01/14/20 189 lb 6.4 oz (85.9 kg)  12/31/19 184 lb (83.5 kg)  07/07/19 190 lb 9.6 oz (86.5 kg)   ECOG FS:1 - Symptomatic but completely ambulatory  Sclerae unicteric, EOMs intact Wearing a mask No cervical or supraclavicular adenopathy Lungs no rales or rhonchi Heart regular rate and rhythm Abd soft, nontender, positive bowel sounds Pelvic exam: No pelvic adenopathy, atrophic single testicle, no scrotal mass MSK no focal spinal tenderness, no upper extremity  lymphedema Neuro: nonfocal, well oriented, appropriate affect   LAB RESULTS:  CMP     Component Value Date/Time   NA 140 01/14/2020 1248   K 4.1 01/14/2020 1248   CL 106 01/14/2020 1248   CO2 28 01/14/2020 1248   GLUCOSE 93 01/14/2020 1248   BUN 16 01/14/2020 1248   CREATININE 0.83 01/14/2020 1248   CREATININE 0.74 03/11/2018 1205   CALCIUM 9.2 01/14/2020 1248   PROT 7.3 01/14/2020 1248   ALBUMIN 4.2 01/14/2020 1248   AST 22 01/14/2020 1248   AST 15 03/11/2018 1205   ALT 14 01/14/2020 1248   ALT 14 03/11/2018 1205   ALKPHOS 77 01/14/2020 1248   BILITOT 0.5 01/14/2020 1248   BILITOT 0.8 03/11/2018 1205   GFRNONAA >60 01/14/2020 1248   GFRNONAA >60 03/11/2018 1205   GFRAA >60 07/07/2019 1409   GFRAA >60 03/11/2018 1205    No results found for: TOTALPROTELP, ALBUMINELP, A1GS, A2GS, BETS, BETA2SER, GAMS, MSPIKE, SPEI  No results found for: Nils Pyle, [facility]  Lab Results  Component Value Date   WBC 6.9 01/14/2020   NEUTROABS 3.9 01/14/2020   HGB 14.2 01/14/2020   HCT 43.1 01/14/2020   MCV 91.7 01/14/2020   PLT 194 01/14/2020      Chemistry      Component Value Date/Time   NA 140 01/14/2020 1248   K 4.1 01/14/2020 1248   CL 106 01/14/2020 1248   CO2 28 01/14/2020 1248   BUN 16 01/14/2020 1248   CREATININE 0.83 01/14/2020 1248   CREATININE 0.74 03/11/2018 1205      Component Value Date/Time   CALCIUM 9.2 01/14/2020 1248   ALKPHOS 77 01/14/2020 1248   AST 22 01/14/2020 1248   AST 15 03/11/2018 1205   ALT 14 01/14/2020 1248   ALT 14 03/11/2018 1205   BILITOT 0.5 01/14/2020 1248   BILITOT 0.8 03/11/2018 1205       No results found for: LABCA2  No components found for: [id number]  No results for input(s): INR in the last 168 hours.  No results found for: LABCA2  No results found for: JIZ128  No results found for: FVW867  No results found for: RJP366  No results found for: CA2729  No components found for: HGQUANT  No  results found for: CEA1 / No results found for: CEA1   No results found  for: AFPTUMOR  No results found for: CHROMOGRNA  No results found for: PSA1  No results found for: TOTALPROTELP, ALBUMINELP, A1GS, A2GS, BETS, BETA2SER, GAMS, MSPIKE, SPEI (this displays SPEP labs)  No results found for: KPAFRELGTCHN, LAMBDASER, KAPLAMBRATIO (kappa/lambda light chains)  No results found for: HGBA, HGBA2QUANT, HGBFQUANT, HGBSQUAN (Hemoglobinopathy evaluation)   Lab Results  Component Value Date   LDH 178 01/14/2020    No results found for: IRON, TIBC, IRONPCTSAT (Iron and TIBC)  No results found for: FERRITIN  Urinalysis No results found for: COLORURINE, APPEARANCEUR, LABSPEC, PHURINE, GLUCOSEU, HGBUR, BILIRUBINUR, KETONESUR, PROTEINUR, UROBILINOGEN, NITRITE, LEUKOCYTESUR   STUDIES: NM PET Image Restag (PS) Skull Base To Thigh  Result Date: 01/01/2020 CLINICAL DATA:  Subsequent treatment strategy for diffuse large cell non-Hodgkin's lymphoma. EXAM: NUCLEAR MEDICINE PET SKULL BASE TO THIGH TECHNIQUE: 9.2 mCi F-18 FDG was injected intravenously. Full-ring PET imaging was performed from the skull base to thigh after the radiotracer. CT data was obtained and used for attenuation correction and anatomic localization. Fasting blood glucose: 100 mg/dl COMPARISON:  PET-CT 03/09/2019 and 12/16/2018 FINDINGS: Mediastinal blood pool activity: SUV max 2.6 Liver activity: SUV max 4.0 NECK: No hypermetabolic cervical lymph nodes are identified.There are no lesions of the pharyngeal mucosal space. Activity associated with the muscles of phonation, within physiologic limits. Incidental CT findings: none CHEST: There are no hypermetabolic mediastinal, hilar or axillary lymph nodes. No hypermetabolic pulmonary activity or suspicious nodularity. Incidental CT findings: Calcified subpleural granuloma in the right upper lobe, calcified right paratracheal node and mild coronary artery atherosclerosis noted.  ABDOMEN/PELVIS: There is no hypermetabolic activity within the liver, adrenal glands, spleen or pancreas. There is no hypermetabolic nodal activity. Specifically, the previously demonstrated small hypermetabolic left mesenteric node is no longer seen. There is stable soft tissue stranding in the mesenteric fat without progressive adenopathy. Incidental CT findings: Stable incidental findings including multiple hepatic cysts, a gastric fundal diverticulum, nonobstructing right renal calculi, sigmoid diverticulosis and moderate prostatomegaly. Previous left orchiectomy. SKELETON: There is no hypermetabolic activity to suggest osseous metastatic disease. Incidental CT findings: Chronic bilateral rib deformities. IMPRESSION: 1. Complete metabolic response to therapy without residual hypermetabolic activity or lymphadenopathy. [place] 1. 2. No new or progressive disease. 3. Stable incidental findings as detailed above. Electronically Signed   By: Richardean Sale M.D.   On: 01/01/2020 09:18     ELIGIBLE FOR AVAILABLE RESEARCH PROTOCOL: no  ASSESSMENT: 74 y.o. Jehovah's Witness status post left inguinal orchiectomy 12/20/2017 for a diffuse large cell non-Hodgkin's lymphoma, germinal center B-cell subtype, CD20 positive, CD5 negative.  (1) staging studies: (a) PET scan 12/31/2017: Uptake only in the right testicle and panniculus, both of questionable significance (b) bone marrow biopsy 01/01/2018 shows no evidence of lymphoma (c) lumbar puncture 02/27/2018 benign  (2) international prognostic index: 2 (age, LDH 1 week post-op)  = 80% predicted PFS4; stage I   (3) adjuvant chemotherapy: cyclophosphamide, doxorubicin, vincristine, prednisone, rituximab   (a) hep B surface Ag and cor Ab negative OCT 2019  (b) received 3 cycles of CHOP-R started 01/16/2018, completed 03/03/2018  (4) adjuvant radiation 04/02/18-04/25/18 Site/dose:  Right Testicle/Scrotum, 18 fractions of 1.8 Gy for a total of 32.4  Gy  RECURRENT DISEASE?: AUG 2020 (5) PET scan 10/27/2018 shows a left mesenteric lymph node, 2.2 cm, SUV 14.2; questionable increased SUV right testicle without apparent mass  (a) brain MRI 10/27/2018 no evidence of metastatic disease  (b) scrotal ultrasound on 11/22/2018 shows a normal right testicle, no suspicious masses  (c) laparoscopy 11/18/2018  retrieved an area thought to be the "hot" lymph node, proved to be fat necrosis, with no malignancy and also no nodal tissue identified  (d) repeat PET scan 12/16/2018 shows the "hot lymph node" still in place, but smaller, with decreased SUV (originally 1.5 cm/ 7.3SUV)  (e) PET 03/09/2019 shows the hypermetabolic "lymph node" now to measure 0.4 mm, with an SUV max of 7.3  (f) brain MRI 08/05/2019 shows no evidence of disease  (g) repeat PET scan 01/01/2020 completely negative   PLAN: Sabre is now 2 years out from completion of therapy for his testicular diffuse large cell non-Hodgkin's lymphoma. His PET scan is entirely negative which is very gratifying. He had a brain MRI May 2021 which was likewise benign.  At this point I am going to start following him clinically. He will see me again in May 2022 for exam and lab work only.  I commended his exercise plan and suggested he could walk a little bit more frequently and a little bit longer each time. Unfortunately [name] is limited right now because of her foot issues and he may need to go on his own. They also are being very proactive in avoiding infection with the coronavirus  Total encounter time 25 minutes.*    Roslyn Else, [name], MD  01/14/20 2:45 PM Medical Oncology and Hematology [facility] [place], [place] [address] Tel. [phone]    Fax. [phone]    I, Wilburn Mylar, am acting as scribe for Dr. [name]. Latashia Koch.  I, Lurline Del MD, have reviewed the above documentation for accuracy and completeness, and I agree with the  above.   *Total Encounter Time as defined by the Centers for Medicare and Medicaid Services includes, in addition to the face-to-face time of a patient visit (documented in the note above) non-face-to-face time: obtaining and reviewing outside history, ordering and reviewing medications, tests or procedures, care coordination (communications with other health care professionals or caregivers) and documentation in the medical record.

## 2020-01-15 LAB — BETA 2 MICROGLOBULIN, SERUM: Beta-2 Microglobulin: 1.2 mg/L (ref 0.6–2.4)

## 2020-01-15 LAB — TESTOSTERONE: Testosterone: 396 ng/dL (ref 264–916)

## 2020-08-03 NOTE — Progress Notes (Signed)
Michael Allen  [phone] [phone]   ID: Michael Allen DOB: 02-02-1946  MR#: [id number]  KDX#:[id number]  Patient Care Team: Seward Carol, MD as PCP - General (Internal Medicine) [name], MD as Consulting Physician (Urology) Nalin Mazzocco, [name], MD as Consulting Physician (Oncology) [name], MD as Consulting Physician (Ophthalmology) [name], MD as Consulting Physician (Radiation Oncology) Alphonsa Overall, MD as Consulting Physician (General Surgery) [name], MD OTHER MD:   CHIEF COMPLAINT: Diffuse large cell non-Hodgkin's lymphoma, germinal center B-cell subtype  CURRENT TREATMENT: Observation   INTERVAL HISTORY: Michael Allen returns today for follow up of his diffuse large cell B-cell non-Hodgkin's lymphoma. He continues under observation.  Since his last visit, he has not undergone any additional studies.  Lab Results  Component Value Date   LDH 188 08/04/2020   LDH 178 01/14/2020   LDH 168 07/07/2019   LDH 171 10/20/2018   LDH 165 04/29/2018   Lab Results  Component Value Date   TESTOSTERONE 396 01/14/2020   TESTOSTERONE 366 07/07/2019   TESTOSTERONE 476 10/20/2018   TESTOSTERONE 630 03/03/2018    REVIEW OF SYSTEMS:  Michael Allen is not walking as much as he used to since his wife Michael Allen had some foot problems but he gets at least a mile and a half most days he says.  He has had no unusual headaches, visual changes, nausea, vomiting, balance issues or falls.  He is has not had fever, rash, bleeding, unexplained fatigue or unexplained weight loss or any adenopathy.  His wife thinks he is a little bit forgetful.  She thinks he might be a little bit more tired than usual.  He has developed some rosacea and minimal nocturia.  A detailed review of systems today was otherwise stable   COVID 19 VACCINATION STATUS: fully vaccinated [employer]) with 2 boosters as of May 2022   HISTORY OF CURRENT ILLNESS: From the original intake  note 12/27/2017:  "The patient was referred to Dr. [name] for renal stones.  Incidentally he was noted to have left scrotal swelling.  This had been present several months and was initially felt to be a hydrocele.  Scrotal ultrasound was performed 12/06/2017.  This found no spermatocele but a solid mass in the left testis upper and lower pole.  Accordingly on 12/20/2017 the patient underwent left inguinal orchiectomy.  The pathology from this procedure (SZ [id number]) showed a diffuse large B cell non-Hodgkin's lymphoma, CD20 positive, CD10 weakly positive, no coexpression of CD5, and most consistent with a germinal center B-cell subtype.  I was called by Dr. [name] earlier today and the patient was brought in for further evaluation."  His subsequent history is as detailed below.   PAST MEDICAL HISTORY: Past Medical History:  Diagnosis Date  . Chalazion   . Dental crowns present   . GERD (gastroesophageal reflux disease)   . History of kidney stones 10/25/2017   right nonobstructing stone noted on CT ABD/Pelvis  . Hydronephrosis 10/25/2017   Mild to moderate right due to 58m stone right distal, noted on CT abd/pelvis  . Hydroureter, right 10/25/2017   475mstone right distal, noted on CT abd/pelvis  . Impaired fasting glucose   . Migraine    optical  . Plantar fasciitis 2003  . PONV (postoperative nausea and vomiting)    light  . Pure hyperglyceridemia   . Rectal bleeding 2002  . Recurrent spontaneous pneumothorax    due to blebsx2  . Skin cancer of scalp 2011  .  Transfusion of blood product refused for religious reason   . Ureteral stone 10/25/2017   39m stone right distal, noted on CT abd/pelvis  . Wears glasses     PAST SURGICAL HISTORY: Past Surgical History:  Procedure Laterality Date  . CHALAZION EXCISION Right    cyst removal , right eye  . COLONOSCOPY    . [place]  . INGUINAL HERNIA REPAIR Bilateral 2005   wire mesh  . LAPAROSCOPIC REMOVAL  ABDOMINAL MASS N/A 11/18/2018   Procedure: LAPAROSCOPIC EXPLORATION FOR MESENTERIC LYMPH NODE;  Surgeon: NAlphonsa Overall MD;  Location: [facility];  Service: General;  Laterality: N/A;  . LUNG SURGERY     x2  . ORCHIECTOMY Left 12/20/2017   Procedure: ORCHIECTOMY;  Surgeon: DFranchot Gallo MD;  [facility]  Service: Urology;  Laterality: Left;  . TONSILLECTOMY      FAMILY HISTORY Family History  Problem Relation Age of Onset  . Dementia Mother        progressive  . Breast cancer Mother   . Heart Problems Father        pacemaker  . CAD Father   . Heart disease Father   The patient's father died from heart and lung problems in his 852s  The patient's mother had breast cancer in her 535s died in her 987sfrom Alzheimer's disease.  The patient had no brothers, 2 sisters.  One sister has what sounds like lung cancer metastatic to the brain, in her 77s  One maternal cousin was diagnosed with Ms. to have been breast cancer metastatic to the lung in her 567s   SOCIAL HISTORY:  The patient retired from [employer] than 20 years ago.  He is very active in the [employer].   His wife NBonnita Nasutiis an education major but mostly worked as a housewife.  Daughter BEustaquio Maizeis a "mom", and sign language interpreter as well as very active in the naturopath movement.  [place] in [place]  [place] for the [employer]  The patient has 2 grandchildren   ADVANCED DIRECTIVES: Please note patient would refuse a blood transfusion even for lifesaving reasons (discussed 12/27/2017)   HEALTH MAINTENANCE: Social History   Tobacco Use  . 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 Smokeless tobacco: Never Used  . Tobacco comment: age 75 Vaping Use  . Vaping Use: Never used  Substance Use Topics  . Alcohol use: Yes    Comment: occ beer  . Drug use: Never     Colonoscopy: 2015  PSA:  Bone density:   Allergies  Allergen Reactions  .  Penicillins Other (See Comments)    Did it involve swelling of the face/tongue/throat, SOB, or low BP? Yes Did it involve sudden or severe rash/hives, skin peeling, or any reaction on the inside of your mouth or nose? Yes Did you need to seek medical attention at a hospital or doctor's office? Yes When did it last happen?2005 If all above answers are "NO", may proceed with cephalosporin use.   .Lyndle HerrlichSpecific Substance     NO BLOOD PRODUCTS   . Oxycodone Other (See Comments)    Made pt "looney" per wife   . Shellfish Allergy Hives    Shell fish and shrimp    No current outpatient medications on file.   No current facility-administered medications for this visit.    OBJECTIVE: white man in no acute distress  Vitals:  08/04/20 1200  BP: 131/79  Pulse: 75  Resp: 18  Temp: 97.6 F (36.4 C)  SpO2: 98%     Body mass index is 23.47 kg/m.   Wt Readings from Last 3 Encounters:  08/04/20 182 lb 12.8 oz (82.9 kg)  01/14/20 189 lb 6.4 oz (85.9 kg)  12/31/19 184 lb (83.5 kg)   ECOG FS:1 - Symptomatic but completely ambulatory  Sclerae unicteric, EOMs intact Wearing a mask No cervical or supraclavicular adenopathy, no axillary or inguinal adenopathy Lungs no rales or rhonchi Heart regular rate and rhythm Abd soft, nontender, positive bowel sounds No scrotal mass, no mass in the left testicle MSK no focal spinal tenderness, no upper extremity lymphedema Neuro: nonfocal, well oriented, appropriate affect   LAB RESULTS:  CMP     Component Value Date/Time   NA 140 08/04/2020 1140   K 4.4 08/04/2020 1140   CL 106 08/04/2020 1140   CO2 25 08/04/2020 1140   GLUCOSE 95 08/04/2020 1140   BUN 15 08/04/2020 1140   CREATININE 0.77 08/04/2020 1140   CREATININE 0.74 03/11/2018 1205   CALCIUM 9.3 08/04/2020 1140   PROT 7.1 08/04/2020 1140   ALBUMIN 3.9 08/04/2020 1140   AST 26 08/04/2020 1140   AST 15 03/11/2018 1205   ALT 16 08/04/2020 1140   ALT 14 03/11/2018  1205   ALKPHOS 71 08/04/2020 1140   BILITOT 0.7 08/04/2020 1140   BILITOT 0.8 03/11/2018 1205   GFRNONAA >60 08/04/2020 1140   GFRNONAA >60 03/11/2018 1205   GFRAA >60 07/07/2019 1409   GFRAA >60 03/11/2018 1205    No results found for: TOTALPROTELP, ALBUMINELP, A1GS, A2GS, BETS, BETA2SER, GAMS, MSPIKE, SPEI  No results found for: Nils Pyle, [facility]  Lab Results  Component Value Date   WBC 6.6 08/04/2020   NEUTROABS 3.6 08/04/2020   HGB 14.2 08/04/2020   HCT 42.5 08/04/2020   MCV 92.0 08/04/2020   PLT 218 08/04/2020      Chemistry      Component Value Date/Time   NA 140 08/04/2020 1140   K 4.4 08/04/2020 1140   CL 106 08/04/2020 1140   CO2 25 08/04/2020 1140   BUN 15 08/04/2020 1140   CREATININE 0.77 08/04/2020 1140   CREATININE 0.74 03/11/2018 1205      Component Value Date/Time   CALCIUM 9.3 08/04/2020 1140   ALKPHOS 71 08/04/2020 1140   AST 26 08/04/2020 1140   AST 15 03/11/2018 1205   ALT 16 08/04/2020 1140   ALT 14 03/11/2018 1205   BILITOT 0.7 08/04/2020 1140   BILITOT 0.8 03/11/2018 1205       No results found for: LABCA2  No components found for: QMGQQP619  No results for input(s): INR in the last 168 hours.  No results found for: LABCA2  No results found for: JKD326  No results found for: ZTI458  No results found for: KDX833  No results found for: CA2729  No components found for: HGQUANT  No results found for: CEA1 / No results found for: CEA1   No results found for: AFPTUMOR  No results found for: CHROMOGRNA  No results found for: PSA1  No results found for: TOTALPROTELP, ALBUMINELP, A1GS, A2GS, BETS, BETA2SER, GAMS, MSPIKE, SPEI (this displays SPEP labs)  No results found for: KPAFRELGTCHN, LAMBDASER, KAPLAMBRATIO (kappa/lambda light chains)  No results found for: HGBA, HGBA2QUANT, HGBFQUANT, HGBSQUAN (Hemoglobinopathy evaluation)   Lab Results  Component Value Date   LDH 188 08/04/2020    No results  found for: IRON, TIBC, IRONPCTSAT (Iron and TIBC)  No results found for: FERRITIN  Urinalysis No results found for: COLORURINE, APPEARANCEUR, LABSPEC, PHURINE, GLUCOSEU, HGBUR, BILIRUBINUR, KETONESUR, PROTEINUR, UROBILINOGEN, NITRITE, LEUKOCYTESUR   STUDIES: No results found.   ELIGIBLE FOR AVAILABLE RESEARCH PROTOCOL: no  ASSESSMENT: 75 y.o. Jehovah's Witness status post left inguinal orchiectomy 12/20/2017 for a diffuse large cell non-Hodgkin's lymphoma, germinal center B-cell subtype, CD20 positive, CD5 negative.  (1) staging studies: (a) PET scan 12/31/2017: Uptake only in the right testicle and panniculus, both of questionable significance (b) bone marrow biopsy 01/01/2018 shows no evidence of lymphoma (c) lumbar puncture 02/27/2018 benign  (2) international prognostic index: 2 (age, LDH 1 week post-op)  = 80% predicted PFS4; stage I   (3) adjuvant chemotherapy: cyclophosphamide, doxorubicin, vincristine, prednisone, rituximab   (a) hep B surface Ag and cor Ab negative OCT 2019  (b) received 3 cycles of CHOP-R started 01/16/2018, completed 03/03/2018  (4) adjuvant radiation 04/02/18-04/25/18 Site/dose:  Right Testicle/Scrotum, 18 fractions of 1.8 Gy for a total of 32.4 Gy  DISEASE MONITORING: AUG 2020 (5) PET scan 10/27/2018 shows a left mesenteric lymph node, 2.2 cm, SUV 14.2; questionable increased SUV right testicle without apparent mass  (a) brain MRI 10/27/2018 no evidence of metastatic disease  (b) scrotal ultrasound on 11/22/2018 shows a normal right testicle, no suspicious masses  (c) laparoscopy 11/18/2018 retrieved an area thought to be the "hot" lymph node, proved to be fat necrosis, with no malignancy and also no nodal tissue identified  (d) repeat PET scan 12/16/2018 shows the "hot lymph node" still in place, but smaller, with decreased SUV (originally 1.5 cm/ 7.3SUV)  (e) PET 03/09/2019 shows the hypermetabolic "lymph node" now to measure 0.4 mm, with an SUV max  of 7.3  (f) brain MRI 08/05/2019 shows no evidence of disease  (g) repeat PET scan 01/01/2020 completely negative   PLAN: Michael Allen is now 2-1/2 years out from completion of his treatment, with no evidence of lymphoma activity.  This is very favorable.  We reviewed the usual symptoms of mild cognitive deficits which are normal after the age of 40.  Certainly there is no evidence or suggestion of dementia.  As far as energy is concerned the more active he is the more energy he will have so I encouraged him to try to walk 3 miles a day instead of 1-1/2.  For his rosacea I wrote him a prescription for MetroGel.  We described nocturia issues and I think his situation does not yet warrant the addition of Flomax or similar  I will see him again in 6 months for labs and physical exam.  He knows to call for any other issue that may develop before the next visit  Total encounter time 25 minutes.*   Roy Snuffer, [name], MD  08/04/20 2:07 PM Medical Oncology and Hematology [facility] [place], [place] [address] Tel. [phone]    Fax. [phone]    I, Wilburn Mylar, am acting as scribe for Dr. [name]. Michael Allen.  I, Lurline Del MD, have reviewed the above documentation for accuracy and completeness, and I agree with the above.   *Total Encounter Time as defined by the Centers for Medicare and Medicaid Services includes, in addition to the face-to-face time of a patient visit (documented in the note above) non-face-to-face time: obtaining and reviewing outside history, ordering and reviewing medications, tests or procedures, care coordination (communications with other health care professionals or caregivers) and documentation in the  medical record.

## 2020-08-04 ENCOUNTER — Other Ambulatory Visit: Payer: Self-pay

## 2020-08-04 ENCOUNTER — Telehealth: Payer: Self-pay | Admitting: Oncology

## 2020-08-04 ENCOUNTER — Inpatient Hospital Stay: Payer: Medicare Other | Attending: Oncology | Admitting: Oncology

## 2020-08-04 ENCOUNTER — Inpatient Hospital Stay: Payer: Medicare Other

## 2020-08-04 VITALS — BP 131/79 | HR 75 | Temp 97.6°F | Resp 18 | Ht 74.0 in | Wt 182.8 lb

## 2020-08-04 DIAGNOSIS — Z803 Family history of malignant neoplasm of breast: Secondary | ICD-10-CM | POA: Insufficient documentation

## 2020-08-04 DIAGNOSIS — Z923 Personal history of irradiation: Secondary | ICD-10-CM | POA: Diagnosis not present

## 2020-08-04 DIAGNOSIS — J8410 Pulmonary fibrosis, unspecified: Secondary | ICD-10-CM

## 2020-08-04 DIAGNOSIS — C8330 Diffuse large B-cell lymphoma, unspecified site: Secondary | ICD-10-CM

## 2020-08-04 DIAGNOSIS — Z8572 Personal history of non-Hodgkin lymphomas: Secondary | ICD-10-CM | POA: Diagnosis not present

## 2020-08-04 DIAGNOSIS — R351 Nocturia: Secondary | ICD-10-CM | POA: Insufficient documentation

## 2020-08-04 DIAGNOSIS — C8599 Non-Hodgkin lymphoma, unspecified, extranodal and solid organ sites: Secondary | ICD-10-CM | POA: Diagnosis not present

## 2020-08-04 DIAGNOSIS — L719 Rosacea, unspecified: Secondary | ICD-10-CM | POA: Insufficient documentation

## 2020-08-04 DIAGNOSIS — Z9221 Personal history of antineoplastic chemotherapy: Secondary | ICD-10-CM | POA: Insufficient documentation

## 2020-08-04 DIAGNOSIS — Z87891 Personal history of nicotine dependence: Secondary | ICD-10-CM | POA: Insufficient documentation

## 2020-08-04 DIAGNOSIS — I700 Atherosclerosis of aorta: Secondary | ICD-10-CM

## 2020-08-04 DIAGNOSIS — N1330 Unspecified hydronephrosis: Secondary | ICD-10-CM

## 2020-08-04 DIAGNOSIS — N200 Calculus of kidney: Secondary | ICD-10-CM

## 2020-08-04 DIAGNOSIS — K5730 Diverticulosis of large intestine without perforation or abscess without bleeding: Secondary | ICD-10-CM

## 2020-08-04 LAB — CBC WITH DIFFERENTIAL/PLATELET
Abs Immature Granulocytes: 0.01 10*3/uL (ref 0.00–0.07)
Basophils Absolute: 0 10*3/uL (ref 0.0–0.1)
Basophils Relative: 1 %
Eosinophils Absolute: 0.1 10*3/uL (ref 0.0–0.5)
Eosinophils Relative: 1 %
HCT: 42.5 % (ref 39.0–52.0)
Hemoglobin: 14.2 g/dL (ref 13.0–17.0)
Immature Granulocytes: 0 %
Lymphocytes Relative: 33 %
Lymphs Abs: 2.2 10*3/uL (ref 0.7–4.0)
MCH: 30.7 pg (ref 26.0–34.0)
MCHC: 33.4 g/dL (ref 30.0–36.0)
MCV: 92 fL (ref 80.0–100.0)
Monocytes Absolute: 0.7 10*3/uL (ref 0.1–1.0)
Monocytes Relative: 10 %
Neutro Abs: 3.6 10*3/uL (ref 1.7–7.7)
Neutrophils Relative %: 55 %
Platelets: 218 10*3/uL (ref 150–400)
RBC: 4.62 MIL/uL (ref 4.22–5.81)
RDW: 13.6 % (ref 11.5–15.5)
WBC: 6.6 10*3/uL (ref 4.0–10.5)
nRBC: 0 % (ref 0.0–0.2)

## 2020-08-04 LAB — COMPREHENSIVE METABOLIC PANEL
ALT: 16 U/L (ref 0–44)
AST: 26 U/L (ref 15–41)
Albumin: 3.9 g/dL (ref 3.5–5.0)
Alkaline Phosphatase: 71 U/L (ref 38–126)
Anion gap: 9 (ref 5–15)
BUN: 15 mg/dL (ref 8–23)
CO2: 25 mmol/L (ref 22–32)
Calcium: 9.3 mg/dL (ref 8.9–10.3)
Chloride: 106 mmol/L (ref 98–111)
Creatinine, Ser: 0.77 mg/dL (ref 0.61–1.24)
GFR, Estimated: >60 mL/min (ref >60)
Glucose, Bld: 95 mg/dL (ref 70–99)
Potassium: 4.4 mmol/L (ref 3.5–5.1)
Sodium: 140 mmol/L (ref 135–145)
Total Bilirubin: 0.7 mg/dL (ref 0.3–1.2)
Total Protein: 7.1 g/dL (ref 6.5–8.1)

## 2020-08-04 LAB — LACTATE DEHYDROGENASE: LDH: 188 U/L (ref 98–192)

## 2020-08-04 MED ORDER — METRONIDAZOLE 0.75 % EX GEL: 1.0000 | Freq: Two times a day (BID) | CUTANEOUS | 0 refills | Status: DC

## 2020-08-04 NOTE — Telephone Encounter (Signed)
Scheduled appointment per 05/26 los. Patient is aware. 

## 2020-08-05 LAB — TESTOSTERONE: Testosterone: 336 ng/dL (ref 264–916)

## 2020-08-06 LAB — BETA 2 MICROGLOBULIN, SERUM: Beta-2 Microglobulin: 1.3 mg/L (ref 0.6–2.4)

## 2020-08-09 IMAGING — MR MR HEAD WO/W CM
14 of 15 series · 42 of 48 positions shown · IV contrast (gadavist)
Comparison: PET-CT 03/09/2019.  Brain MRI 10/27/2018.

CLINICAL DATA: 73-year-old male with diffuse large B-cell lymphoma,
lymphoma of the left testis. Restaging.

EXAM:
MRI HEAD WITHOUT AND WITH CONTRAST
TECHNIQUE: Multiplanar, multiecho pulse sequences of the brain and surrounding
structures were obtained without and with intravenous contrast.
CONTRAST:  9mL GADAVIST GADOBUTROL 1 MMOL/ML IV SOLN

[Series 5: DWI · axial · 3.0mm · 1.36mm/px · z∈[-27,+125]mm · 5 of 104 slices shown (1 of 4)]
[im 1/104]
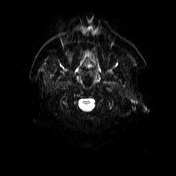
[im 26/104]
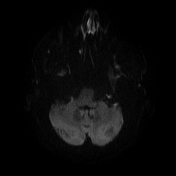
[im 52/104]
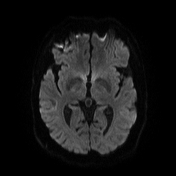
[im 78/104]
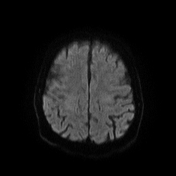
[im 104/104]
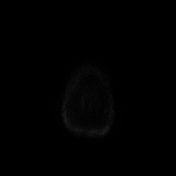

[Series 6: DWI · axial · 3.0mm · 1.36mm/px · z∈[-27,+125]mm · 3 of 52 slices shown (2 of 4)]
[im 1/52]
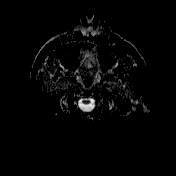
[im 26/52]
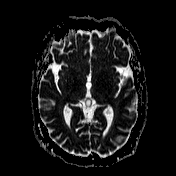
[im 52/52]
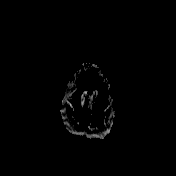

[Series 7: T1 · sagittal · 5.0mm · 0.75mm/px · 1 of 24 slices shown (1 of 2)]
[im 1/24]
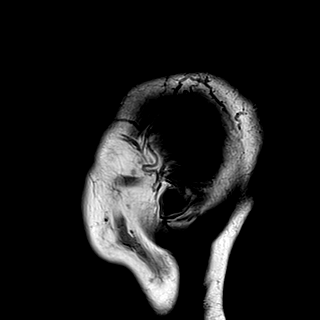

[Series 8: T2 · axial · 5.0mm · 0.62mm/px · 1 of 24 slices shown]
[im 1/24]
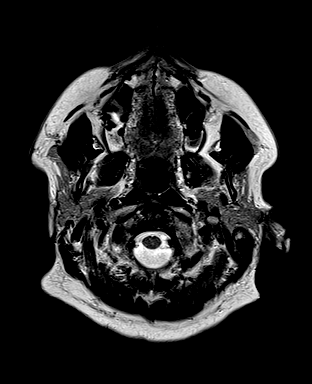

[Series 9: mip_images(sw) · axial · 24.0mm · 0.75mm/px · z∈[-14,+117]mm · 2 of 45 slices shown]
[im 1/45]
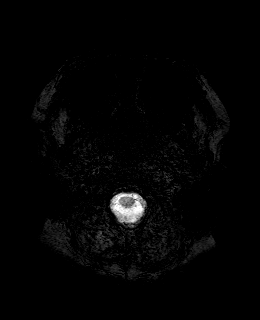
[im 45/45]
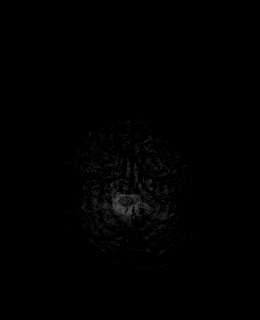

[Series 10: swi_images · axial · 3.0mm · 0.75mm/px · z∈[-24,+128]mm · 3 of 52 slices shown]
[im 1/52]
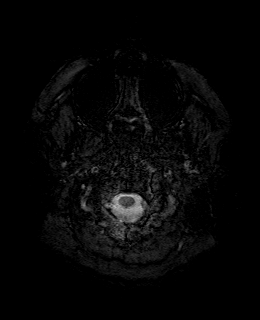
[im 26/52]
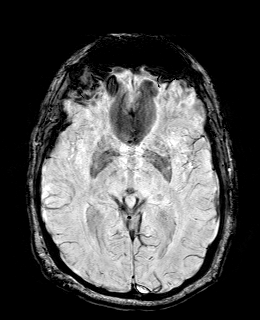
[im 52/52]
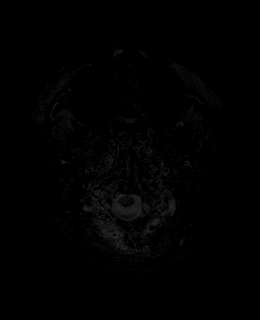

[Series 11: FLAIR · axial · 3.0mm · 0.75mm/px · z∈[-24,+128]mm · 3 of 52 slices shown]
[im 1/52]
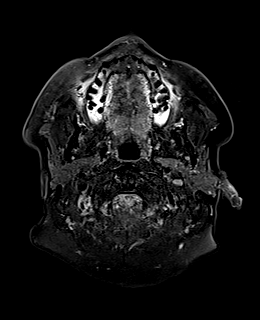
[im 26/52]
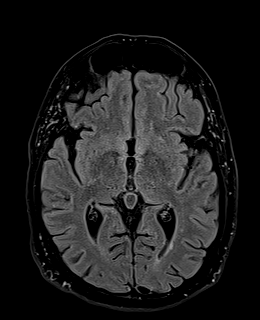
[im 52/52]
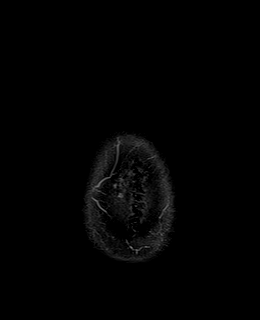

[Series 12: T1 · axial · 1.0mm · 0.94mm/px · z∈[-27,+131]mm · 8 of 160 slices shown (2 of 2)]
[im 1/160]
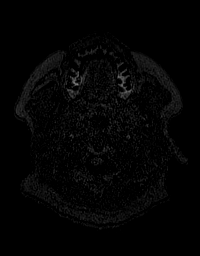
[im 23/160]
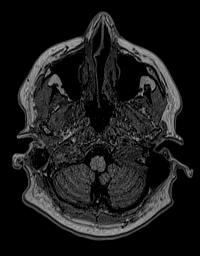
[im 46/160]
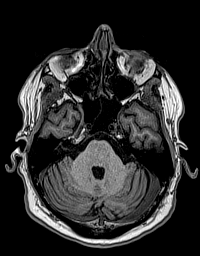
[im 69/160]
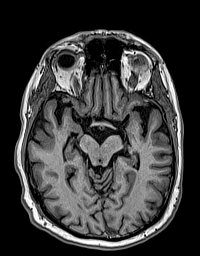
[im 91/160]
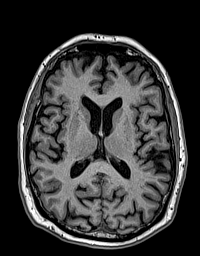
[im 114/160]
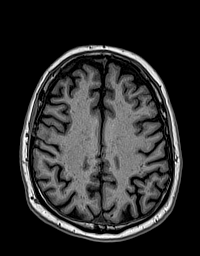
[im 137/160]
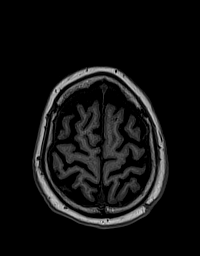
[im 160/160]
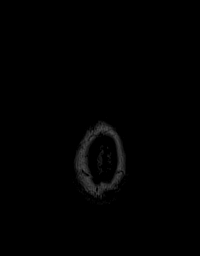

[Series 13: DWI · coronal · 5.0mm · 1.31mm/px · 3 of 64 slices shown (3 of 4)]
[im 1/64]
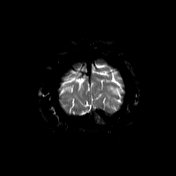
[im 32/64]
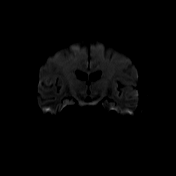
[im 64/64]
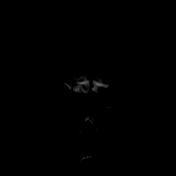

[Series 14: DWI · coronal · 5.0mm · 1.31mm/px · 2 of 32 slices shown (4 of 4)]
[im 1/32]
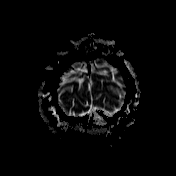
[im 32/32]
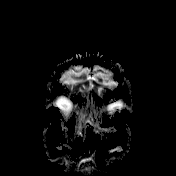

[Series 15: T2 post-contrast · coronal · 5.0mm · 0.57mm/px · 1 of 26 slices shown]
[im 1/26]
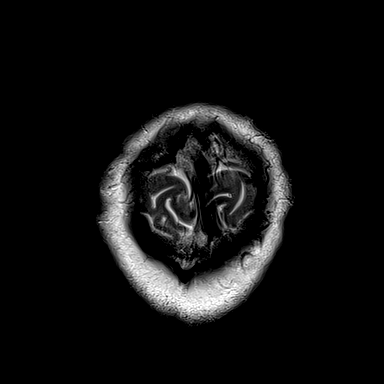

[Series 16: T1 post-contrast · axial · 1.0mm · 0.94mm/px · z∈[-27,+131]mm · 8 of 160 slices shown (1 of 3)]
[im 1/160]
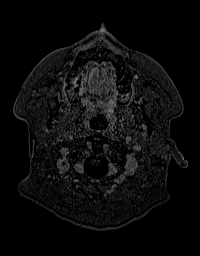
[im 23/160]
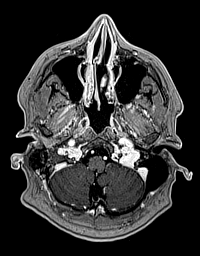
[im 46/160]
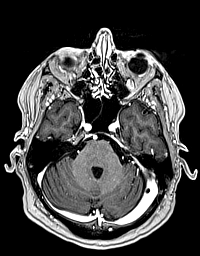
[im 69/160]
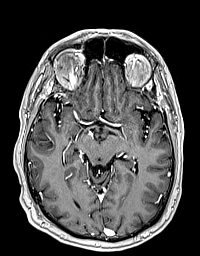
[im 91/160]
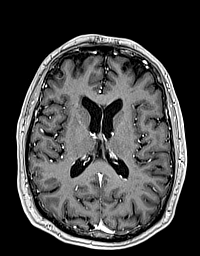
[im 114/160]
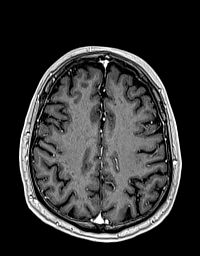
[im 137/160]
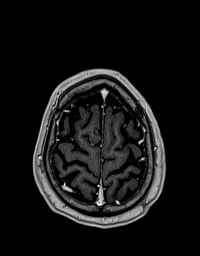
[im 160/160]
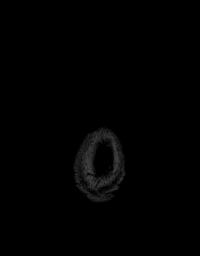

[Series 17: T1 post-contrast · coronal · 5.0mm · 0.43mm/px · 1 of 26 slices shown (2 of 3)]
[im 1/26]
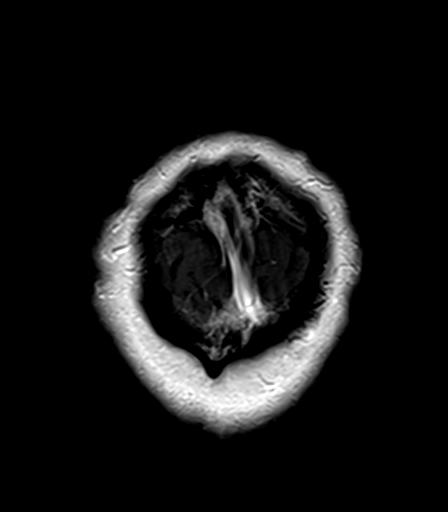

[Series 18: T1 post-contrast · sagittal · 5.0mm · 0.75mm/px · 1 of 24 slices shown (3 of 3)]
[im 1/24]
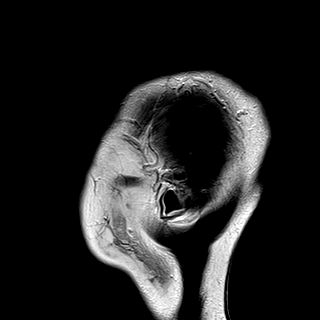

[42 of 48 positions shown; findings below may reference images not displayed]

FINDINGS: Brain: Stable small pineal cyst, normal variant. Stable cerebral
volume. No restricted diffusion to suggest acute infarction. No
midline shift, mass effect, ventriculomegaly, extra-axial collection
or acute intracranial hemorrhage. Cervicomedullary junction and
pituitary are within normal limits.

Stable cerebral gray and white matter signal since last year with
mild for age scattered nonspecific T2 and FLAIR hyperintensity. No
cortical encephalomalacia or chronic cerebral blood products
identified.

Following contrast there is no acute or suspicious intracranial
enhancement. No dural thickening.

However, there is a stable small oval 10 mm enhancing soft tissue
mass within the left internal auditory canal (series 16, image 39).
This is compatible with a small chronic vestibular schwannoma, and
unchanged since [REDACTED]. The remaining visible bilateral internal
auditory structures appear within normal limits. Left mastoids
remain clear.

Vascular: Major intracranial vascular flow voids are stable. The
major dural venous sinuses are enhancing and appear to be patent.

Skull and upper cervical spine: Negative for age visible cervical
spine and spinal cord. Visualized bone marrow signal is within
normal limits.

Sinuses/Orbits: Negative orbits.  Paranasal sinuses remain clear.

Other: Trace right mastoid fluid is unchanged. Scalp and face soft
tissues appear negative.
IMPRESSION: 1.  No metastatic disease or acute intracranial abnormality.

2. There is a small chronic Vestibular Schwannoma of the left
internal auditory canal.

## 2020-08-10 DIAGNOSIS — H2513 Age-related nuclear cataract, bilateral: Secondary | ICD-10-CM | POA: Diagnosis not present

## 2020-08-10 DIAGNOSIS — H524 Presbyopia: Secondary | ICD-10-CM | POA: Diagnosis not present

## 2020-08-10 DIAGNOSIS — G453 Amaurosis fugax: Secondary | ICD-10-CM | POA: Diagnosis not present

## 2020-08-10 DIAGNOSIS — H35373 Puckering of macula, bilateral: Secondary | ICD-10-CM | POA: Diagnosis not present

## 2020-08-16 ENCOUNTER — Other Ambulatory Visit: Payer: Self-pay | Admitting: Ophthalmology

## 2020-08-16 DIAGNOSIS — G453 Amaurosis fugax: Secondary | ICD-10-CM

## 2020-08-29 ENCOUNTER — Ambulatory Visit
Admission: RE | Admit: 2020-08-29 | Discharge: 2020-08-29 | Disposition: A | Discharge status: Home / Self Care | Payer: Medicare Other | Source: Ambulatory Visit | Attending: Ophthalmology | Admitting: Ophthalmology

## 2020-08-29 DIAGNOSIS — G453 Amaurosis fugax: Secondary | ICD-10-CM

## 2020-08-29 DIAGNOSIS — Z8673 Personal history of transient ischemic attack (TIA), and cerebral infarction without residual deficits: Secondary | ICD-10-CM | POA: Diagnosis not present

## 2020-08-29 DIAGNOSIS — I6523 Occlusion and stenosis of bilateral carotid arteries: Secondary | ICD-10-CM | POA: Diagnosis not present

## 2020-08-29 DIAGNOSIS — Z8572 Personal history of non-Hodgkin lymphomas: Secondary | ICD-10-CM | POA: Diagnosis not present

## 2020-09-26 DIAGNOSIS — E7800 Pure hypercholesterolemia, unspecified: Secondary | ICD-10-CM | POA: Diagnosis not present

## 2020-09-26 DIAGNOSIS — H539 Unspecified visual disturbance: Secondary | ICD-10-CM | POA: Diagnosis not present

## 2020-10-27 ENCOUNTER — Other Ambulatory Visit: Payer: Self-pay

## 2020-10-27 ENCOUNTER — Other Ambulatory Visit: Payer: Self-pay | Admitting: Internal Medicine

## 2020-10-27 ENCOUNTER — Ambulatory Visit
Admission: RE | Admit: 2020-10-27 | Discharge: 2020-10-27 | Disposition: A | Discharge status: Home / Self Care | Payer: Medicare Other | Source: Ambulatory Visit | Attending: Internal Medicine | Admitting: Internal Medicine

## 2020-10-27 DIAGNOSIS — R0989 Other specified symptoms and signs involving the circulatory and respiratory systems: Secondary | ICD-10-CM

## 2020-10-27 DIAGNOSIS — E7800 Pure hypercholesterolemia, unspecified: Secondary | ICD-10-CM | POA: Diagnosis not present

## 2020-10-27 DIAGNOSIS — Z1389 Encounter for screening for other disorder: Secondary | ICD-10-CM | POA: Diagnosis not present

## 2020-10-27 DIAGNOSIS — Z5320 Procedure and treatment not carried out because of patient's decision for unspecified reasons: Secondary | ICD-10-CM | POA: Diagnosis not present

## 2020-10-27 DIAGNOSIS — R059 Cough, unspecified: Secondary | ICD-10-CM

## 2020-10-27 DIAGNOSIS — Z0000 Encounter for general adult medical examination without abnormal findings: Secondary | ICD-10-CM | POA: Diagnosis not present

## 2020-10-27 DIAGNOSIS — C8330 Diffuse large B-cell lymphoma, unspecified site: Secondary | ICD-10-CM | POA: Diagnosis not present

## 2020-10-28 DIAGNOSIS — Z5320 Procedure and treatment not carried out because of patient's decision for unspecified reasons: Secondary | ICD-10-CM | POA: Diagnosis not present

## 2021-01-29 NOTE — Progress Notes (Addendum)
[facility]  Telephone:([phone] Fax:([phone]) [phone]   ID: Michael Allen DOB: 12/18/1945  MR#: [id number]  OMG#:[id number]  Patient Care Team: Michael Dills, MD as PCP - General (Internal Medicine) Michael Matar, MD as Consulting Physician (Urology) Michael Allen, Michael Hue, MD as Consulting Physician (Oncology) Michael Contras, MD as Consulting Physician (Ophthalmology) Michael Blackbird, MD as Consulting Physician (Radiation Oncology) Michael Kin, MD as Consulting Physician (General Surgery) Michael Dell, MD OTHER MD:   CHIEF COMPLAINT: Diffuse large cell non-Hodgkin's lymphoma, germinal center B-cell subtype  CURRENT TREATMENT: Observation   INTERVAL HISTORY: Michael Allen returns today for follow up of his diffuse large cell B-cell non-Hodgkin's lymphoma. He continues under observation.  The interval history is generally unremarkable.  He and his wife have been extremely careful regarding COVID and its only in the last few months that he has begun to go to any meetings in person.  Despite that she had COVID recently.  He quarantined himself and tested himself but never did develop it.  Generally he feels "fine".   We are following the LDH and testosterone levels Lab Results  Component Value Date   LDH 273 (H) 01/30/2021   LDH 188 08/04/2020   LDH 178 01/14/2020   LDH 168 07/07/2019   LDH 171 10/20/2018   Lab Results  Component Value Date   TESTOSTERONE 336 08/04/2020   TESTOSTERONE 396 01/14/2020   TESTOSTERONE 366 07/07/2019   TESTOSTERONE 476 10/20/2018   TESTOSTERONE 630 03/03/2018    REVIEW OF SYSTEMS:  Michael Allen tells me he exercises by doing yard work.  He is careful about his weight.  He had been up to 214 pounds in 2009, got it down to about 173 x 2017, went up again and he is now back at 174 but he tells me he does this by simply avoiding snacks.  He has had no fevers, drenching sweats, unexplained fatigue, or palpable adenopathy.  He has had no problems  with urine flow.  A detailed review of systems today was otherwise stable.   COVID 19 VACCINATION STATUS: Pfizer x5 as of November 2022  HISTORY OF CURRENT ILLNESS: From the original intake note 12/27/2017:  "The patient was referred to Dr. Retta Allen for renal stones.  Incidentally he was noted to have left scrotal swelling.  This had been present several months and was initially felt to be a hydrocele.  Scrotal ultrasound was performed 12/06/2017.  This found no spermatocele but a solid mass in the left testis upper and lower pole.  Accordingly on 12/20/2017 the patient underwent left inguinal orchiectomy.  The pathology from this procedure ([id number]) showed a diffuse large B cell non-Hodgkin's lymphoma, CD20 positive, CD10 weakly positive, no coexpression of CD5, and most consistent with a germinal center B-cell subtype.  I was called by Dr. Retta Allen earlier today and the patient was brought in for further evaluation."  His subsequent history is as detailed below.   PAST MEDICAL HISTORY: Past Medical History:  Diagnosis Date   Chalazion    Dental crowns present    GERD (gastroesophageal reflux disease)    History of kidney stones 10/25/2017   right nonobstructing stone noted on CT ABD/Pelvis   Hydronephrosis 10/25/2017   Mild to moderate right due to 17mm stone right distal, noted on CT abd/pelvis   Hydroureter, right 10/25/2017   5mm stone right distal, noted on CT abd/pelvis   Impaired fasting glucose    Migraine    optical   Plantar fasciitis 2003  PONV (postoperative nausea and vomiting)    light   Pure hyperglyceridemia    Rectal bleeding 2002   Recurrent spontaneous pneumothorax    due to blebsx2   Skin cancer of scalp 2011   Transfusion of blood product refused for religious reason    Ureteral stone 10/25/2017   36mm stone right distal, noted on CT abd/pelvis   Wears glasses     PAST SURGICAL HISTORY: Past Surgical History:  Procedure Laterality Date    CHALAZION EXCISION Right    cyst removal , right eye   COLONOSCOPY     HEMORRHOID SURGERY  2002   INGUINAL HERNIA REPAIR Bilateral 2005   wire mesh   LAPAROSCOPIC REMOVAL ABDOMINAL MASS N/A 11/18/2018   Procedure: LAPAROSCOPIC EXPLORATION FOR MESENTERIC LYMPH NODE;  Surgeon: Michael Overall, MD;  Location: [facility];  Service: General;  Laterality: N/A;   LUNG SURGERY     x2   ORCHIECTOMY Left 12/20/2017   Procedure: ORCHIECTOMY;  Surgeon: Michael Gallo, MD;  Location: [facility];  Service: Urology;  Laterality: Left;   TONSILLECTOMY      FAMILY HISTORY Family History  Problem Relation Age of Onset   Dementia Mother        progressive   Breast cancer Mother    Heart Problems Father        pacemaker   CAD Father    Heart disease Father   The patient's father died from heart and lung problems in his 35s.  The patient's mother had breast cancer in her 48s, died in her 82s from Alzheimer's disease.  The patient had no brothers, 2 sisters.  One sister has what sounds like lung cancer metastatic to the brain, in her 24s.  One maternal cousin was diagnosed with Ms. to have been breast cancer metastatic to the lung in her 31s.   SOCIAL HISTORY:  The patient retired from [employer] more than 20 years ago.  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   His wife Michael Allen is an education major but mostly worked as a housewife.  Daughter Michael Allen is a "mom", and sign language interpreter as well as very active in the naturopath movement.  She lives in [place].  Son Michael Allen lives in [place] where he works for the [employer].  The patient has 2 grandchildren   ADVANCED DIRECTIVES: Please note patient would refuse a blood transfusion even for lifesaving reasons (discussed 12/27/2017)   HEALTH MAINTENANCE: Social History   Tobacco Use   Smoking status: Former   Smokeless tobacco: Never   Tobacco comments:    age 54  Vaping Use   Vaping Use: Never used  Substance Use  Topics   Alcohol use: Yes    Comment: occ beer   Drug use: Never     Colonoscopy: 2015  PSA:  Bone density:   Allergies  Allergen Reactions   Penicillins Other (See Comments)    Did it involve swelling of the face/tongue/throat, SOB, or low BP? Yes Did it involve sudden or severe rash/hives, skin peeling, or any reaction on the inside of your mouth or nose? Yes Did you need to seek medical attention at a hospital or doctor's office? Yes When did it last happen?  2005     If all above answers are "NO", may proceed with cephalosporin use.    Blood-Group Specific Substance     NO BLOOD PRODUCTS    Oxycodone Other (See Comments)    Made pt "  looney" per wife    Shellfish Allergy Hives    Shell fish and shrimp    Current Outpatient Medications  Medication Sig Dispense Refill   metroNIDAZOLE (METROGEL) 0.75 % gel Apply 1 application topically 2 (two) times daily. 45 g 0   No current facility-administered medications for this visit.    OBJECTIVE: white man in no acute distress  Vitals:   01/30/21 1226  BP: 128/71  Pulse: 72  Resp: 18  Temp: (!) 97.5 F (36.4 C)  SpO2: 100%     Body mass index is 23.34 kg/m.   Wt Readings from Last 3 Encounters:  01/30/21 181 lb 12.8 oz (82.5 kg)  08/04/20 182 lb 12.8 oz (82.9 kg)  01/14/20 189 lb 6.4 oz (85.9 kg)   ECOG FS:1 - Symptomatic but completely ambulatory  Sclerae unicteric, EOMs intact Wearing a mask No cervical or supraclavicular adenopathy, no axillary or inguinal adenopathy Lungs no rales or rhonchi Heart regular rate and rhythm Abd soft, nontender, positive bowel sounds GU: Atrophic single testicle, no palpable mass MSK no focal spinal tenderness, no upper extremity lymphedema Neuro: nonfocal, well oriented, appropriate affect   LAB RESULTS:  CMP     Component Value Date/Time   NA 140 08/04/2020 1140   K 4.4 08/04/2020 1140   CL 106 08/04/2020 1140   CO2 25 08/04/2020 1140   GLUCOSE 95 08/04/2020 1140    BUN 15 08/04/2020 1140   CREATININE 0.77 08/04/2020 1140   CREATININE 0.74 03/11/2018 1205   CALCIUM 9.3 08/04/2020 1140   PROT 7.1 08/04/2020 1140   ALBUMIN 3.9 08/04/2020 1140   AST 26 08/04/2020 1140   AST 15 03/11/2018 1205   ALT 16 08/04/2020 1140   ALT 14 03/11/2018 1205   ALKPHOS 71 08/04/2020 1140   BILITOT 0.7 08/04/2020 1140   BILITOT 0.8 03/11/2018 1205   GFRNONAA >60 08/04/2020 1140   GFRNONAA >60 03/11/2018 1205   GFRAA >60 07/07/2019 1409   GFRAA >60 03/11/2018 1205    No results found for: TOTALPROTELP, ALBUMINELP, A1GS, A2GS, BETS, BETA2SER, GAMS, MSPIKE, SPEI  No results found for: Nils Pyle, [facility]  Lab Results  Component Value Date   WBC 6.6 08/04/2020   NEUTROABS 3.6 08/04/2020   HGB 14.2 08/04/2020   HCT 42.5 08/04/2020   MCV 92.0 08/04/2020   PLT 218 08/04/2020      Chemistry      Component Value Date/Time   NA 140 08/04/2020 1140   K 4.4 08/04/2020 1140   CL 106 08/04/2020 1140   CO2 25 08/04/2020 1140   BUN 15 08/04/2020 1140   CREATININE 0.77 08/04/2020 1140   CREATININE 0.74 03/11/2018 1205      Component Value Date/Time   CALCIUM 9.3 08/04/2020 1140   ALKPHOS 71 08/04/2020 1140   AST 26 08/04/2020 1140   AST 15 03/11/2018 1205   ALT 16 08/04/2020 1140   ALT 14 03/11/2018 1205   BILITOT 0.7 08/04/2020 1140   BILITOT 0.8 03/11/2018 1205       No results found for: LABCA2  No components found for: AXKPVV748  No results for input(s): INR in the last 168 hours.  No results found for: LABCA2  No results found for: OLM786  No results found for: LJQ492  No results found for: EFE071  No results found for: CA2729  No components found for: HGQUANT  No results found for: CEA1 / No results found for: CEA1   No results found for: AFPTUMOR  No  results found for: [place]  No results found for: PSA1  No results found for: TOTALPROTELP, ALBUMINELP, A1GS, A2GS, BETS, BETA2SER, GAMS, MSPIKE, SPEI (this  displays SPEP labs)  No results found for: KPAFRELGTCHN, LAMBDASER, KAPLAMBRATIO (kappa/lambda light chains)  No results found for: HGBA, HGBA2QUANT, HGBFQUANT, HGBSQUAN (Hemoglobinopathy evaluation)   Lab Results  Component Value Date   LDH 273 (H) 01/30/2021    No results found for: IRON, TIBC, IRONPCTSAT (Iron and TIBC)  No results found for: FERRITIN  Urinalysis No results found for: COLORURINE, APPEARANCEUR, LABSPEC, PHURINE, GLUCOSEU, HGBUR, BILIRUBINUR, KETONESUR, PROTEINUR, UROBILINOGEN, NITRITE, LEUKOCYTESUR   STUDIES: No results found.   ELIGIBLE FOR AVAILABLE RESEARCH PROTOCOL: no  ASSESSMENT: 75 y.o. Jehovah's Witness status post left inguinal orchiectomy 12/20/2017 for a diffuse large cell non-Hodgkin's lymphoma, germinal center B-cell subtype, CD20 positive, CD5 negative.  (1) staging studies: (a) PET scan 12/31/2017: Uptake only in the right testicle and panniculus, both of questionable significance (b) bone marrow biopsy 01/01/2018 shows no evidence of lymphoma (c) lumbar puncture 02/27/2018 benign  (2) international prognostic index: 2 (age, LDH 1 week post-op)  = 80% predicted PFS4; stage I   (3) adjuvant chemotherapy: cyclophosphamide, doxorubicin, vincristine, prednisone, rituximab   (a) hep B surface Ag and cor Ab negative OCT 2019  (b) received 3 cycles of CHOP-R started 01/16/2018, completed 03/03/2018  (4) adjuvant radiation 04/02/18-04/25/18  Site/dose:  Right Testicle/Scrotum, 18 fractions of 1.8 Gy for a total of 32.4 Gy  DISEASE MONITORING: AUG 2020 (5) PET scan 10/27/2018 shows a left mesenteric lymph node, 2.2 cm, SUV 14.2; questionable increased SUV right testicle without apparent mass  (a) brain MRI 10/27/2018 no evidence of metastatic disease  (b) scrotal ultrasound on 11/22/2018 shows a normal right testicle, no suspicious masses  (c) laparoscopy 11/18/2018 retrieved an area thought to be the "hot" lymph node, proved to be fat necrosis,  with no malignancy and also no nodal tissue identified  (d) repeat PET scan 12/16/2018 shows the "hot lymph node" still in place, but smaller, with decreased SUV (originally 1.5 cm/ 7.3SUV)  (e) PET 03/09/2019 shows the hypermetabolic "lymph node" now to measure 0.4 mm, with an SUV max of 7.3  (f) brain MRI 08/05/2019 shows no evidence of disease  (g) repeat PET scan 01/01/2020 completely negative  (h) PET scan  (i) brain MRI   PLAN: Drewey is now 3 years out from completing 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  Clinically there is no evidence of disease recurrence.  His LDH is up, which is unusual for him.I am going to repeat a PET scan given the increase in the LDH and since we did not do intrathecal propjhylaxis at baseline we will also obtain a brain MRI.  If these tests are benign, as I hope, then he will return to see [place] in 6 months for routine follow-up.  Total encounter time 25 minutes.*   Marquisha Nikolov, [name], MD  01/30/21 2:30 PM Medical Oncology and Hematology [facility] [place], [place] [address] Tel. [phone]    Fax. [phone]    I, Wilburn Mylar, am acting as scribe for Dr. [name]. Efraim Vanallen.  I, Lurline Del MD, have reviewed the above documentation for accuracy and completeness, and I agree with the above.   *Total Encounter Time as defined by the Centers for Medicare and Medicaid Services includes, in addition to the face-to-face time of a patient visit (documented in the note above) non-face-to-face time: obtaining and reviewing outside history, ordering and  reviewing medications, tests or procedures, care coordination (communications with other health care professionals or caregivers) and documentation in the medical record.  

## 2021-01-30 ENCOUNTER — Other Ambulatory Visit: Payer: Self-pay

## 2021-01-30 ENCOUNTER — Inpatient Hospital Stay: Payer: Medicare Other | Admitting: Oncology

## 2021-01-30 ENCOUNTER — Inpatient Hospital Stay: Payer: Medicare Other | Attending: Oncology

## 2021-01-30 VITALS — BP 128/71 | HR 72 | Temp 97.5°F | Resp 18 | Ht 74.0 in | Wt 181.8 lb

## 2021-01-30 DIAGNOSIS — I700 Atherosclerosis of aorta: Secondary | ICD-10-CM

## 2021-01-30 DIAGNOSIS — N200 Calculus of kidney: Secondary | ICD-10-CM

## 2021-01-30 DIAGNOSIS — K5730 Diverticulosis of large intestine without perforation or abscess without bleeding: Secondary | ICD-10-CM

## 2021-01-30 DIAGNOSIS — C8330 Diffuse large B-cell lymphoma, unspecified site: Secondary | ICD-10-CM

## 2021-01-30 DIAGNOSIS — Z9221 Personal history of antineoplastic chemotherapy: Secondary | ICD-10-CM | POA: Insufficient documentation

## 2021-01-30 DIAGNOSIS — C8599 Non-Hodgkin lymphoma, unspecified, extranodal and solid organ sites: Secondary | ICD-10-CM | POA: Diagnosis not present

## 2021-01-30 DIAGNOSIS — J8410 Pulmonary fibrosis, unspecified: Secondary | ICD-10-CM

## 2021-01-30 DIAGNOSIS — Z8572 Personal history of non-Hodgkin lymphomas: Secondary | ICD-10-CM | POA: Insufficient documentation

## 2021-01-30 DIAGNOSIS — N1330 Unspecified hydronephrosis: Secondary | ICD-10-CM

## 2021-01-30 DIAGNOSIS — Z923 Personal history of irradiation: Secondary | ICD-10-CM | POA: Insufficient documentation

## 2021-01-30 LAB — LACTATE DEHYDROGENASE: LDH: 273 U/L — ABNORMAL HIGH (ref 98–192)

## 2021-01-31 LAB — TESTOSTERONE: Testosterone: 374 ng/dL (ref 264–916)

## 2021-01-31 LAB — BETA 2 MICROGLOBULIN, SERUM: Beta-2 Microglobulin: 1.4 mg/L (ref 0.6–2.4)

## 2021-02-06 ENCOUNTER — Ambulatory Visit (HOSPITAL_COMMUNITY)
Admission: RE | Admit: 2021-02-06 | Discharge: 2021-02-06 | Disposition: A | Discharge status: Home / Self Care | Payer: Medicare Other | Source: Ambulatory Visit | Attending: Oncology | Admitting: Oncology

## 2021-02-06 ENCOUNTER — Other Ambulatory Visit: Payer: Self-pay

## 2021-02-06 DIAGNOSIS — R220 Localized swelling, mass and lump, head: Secondary | ICD-10-CM | POA: Diagnosis not present

## 2021-02-06 DIAGNOSIS — J8410 Pulmonary fibrosis, unspecified: Secondary | ICD-10-CM | POA: Diagnosis not present

## 2021-02-06 DIAGNOSIS — C8330 Diffuse large B-cell lymphoma, unspecified site: Secondary | ICD-10-CM | POA: Diagnosis not present

## 2021-02-06 DIAGNOSIS — I771 Stricture of artery: Secondary | ICD-10-CM | POA: Diagnosis not present

## 2021-02-06 DIAGNOSIS — C8599 Non-Hodgkin lymphoma, unspecified, extranodal and solid organ sites: Secondary | ICD-10-CM | POA: Diagnosis not present

## 2021-02-06 MED ORDER — GADOBUTROL 1 MMOL/ML IV SOLN
8.5000 mL | Freq: Once | INTRAVENOUS | Status: AC | PRN
  Administered 2021-02-06: 21:00:00 8.5 mL via INTRAVENOUS

## 2021-02-08 ENCOUNTER — Encounter (HOSPITAL_COMMUNITY)
Admission: RE | Admit: 2021-02-08 | Discharge: 2021-02-08 | Disposition: A | Discharge status: Home / Self Care | Payer: Medicare Other | Source: Ambulatory Visit | Attending: Oncology | Admitting: Oncology

## 2021-02-08 ENCOUNTER — Other Ambulatory Visit: Payer: Self-pay

## 2021-02-08 DIAGNOSIS — K314 Gastric diverticulum: Secondary | ICD-10-CM | POA: Diagnosis not present

## 2021-02-08 DIAGNOSIS — C8330 Diffuse large B-cell lymphoma, unspecified site: Secondary | ICD-10-CM | POA: Diagnosis not present

## 2021-02-08 DIAGNOSIS — C8599 Non-Hodgkin lymphoma, unspecified, extranodal and solid organ sites: Secondary | ICD-10-CM | POA: Insufficient documentation

## 2021-02-08 DIAGNOSIS — J8410 Pulmonary fibrosis, unspecified: Secondary | ICD-10-CM | POA: Diagnosis not present

## 2021-02-08 DIAGNOSIS — K5730 Diverticulosis of large intestine without perforation or abscess without bleeding: Secondary | ICD-10-CM | POA: Diagnosis not present

## 2021-02-08 LAB — GLUCOSE, CAPILLARY: Glucose-Capillary: 100 mg/dL — ABNORMAL HIGH (ref 70–99)

## 2021-02-08 MED ORDER — FLUDEOXYGLUCOSE F - 18 (FDG) INJECTION
9.7000 | Freq: Once | INTRAVENOUS | Status: AC
  Administered 2021-02-08: 8.78 via INTRAVENOUS

## 2021-02-09 ENCOUNTER — Other Ambulatory Visit: Payer: Self-pay | Admitting: Oncology

## 2021-02-09 NOTE — Progress Notes (Signed)
Called Michael Allen with the good news and his scans.  He has a follow-up appointment with [place] in April.

## 2021-06-20 NOTE — Progress Notes (Signed)
? ?Patient Care Team: ?Seward Carol, MD as PCP - General (Internal Medicine) ?[name], MD as Consulting Physician (Urology) ?Magrinat, [name], MD (Inactive) as Consulting Physician (Oncology) ?[name], MD as Consulting Physician (Ophthalmology) ?[name], MD as Consulting Physician (Radiation Oncology) ?Alphonsa Overall, MD as Consulting Physician (General Surgery) ? ?DIAGNOSIS:  ?Encounter Diagnosis  ?Name Primary?  ? Diffuse large cell non-Hodgkin's lymphoma ([place]) Yes  ? ? ?SUMMARY OF ONCOLOGIC HISTORY: ?Oncology History  ?Diffuse large cell non-Hodgkin's lymphoma ([place])  ?12/27/2017 Initial Diagnosis  ? Diffuse large cell non-Hodgkin's lymphoma ([place]) ? ?  ?01/17/2018 - 03/06/2018 Chemotherapy  ? The patient had DOXOrubicin (ADRIAMYCIN) chemo injection 104 mg, 50 mg/m2 = 104 mg, Intravenous,  Once, 3 of 3 cycles ?Administration: 104 mg (01/17/2018), 104 mg (02/11/2018), 104 mg (03/03/2018) ?palonosetron (ALOXI) injection 0.25 mg, 0.25 mg, Intravenous,  Once, 3 of 3 cycles ?Administration: 0.25 mg (01/17/2018), 0.25 mg (02/11/2018), 0.25 mg (03/03/2018) ?pegfilgrastim-cbqv (UDENYCA) injection 6 mg, 6 mg, Subcutaneous, Once, 3 of 3 cycles ?Administration: 6 mg (01/18/2018), 6 mg (02/13/2018), 6 mg (03/06/2018) ?vinCRIStine (ONCOVIN) 1 mg in sodium chloride 0.9 % 50 mL chemo infusion, 1 mg (100 % of original dose 1 mg), Intravenous,  Once, 3 of 3 cycles ?Dose modification: 1 mg (original dose 1 mg, Cycle 1, Reason: Provider Judgment) ?Administration: 1 mg (01/17/2018), 1 mg (02/11/2018), 1 mg (03/03/2018) ?riTUXimab (RITUXAN) 800 mg in sodium chloride 0.9 % 250 mL (2.4242 mg/mL) infusion, 375 mg/m2 = 800 mg, Intravenous,  Once, 2 of 2 cycles ?Administration: 800 mg (01/17/2018) ?cyclophosphamide (CYTOXAN) 1,560 mg in sodium chloride 0.9 % 250 mL chemo infusion, 750 mg/m2 = 1,560 mg, Intravenous,  Once, 3 of 3 cycles ?Administration: 1,560 mg (01/17/2018), 1,560 mg (02/11/2018), 1,560 mg  (03/03/2018) ?riTUXimab (RITUXAN) 800 mg in sodium chloride 0.9 % 170 mL infusion, 375 mg/m2 = 800 mg, Intravenous,  Once, 2 of 2 cycles ?Administration: 800 mg (02/11/2018), 800 mg (03/03/2018) ?fosaprepitant (EMEND) 150 mg, dexamethasone (DECADRON) 12 mg in sodium chloride 0.9 % 145 mL IVPB, , Intravenous,  Once, 3 of 3 cycles ?Administration:  (01/17/2018),  (02/11/2018),  (03/03/2018) ? ? for chemotherapy treatment.  ? ?  ?11/18/2018 -  Chemotherapy  ? The patient had riTUXimab (RITUXAN) 800 mg in sodium chloride 0.9 % 250 mL (2.4242 mg/mL) infusion, 375 mg/m2 = 800 mg, Intravenous,  Once, 0 of 4 cycles ? ? for chemotherapy treatment.  ? ?  ?11/18/2018 -  Chemotherapy  ? The patient had dexamethasone (DECADRON) 4 MG tablet, 8 mg, Oral, Daily, 0 of 1 cycle, Start date: --, End date: -- ?palonosetron (ALOXI) injection 0.25 mg, 0.25 mg, Intravenous,  Once, 0 of 4 cycles ?oxaliplatin (ELOXATIN) 205 mg in dextrose 5 % 500 mL chemo infusion, 100 mg/m2 = 205 mg, Intravenous,  Once, 0 of 4 cycles ?gemcitabine (GEMZAR) 2,052 mg in sodium chloride 0.9 % 250 mL chemo infusion, 1,000 mg/m2 = 2,052 mg, Intravenous,  Once, 0 of 4 cycles ? ? for chemotherapy treatment.  ? ?  ? ? ?CHIEF COMPLIANT: Diffuse large cell non-Hodgkin's lymphoma, germinal center B-cell subtype ? ?INTERVAL HISTORY: Michael Allen is a 76 y.o. with the above mention Diffuse large cell non-Hodgkin's lymphoma, germinal center B-cell subtype. He presents to the clinic today for a follow-up. Denies trouble with hearing.  He has been feeling quite well without any fevers chills night sweats weight loss or lymphadenopathy. ? ? ?ALLERGIES:  is allergic to penicillins, blood-group specific substance, oxycodone, and shellfish allergy. ? ?MEDICATIONS:  ?  Current Outpatient Medications  ?Medication Sig Dispense Refill  ? metroNIDAZOLE (METROGEL) 0.75 % gel Apply 1 application topically 2 (two) times daily. 45 g 0  ? ?No current facility-administered medications for this  visit.  ? ? ?PHYSICAL EXAMINATION: ?ECOG PERFORMANCE STATUS: 1 - Symptomatic but completely ambulatory ? ?Vitals:  ? 07/04/21 1125  ?BP: (!) 116/57  ?Pulse: 73  ?Resp: 18  ?Temp: 97.9 ?F (36.6 ?C)  ?SpO2: 99%  ? ?Filed Weights  ? 07/04/21 1125  ?Weight: 184 lb 4.8 oz (83.6 kg)  ? ?Lymph node examination no palpable lymphadenopathy no hepatosplenomegaly ? ?LABORATORY DATA:  ?I have reviewed the data as listed ? ?  Latest Ref Rng & Units 07/04/2021  ? 11:05 AM 08/04/2020  ? 11:40 AM 01/14/2020  ? 12:48 PM  ?CMP  ?Glucose 70 - 99 mg/dL 98   95   93    ?BUN 8 - 23 mg/dL '14   15   16    ' ?Creatinine 0.61 - 1.24 mg/dL 0.79   0.77   0.83    ?Sodium 135 - 145 mmol/L 141   140   140    ?Potassium 3.5 - 5.1 mmol/L 4.0   4.4   4.1    ?Chloride 98 - 111 mmol/L 106   106   106    ?CO2 22 - 32 mmol/L '30   25   28    ' ?Calcium 8.9 - 10.3 mg/dL 9.2   9.3   9.2    ?Total Protein 6.5 - 8.1 g/dL 7.1   7.1   7.3    ?Total Bilirubin 0.3 - 1.2 mg/dL 0.5   0.7   0.5    ?Alkaline Phos 38 - 126 U/L 68   71   77    ?AST 15 - 41 U/L '23   26   22    ' ?ALT 0 - 44 U/L '13   16   14    ' ? ? ?Lab Results  ?Component Value Date  ? WBC 6.0 07/04/2021  ? HGB 14.0 07/04/2021  ? HCT 41.4 07/04/2021  ? MCV 92.0 07/04/2021  ? PLT 204 07/04/2021  ? NEUTROABS 3.0 07/04/2021  ? ? ?ASSESSMENT & PLAN:  ?Diffuse large cell non-Hodgkin's lymphoma ([place]) ?Jehovah's Witness status post left inguinal orchiectomy 12/20/2017 for a diffuse large cell non-Hodgkin's lymphoma, germinal center B-cell subtype, CD20 positive, CD5 negative. ?  ?(1) staging studies: ?(a) PET scan 12/31/2017: Uptake only in the right testicle and panniculus, both of questionable significance ?(b) bone marrow biopsy 01/01/2018 shows no evidence of lymphoma ?(c) lumbar puncture 02/27/2018 benign  ?(2) international prognostic index: 2 (age, LDH 1 week post-op = 80% predicted PFS4; stage I   ?(3) adjuvant chemotherapy: R-CHOP completed 03/03/2018  ?4) adjuvant radiation 04/02/18-04/25/18   ?-------------------------------------------------------------------------------------------------------------------------------------- ?Surveillance: No clinical evidence of disease recurrence.  Monitoring LDH. ?02/08/2021: MRI brain: Small left vestibular schwannoma decreased from 2020 ?02/09/2021: PET CT scan: Benign ? ?Plan to obtain PET CT scan and brain MRI in December ?Return to clinic in 1 year for follow-up ? ? ? ?Orders Placed This Encounter  ?Procedures  ? NM PET Image Restag (PS) Skull Base To Thigh  ?  Standing Status:   Future  ?  Standing Expiration Date:   07/04/2022  ?  Order Specific Question:   If indicated for the ordered procedure, I authorize the administration of a radiopharmaceutical per Radiology protocol  ?  Answer:   Yes  ?  Order Specific Question:  Preferred imaging location?  ?  Answer:   [facility]  ? MR Brain W Wo Contrast  ?  Standing Status:   Future  ?  Standing Expiration Date:   07/04/2022  ?  Order Specific Question:   If indicated for the ordered procedure, I authorize the administration of contrast media per Radiology protocol  ?  Answer:   Yes  ?  Order Specific Question:   What is the patient's sedation requirement?  ?  Answer:   No Sedation  ?  Order Specific Question:   Does the patient have a pacemaker or implanted devices?  ?  Answer:   No  ?  Order Specific Question:   Use SRS Protocol?  ?  Answer:   No  ?  Order Specific Question:   Preferred imaging location?  ?  Answer:   [facility] (table limit-550lbs)  ?  Order Specific Question:   Release to patient  ?  Answer:   Immediate  ? ?The patient has a good understanding of the overall plan. he agrees with it. he will call with any problems that may develop before the next visit here. ?Total time spent: 30 mins including face to face time and time spent for planning, charting and co-ordination of care ? ? Harriette Ohara, MD ?07/04/21 ? ? ?  ?

## 2021-06-30 ENCOUNTER — Other Ambulatory Visit: Payer: Self-pay | Admitting: *Deleted

## 2021-06-30 DIAGNOSIS — C8330 Diffuse large B-cell lymphoma, unspecified site: Secondary | ICD-10-CM

## 2021-07-04 ENCOUNTER — Inpatient Hospital Stay (HOSPITAL_BASED_OUTPATIENT_CLINIC_OR_DEPARTMENT_OTHER): Payer: Medicare Other | Admitting: Hematology and Oncology

## 2021-07-04 ENCOUNTER — Inpatient Hospital Stay: Payer: Medicare Other | Attending: Hematology and Oncology

## 2021-07-04 VITALS — BP 116/57 | HR 73 | Temp 97.9°F | Resp 18 | Ht 74.0 in | Wt 184.3 lb

## 2021-07-04 DIAGNOSIS — Z9221 Personal history of antineoplastic chemotherapy: Secondary | ICD-10-CM | POA: Diagnosis not present

## 2021-07-04 DIAGNOSIS — Z8572 Personal history of non-Hodgkin lymphomas: Secondary | ICD-10-CM | POA: Diagnosis not present

## 2021-07-04 DIAGNOSIS — Z9079 Acquired absence of other genital organ(s): Secondary | ICD-10-CM | POA: Diagnosis not present

## 2021-07-04 DIAGNOSIS — C8330 Diffuse large B-cell lymphoma, unspecified site: Secondary | ICD-10-CM | POA: Diagnosis not present

## 2021-07-04 LAB — CMP (CANCER CENTER ONLY)
ALT: 13 U/L (ref 0–44)
AST: 23 U/L (ref 15–41)
Albumin: 4.3 g/dL (ref 3.5–5.0)
Alkaline Phosphatase: 68 U/L (ref 38–126)
Anion gap: 5 (ref 5–15)
BUN: 14 mg/dL (ref 8–23)
CO2: 30 mmol/L (ref 22–32)
Calcium: 9.2 mg/dL (ref 8.9–10.3)
Chloride: 106 mmol/L (ref 98–111)
Creatinine: 0.79 mg/dL (ref 0.61–1.24)
GFR, Estimated: >60 mL/min (ref >60)
Glucose, Bld: 98 mg/dL (ref 70–99)
Potassium: 4 mmol/L (ref 3.5–5.1)
Sodium: 141 mmol/L (ref 135–145)
Total Bilirubin: 0.5 mg/dL (ref 0.3–1.2)
Total Protein: 7.1 g/dL (ref 6.5–8.1)

## 2021-07-04 LAB — CBC WITH DIFFERENTIAL (CANCER CENTER ONLY)
Abs Immature Granulocytes: 0.01 10*3/uL (ref 0.00–0.07)
Basophils Absolute: 0 10*3/uL (ref 0.0–0.1)
Basophils Relative: 1 %
Eosinophils Absolute: 0.2 10*3/uL (ref 0.0–0.5)
Eosinophils Relative: 3 %
HCT: 41.4 % (ref 39.0–52.0)
Hemoglobin: 14 g/dL (ref 13.0–17.0)
Immature Granulocytes: 0 %
Lymphocytes Relative: 39 %
Lymphs Abs: 2.3 10*3/uL (ref 0.7–4.0)
MCH: 31.1 pg (ref 26.0–34.0)
MCHC: 33.8 g/dL (ref 30.0–36.0)
MCV: 92 fL (ref 80.0–100.0)
Monocytes Absolute: 0.5 10*3/uL (ref 0.1–1.0)
Monocytes Relative: 9 %
Neutro Abs: 3 10*3/uL (ref 1.7–7.7)
Neutrophils Relative %: 48 %
Platelet Count: 204 10*3/uL (ref 150–400)
RBC: 4.5 MIL/uL (ref 4.22–5.81)
RDW: 13.4 % (ref 11.5–15.5)
WBC Count: 6 10*3/uL (ref 4.0–10.5)
nRBC: 0 % (ref 0.0–0.2)

## 2021-07-04 LAB — LACTATE DEHYDROGENASE: LDH: 159 U/L (ref 98–192)

## 2021-07-04 NOTE — Assessment & Plan Note (Addendum)
Jehovah's Witness status post left inguinal orchiectomy 12/20/2017 for a diffuse large cell non-Hodgkin's lymphoma, germinal center B-cell subtype, CD20 positive, CD5 negative. ?? ?(1) staging studies: ?(a) PET scan 12/31/2017: Uptake only in the right testicle and panniculus, both of questionable significance ?(b) bone marrow biopsy 01/01/2018 shows no evidence of lymphoma ?(c) lumbar puncture 02/27/2018 benign? ?(2) international prognostic index: 2 (age, LDH 1 week post-op = 80% predicted PFS4; stage I ? ?(3) adjuvant chemotherapy: R-CHOP completed 03/03/2018  ?4) adjuvant radiation 04/02/18-04/25/18? ?-------------------------------------------------------------------------------------------------------------------------------------- ?Surveillance: No clinical evidence of disease recurrence.  Monitoring LDH. ?02/08/2021: MRI brain: Small left vestibular schwannoma decreased from 2020 ?02/09/2021: PET CT scan: Benign ? ?Plan to obtain PET CT scan and brain MRI in December ?Return to clinic in 1 year for follow-up ?

## 2021-07-05 LAB — TESTOSTERONE: Testosterone: 296 ng/dL (ref 264–916)

## 2021-07-05 LAB — BETA 2 MICROGLOBULIN, SERUM: Beta-2 Microglobulin: 1.1 mg/L (ref 0.6–2.4)

## 2021-08-11 DIAGNOSIS — H524 Presbyopia: Secondary | ICD-10-CM | POA: Diagnosis not present

## 2021-08-11 DIAGNOSIS — H35373 Puckering of macula, bilateral: Secondary | ICD-10-CM | POA: Diagnosis not present

## 2021-08-11 DIAGNOSIS — H2513 Age-related nuclear cataract, bilateral: Secondary | ICD-10-CM | POA: Diagnosis not present

## 2021-10-02 ENCOUNTER — Other Ambulatory Visit: Payer: Self-pay

## 2021-10-10 ENCOUNTER — Other Ambulatory Visit: Payer: Self-pay

## 2021-11-17 DIAGNOSIS — C8330 Diffuse large B-cell lymphoma, unspecified site: Secondary | ICD-10-CM | POA: Diagnosis not present

## 2021-11-17 DIAGNOSIS — Z0000 Encounter for general adult medical examination without abnormal findings: Secondary | ICD-10-CM | POA: Diagnosis not present

## 2021-11-17 DIAGNOSIS — E7800 Pure hypercholesterolemia, unspecified: Secondary | ICD-10-CM | POA: Diagnosis not present

## 2022-02-05 ENCOUNTER — Ambulatory Visit
Admission: RE | Admit: 2022-02-05 | Discharge: 2022-02-05 | Disposition: A | Discharge status: Home / Self Care | Payer: Medicare Other | Source: Ambulatory Visit | Attending: Hematology and Oncology | Admitting: Hematology and Oncology

## 2022-02-05 DIAGNOSIS — C8330 Diffuse large B-cell lymphoma, unspecified site: Secondary | ICD-10-CM

## 2022-02-05 DIAGNOSIS — C729 Malignant neoplasm of central nervous system, unspecified: Secondary | ICD-10-CM | POA: Diagnosis not present

## 2022-02-05 MED ORDER — GADOPICLENOL 0.5 MMOL/ML IV SOLN
9.0000 mL | Freq: Once | INTRAVENOUS | Status: AC | PRN
  Administered 2022-02-05: 9 mL via INTRAVENOUS

## 2022-02-07 ENCOUNTER — Other Ambulatory Visit: Payer: Self-pay

## 2022-03-14 ENCOUNTER — Encounter (HOSPITAL_COMMUNITY)
Admission: RE | Admit: 2022-03-14 | Discharge: 2022-03-14 | Disposition: A | Discharge status: Home / Self Care | Payer: Medicare Other | Source: Ambulatory Visit | Attending: Hematology and Oncology | Admitting: Hematology and Oncology

## 2022-03-14 DIAGNOSIS — N200 Calculus of kidney: Secondary | ICD-10-CM | POA: Diagnosis not present

## 2022-03-14 DIAGNOSIS — C8330 Diffuse large B-cell lymphoma, unspecified site: Secondary | ICD-10-CM | POA: Diagnosis not present

## 2022-03-14 DIAGNOSIS — Q446 Cystic disease of liver: Secondary | ICD-10-CM | POA: Diagnosis not present

## 2022-03-14 LAB — GLUCOSE, CAPILLARY: Glucose-Capillary: 98 mg/dL (ref 70–99)

## 2022-03-14 MED ORDER — FLUDEOXYGLUCOSE F - 18 (FDG) INJECTION
9.2000 | Freq: Once | INTRAVENOUS | Status: AC
  Administered 2022-03-14: 9.2 via INTRAVENOUS

## 2022-06-29 NOTE — Progress Notes (Signed)
Patient Care Team: Renford Dills, MD as PCP - General (Internal Medicine) Marcine Matar, MD as Consulting Physician (Urology) Magrinat, Valentino Hue, MD (Inactive) as Consulting Physician (Oncology) Antony Contras, MD as Consulting Physician (Ophthalmology) Antony Blackbird, MD as Consulting Physician (Radiation Oncology) Ovidio Kin, MD as Consulting Physician (General Surgery)  DIAGNOSIS: No diagnosis found.  SUMMARY OF ONCOLOGIC HISTORY: Oncology History  Diffuse large cell non-Hodgkin's lymphoma  12/27/2017 Initial Diagnosis   Diffuse large cell non-Hodgkin's lymphoma (HCC)   01/17/2018 - 03/06/2018 Chemotherapy   The patient had DOXOrubicin (ADRIAMYCIN) chemo injection 104 mg, 50 mg/m2 = 104 mg, Intravenous,  Once, 3 of 3 cycles Administration: 104 mg (01/17/2018), 104 mg (02/11/2018), 104 mg (03/03/2018) palonosetron (ALOXI) injection 0.25 mg, 0.25 mg, Intravenous,  Once, 3 of 3 cycles Administration: 0.25 mg (01/17/2018), 0.25 mg (02/11/2018), 0.25 mg (03/03/2018) pegfilgrastim-cbqv (UDENYCA) injection 6 mg, 6 mg, Subcutaneous, Once, 3 of 3 cycles Administration: 6 mg (01/18/2018), 6 mg (02/13/2018), 6 mg (03/06/2018) vinCRIStine (ONCOVIN) 1 mg in sodium chloride 0.9 % 50 mL chemo infusion, 1 mg (100 % of original dose 1 mg), Intravenous,  Once, 3 of 3 cycles Dose modification: 1 mg (original dose 1 mg, Cycle 1, Reason: Provider Judgment) Administration: 1 mg (01/17/2018), 1 mg (02/11/2018), 1 mg (03/03/2018) riTUXimab (RITUXAN) 800 mg in sodium chloride 0.9 % 250 mL (2.4242 mg/mL) infusion, 375 mg/m2 = 800 mg, Intravenous,  Once, 2 of 2 cycles Administration: 800 mg (01/17/2018) cyclophosphamide (CYTOXAN) 1,560 mg in sodium chloride 0.9 % 250 mL chemo infusion, 750 mg/m2 = 1,560 mg, Intravenous,  Once, 3 of 3 cycles Administration: 1,560 mg (01/17/2018), 1,560 mg (02/11/2018), 1,560 mg (03/03/2018) riTUXimab (RITUXAN) 800 mg in sodium chloride 0.9 % 170 mL infusion, 375 mg/m2 = 800 mg,  Intravenous,  Once, 2 of 2 cycles Administration: 800 mg (02/11/2018), 800 mg (03/03/2018) fosaprepitant (EMEND) 150 mg, dexamethasone (DECADRON) 12 mg in sodium chloride 0.9 % 145 mL IVPB, , Intravenous,  Once, 3 of 3 cycles Administration:  (01/17/2018),  (02/11/2018),  (03/03/2018)  for chemotherapy treatment.    11/18/2018 - 11/18/2018 Chemotherapy   Patient is on Treatment Plan : NON-HODGKINS LYMPHOMA Rituximab q7d     11/18/2018 -  Chemotherapy   The patient had dexamethasone (DECADRON) 4 MG tablet, 8 mg, Oral, Daily, 0 of 1 cycle, Start date: --, End date: -- palonosetron (ALOXI) injection 0.25 mg, 0.25 mg, Intravenous,  Once, 0 of 4 cycles oxaliplatin (ELOXATIN) 205 mg in dextrose 5 % 500 mL chemo infusion, 100 mg/m2 = 205 mg, Intravenous,  Once, 0 of 4 cycles gemcitabine (GEMZAR) 2,052 mg in sodium chloride 0.9 % 250 mL chemo infusion, 1,000 mg/m2 = 2,052 mg, Intravenous,  Once, 0 of 4 cycles  for chemotherapy treatment.      CHIEF COMPLIANT:   INTERVAL HISTORY: Michael Allen is a   ALLERGIES:  is allergic to penicillins, blood-group specific substance, oxycodone, and shellfish allergy.  MEDICATIONS:  Current Outpatient Medications  Medication Sig Dispense Refill   metroNIDAZOLE (METROGEL) 0.75 % gel Apply 1 application topically 2 (two) times daily. 45 g 0   No current facility-administered medications for this visit.    PHYSICAL EXAMINATION: ECOG PERFORMANCE STATUS: {CHL ONC ECOG PS:[phone]}  There were no vitals filed for this visit. There were no vitals filed for this visit.  BREAST:*** No palpable masses or nodules in either right or left breasts. No palpable axillary supraclavicular or infraclavicular adenopathy no breast tenderness or nipple discharge. (exam performed in the presence of  a chaperone)  LABORATORY DATA:  I have reviewed the data as listed    Latest Ref Rng & Units 07/04/2021   11:05 AM 08/04/2020   11:40 AM 01/14/2020   12:48 PM  CMP  Glucose 70 -  99 mg/dL 98  95  93   BUN 8 - 23 mg/dL Creatinine 0.61 - 1.24 mg/dL 1.61  0.96  0.45   Sodium 135 - 145 mmol/L 141  140  140   Potassium 3.5 - 5.1 mmol/L 4.0  4.4  4.1   Chloride 98 - 111 mmol/L 106  106  106   CO2 22 - 32 mmol/L Calcium 8.9 - 10.3 mg/dL 9.2  9.3  9.2   Total Protein 6.5 - 8.1 g/dL 7.1  7.1  7.3   Total Bilirubin 0.3 - 1.2 mg/dL 0.5  0.7  0.5   Alkaline Phos 38 - 126 U/L 68  71  77   AST 15 - 41 U/L ALT 0 - 44 U/L Lab Results  Component Value Date   WBC 6.0 07/04/2021   HGB 14.0 07/04/2021   HCT 41.4 07/04/2021   MCV 92.0 07/04/2021   PLT 204 07/04/2021   NEUTROABS 3.0 07/04/2021    ASSESSMENT & PLAN:  No problem-specific Assessment & Plan notes found for this encounter.    No orders of the defined types were placed in this encounter.  The patient has a good understanding of the overall plan. he agrees with it. he will call with any problems that may develop before the next visit here. Total time spent: 30 mins including face to face time and time spent for planning, charting and co-ordination of care   Sherlyn Lick, CMA 06/29/22    I Janan Ridge am acting as a Neurosurgeon for [employer]  ***

## 2022-07-04 ENCOUNTER — Inpatient Hospital Stay: Payer: Medicare Other | Attending: Hematology and Oncology | Admitting: Hematology and Oncology

## 2022-07-04 ENCOUNTER — Other Ambulatory Visit: Payer: Self-pay

## 2022-07-04 VITALS — BP 120/55 | HR 63 | Temp 97.9°F | Resp 18 | Ht 74.0 in | Wt 180.2 lb

## 2022-07-04 DIAGNOSIS — Z9221 Personal history of antineoplastic chemotherapy: Secondary | ICD-10-CM | POA: Diagnosis not present

## 2022-07-04 DIAGNOSIS — C8330 Diffuse large B-cell lymphoma, unspecified site: Secondary | ICD-10-CM

## 2022-07-04 DIAGNOSIS — D333 Benign neoplasm of cranial nerves: Secondary | ICD-10-CM | POA: Insufficient documentation

## 2022-07-04 DIAGNOSIS — Z923 Personal history of irradiation: Secondary | ICD-10-CM | POA: Insufficient documentation

## 2022-07-04 DIAGNOSIS — Z8572 Personal history of non-Hodgkin lymphomas: Secondary | ICD-10-CM | POA: Insufficient documentation

## 2022-07-04 NOTE — Assessment & Plan Note (Signed)
Jehovah's Witness status post left inguinal orchiectomy 12/20/2017 for a diffuse large cell non-Hodgkin's lymphoma, germinal center B-cell subtype, CD20 positive, CD5 negative.   (1) staging studies: (a) PET scan 12/31/2017: Uptake only in the right testicle and panniculus, both of questionable significance (b) bone marrow biopsy 01/01/2018 shows no evidence of lymphoma (c) lumbar puncture 02/27/2018 benign  (2) international prognostic index: 2 (age, LDH 1 week post-op = 80% predicted PFS4; stage I   (3) adjuvant chemotherapy: R-CHOP completed 03/03/2018  4) adjuvant radiation 04/02/18-04/25/18  -------------------------------------------------------------------------------------------------------------------------------------- Surveillance: No clinical evidence of disease recurrence.  Monitoring [facility]. 02/08/2021: MRI brain: Small left vestibular schwannoma decreased from 2020 02/09/2021: PET CT scan: Benign 02/06/2022: MRI brain: 8 mm vestibular schwannoma on the left (stable) 03/16/2022: PET/CT: Benign   Plan to obtain PET CT scan and brain MRI in December Return to clinic in 1 year for follow-up

## 2022-07-05 ENCOUNTER — Other Ambulatory Visit: Payer: Self-pay

## 2022-09-04 DIAGNOSIS — H6123 Impacted cerumen, bilateral: Secondary | ICD-10-CM | POA: Diagnosis not present

## 2022-10-31 DIAGNOSIS — H2513 Age-related nuclear cataract, bilateral: Secondary | ICD-10-CM | POA: Diagnosis not present

## 2022-10-31 DIAGNOSIS — H43813 Vitreous degeneration, bilateral: Secondary | ICD-10-CM | POA: Diagnosis not present

## 2022-11-22 DIAGNOSIS — H9192 Unspecified hearing loss, left ear: Secondary | ICD-10-CM | POA: Diagnosis not present

## 2022-11-22 DIAGNOSIS — I8393 Asymptomatic varicose veins of bilateral lower extremities: Secondary | ICD-10-CM | POA: Diagnosis not present

## 2022-11-22 DIAGNOSIS — I700 Atherosclerosis of aorta: Secondary | ICD-10-CM | POA: Diagnosis not present

## 2022-11-22 DIAGNOSIS — C8330 Diffuse large B-cell lymphoma, unspecified site: Secondary | ICD-10-CM | POA: Diagnosis not present

## 2022-11-22 DIAGNOSIS — R413 Other amnesia: Secondary | ICD-10-CM | POA: Diagnosis not present

## 2022-11-22 DIAGNOSIS — R5383 Other fatigue: Secondary | ICD-10-CM | POA: Diagnosis not present

## 2022-11-22 DIAGNOSIS — Z0000 Encounter for general adult medical examination without abnormal findings: Secondary | ICD-10-CM | POA: Diagnosis not present

## 2022-11-22 DIAGNOSIS — D333 Benign neoplasm of cranial nerves: Secondary | ICD-10-CM | POA: Diagnosis not present

## 2022-12-06 DIAGNOSIS — R413 Other amnesia: Secondary | ICD-10-CM | POA: Diagnosis not present

## 2022-12-10 ENCOUNTER — Encounter: Payer: Self-pay | Admitting: Physician Assistant

## 2022-12-12 DIAGNOSIS — H903 Sensorineural hearing loss, bilateral: Secondary | ICD-10-CM | POA: Diagnosis not present

## 2023-01-16 ENCOUNTER — Ambulatory Visit: Payer: Medicare Other | Admitting: Physician Assistant

## 2023-01-16 ENCOUNTER — Ambulatory Visit: Payer: Medicare Other

## 2023-02-08 ENCOUNTER — Inpatient Hospital Stay: Payer: Medicare Other | Attending: Hematology and Oncology

## 2023-02-08 ENCOUNTER — Ambulatory Visit (HOSPITAL_COMMUNITY)
Admission: RE | Admit: 2023-02-08 | Discharge: 2023-02-08 | Disposition: A | Discharge status: Home / Self Care | Payer: Medicare Other | Source: Ambulatory Visit | Attending: Hematology and Oncology | Admitting: Hematology and Oncology

## 2023-02-08 DIAGNOSIS — I6782 Cerebral ischemia: Secondary | ICD-10-CM | POA: Diagnosis not present

## 2023-02-08 DIAGNOSIS — C8335 Diffuse large B-cell lymphoma, lymph nodes of inguinal region and lower limb: Secondary | ICD-10-CM | POA: Diagnosis not present

## 2023-02-08 DIAGNOSIS — R9089 Other abnormal findings on diagnostic imaging of central nervous system: Secondary | ICD-10-CM | POA: Diagnosis not present

## 2023-02-08 DIAGNOSIS — D333 Benign neoplasm of cranial nerves: Secondary | ICD-10-CM | POA: Insufficient documentation

## 2023-02-08 DIAGNOSIS — C8330 Diffuse large B-cell lymphoma, unspecified site: Secondary | ICD-10-CM

## 2023-02-08 LAB — CMP (CANCER CENTER ONLY)
ALT: 14 U/L (ref 0–44)
AST: 24 U/L (ref 15–41)
Albumin: 4.3 g/dL (ref 3.5–5.0)
Alkaline Phosphatase: 79 U/L (ref 38–126)
Anion gap: 6 (ref 5–15)
BUN: 18 mg/dL (ref 8–23)
CO2: 29 mmol/L (ref 22–32)
Calcium: 9.5 mg/dL (ref 8.9–10.3)
Chloride: 105 mmol/L (ref 98–111)
Creatinine: 0.77 mg/dL (ref 0.61–1.24)
GFR, Estimated: >60 mL/min (ref >60)
Glucose, Bld: 83 mg/dL (ref 70–99)
Potassium: 4.2 mmol/L (ref 3.5–5.1)
Sodium: 140 mmol/L (ref 135–145)
Total Bilirubin: 0.6 mg/dL (ref <1.2)
Total Protein: 7.2 g/dL (ref 6.5–8.1)

## 2023-02-08 LAB — CBC WITH DIFFERENTIAL (CANCER CENTER ONLY)
Abs Immature Granulocytes: 0.02 10*3/uL (ref 0.00–0.07)
Basophils Absolute: 0 10*3/uL (ref 0.0–0.1)
Basophils Relative: 0 %
Eosinophils Absolute: 0.1 10*3/uL (ref 0.0–0.5)
Eosinophils Relative: 2 %
HCT: 44 % (ref 39.0–52.0)
Hemoglobin: 14.5 g/dL (ref 13.0–17.0)
Immature Granulocytes: 0 %
Lymphocytes Relative: 39 %
Lymphs Abs: 2.5 10*3/uL (ref 0.7–4.0)
MCH: 30.6 pg (ref 26.0–34.0)
MCHC: 33 g/dL (ref 30.0–36.0)
MCV: 92.8 fL (ref 80.0–100.0)
Monocytes Absolute: 0.6 10*3/uL (ref 0.1–1.0)
Monocytes Relative: 9 %
Neutro Abs: 3.2 10*3/uL (ref 1.7–7.7)
Neutrophils Relative %: 50 %
Platelet Count: 229 10*3/uL (ref 150–400)
RBC: 4.74 MIL/uL (ref 4.22–5.81)
RDW: 13 % (ref 11.5–15.5)
WBC Count: 6.4 10*3/uL (ref 4.0–10.5)
nRBC: 0 % (ref 0.0–0.2)

## 2023-02-08 LAB — LACTATE DEHYDROGENASE: LDH: 173 U/L (ref 98–192)

## 2023-02-08 MED ORDER — GADOBUTROL 1 MMOL/ML IV SOLN
8.0000 mL | Freq: Once | INTRAVENOUS | Status: AC | PRN
  Administered 2023-02-08: 8 mL via INTRAVENOUS

## 2023-02-10 NOTE — Progress Notes (Signed)
Assessment/Plan:     Michael Allen is a very pleasant 77 y.o. year old RH male with a history of hypertension, hyperlipidemia, B12 and D deficiency, diffuse large B cell lymphoma, Jehova's witness, seen today for evaluation of memory loss. MoCA today is . 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   Continue to control mood as per PCP Folllow up in   Subjective:    The patient is accompanied by his wife***  who supplements the history.    How long did patient have memory difficulties?  For about.  Patient reports some difficulty remembering new information, phone conversations, names. repeats oneself?  Endorsed Disoriented when walking into a room?  Patient denies ***  Leaving objects in unusual places?  denies   Wandering behavior? denies   Any personality changes, or depression, anxiety? denies*** Hallucinations or paranoia? denies   Seizures? denies    Any sleep changes?  Sleeps well ***/does not sleep well. ***vivid dreams, REM behavior or sleepwalking   Sleep apnea? Denies.   Any hygiene concerns?  Denies.   Independent of bathing and dressing? Endorsed  Does the patient need help with medications?  is in charge *** Who is in charge of the finances?  is in charge   *** Any changes in appetite?   Denies. ***   Patient have trouble swallowing?  Denies.   Does the patient cook? No*** Any headaches?  Denies.   Chronic pain? Denies.    Ambulates with difficulty? Denies ***  Needs a cane*** Needs a walker *** to ambulate for stability.   Recent falls or head injuries? Denies.     Vision changes?  Denies any new issues.  Has a history of*** Any strokelike symptoms? Denies.   Any tremors? Denies. *** Any anosmia? Denies.   Any incontinence of urine? Denies.   Any bowel dysfunction? Denies.      Patient lives with ***  History of heavy alcohol intake? Denies.   History of heavy tobacco use? Denies.   Family history of dementia?   *** with dementia  Does patient drive? 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 Yes Did it involve sudden or severe rash/hives, skin peeling, or any reaction on the inside of your mouth or nose? Yes Did you need to seek medical attention at a hospital or doctor's office? Yes When did it last happen?  2005     If all above answers are "NO", may proceed with cephalosporin use.    Blood-Group Specific Substance     NO BLOOD PRODUCTS    Oxycodone Other (See Comments)    Made pt "looney" per wife    Shellfish Allergy Hives    Shell fish and shrimp    Current Outpatient Medications  Medication Instructions  metroNIDAZOLE (METROGEL) 0.75 % gel 1 application , Topical, 2 times daily     VITALS:  There were no vitals filed for this visit.    PHYSICAL EXAM   HEENT:  Normocephalic, atraumatic.  The superficial temporal arteries are without ropiness or tenderness. Cardiovascular: Regular rate and rhythm. Lungs: Clear to auscultation bilaterally. Neck: There are no carotid bruits noted bilaterally.  NEUROLOGICAL:     No data to display              No data to display           Orientation:  Alert and oriented to  person, place and not to time***. No aphasia or dysarthria. Fund of knowledge is appropriate. Recent and remote memory impaired.  Attention and concentration are reduced***.  Able to name objects and repeat phrases /5 ***. Delayed recall  / *** Cranial nerves: There is good facial symmetry. Extraocular muscles are intact and visual fields are full to confrontational testing. Speech is fluent and clear. No tongue deviation. Hearing is intact to conversational tone.*** Tone: Tone is good throughout. Sensation: Sensation is intact to light touch.  Vibration is intact at the bilateral big toe.  Coordination: The patient has no difficulty with RAM's or FNF bilaterally. Normal finger to nose  Motor: Strength is 5/5 in the bilateral upper and lower extremities. There is no pronator drift. There are no fasciculations noted. DTR's: Deep tendon reflexes are 2/4 bilaterally. Gait and Station: The patient is able to ambulate without difficulty. Gait is cautious and narrow. Stride length is normal ***      Thank you for allowing [place] the opportunity to participate in the care of this nice patient. Please do not hesitate to contact us for any questions or concerns.   Total time spent on today's visit was *** minutes dedicated to this patient today, preparing to see patient, examining the patient, ordering tests and/or medications and counseling the patient, documenting clinical information in the EHR or other health record, independently interpreting results and communicating results to the patient/family, discussing treatment and goals, answering patient's questions and coordinating care.  Cc:  Renford Dills, MD  Marlowe Kays 02/10/2023 4:03 PM

## 2023-02-13 ENCOUNTER — Ambulatory Visit: Payer: Medicare Other

## 2023-02-13 ENCOUNTER — Other Ambulatory Visit: Payer: Medicare Other

## 2023-02-13 ENCOUNTER — Ambulatory Visit: Payer: Medicare Other | Admitting: Physician Assistant

## 2023-02-13 ENCOUNTER — Encounter: Payer: Self-pay | Admitting: Physician Assistant

## 2023-02-13 VITALS — BP 110/69 | HR 65 | Resp 20 | Ht 74.0 in | Wt 189.0 lb

## 2023-02-13 DIAGNOSIS — R413 Other amnesia: Secondary | ICD-10-CM | POA: Diagnosis not present

## 2023-02-13 NOTE — Patient Instructions (Addendum)
It was a pleasure to see you today at our office.   Recommendations:  Neurocognitive evaluation at our office  Check labs today [address]    Follow up in 6  months     For psychiatric meds, mood meds: Please have your primary care physician manage these medications.  If you have any severe symptoms of a stroke, or other severe issues such as confusion,severe chills or fever, etc call 911 or go to the ER as you may need to be evaluated further     For assessment of decision of mental capacity and competency:  Call Dr. Erick Blinks, geriatric psychiatrist at [phone]  Counseling regarding caregiver distress, including caregiver depression, anxiety and issues regarding community resources, adult day care programs, adult living facilities, or memory care questions:  please contact your  Primary Doctor's Social Worker   Whom to call: Memory  decline, memory medications: Call our office [phone]    [email or web address]   RECOMMENDATIONS FOR ALL PATIENTS WITH MEMORY PROBLEMS: 1. Continue to exercise (Recommend 30 minutes of walking everyday, or 3 hours every week) 2. Increase social interactions - continue going to [place] and enjoy social gatherings with friends and family 3. Eat healthy, avoid fried foods and eat more fruits and vegetables 4. Maintain adequate blood pressure, blood sugar, and blood cholesterol level. Reducing the risk of stroke and cardiovascular disease also helps promoting better memory. 5. Avoid stressful situations. Live a simple life and avoid aggravations. Organize your time and prepare for the next day in anticipation. 6. Sleep well, avoid any interruptions of sleep and avoid any distractions in the bedroom that may interfere with adequate sleep quality 7. Avoid sugar, avoid sweets as there is a strong link between excessive sugar intake, diabetes, and cognitive impairment We discussed the  Mediterranean diet, which has been shown to help patients reduce the risk of progressive memory disorders and reduces cardiovascular risk. This includes eating fish, eat fruits and green leafy vegetables, nuts like almonds and hazelnuts, walnuts, and also use olive oil. Avoid fast foods and fried foods as much as possible. Avoid sweets and sugar as sugar use has been linked to worsening of memory function.  There is always a concern of gradual progression of memory problems. If this is the case, then we may need to adjust level of care according to patient needs. Support, both to the patient and caregiver, should then be put into place.      You have been referred for a neuropsychological evaluation (i.e., evaluation of memory and thinking abilities). Please bring someone with you to this appointment if possible, as it is helpful for the doctor to hear from both you and another adult who knows you well. Please bring eyeglasses and hearing aids if you wear them.    The evaluation will take approximately 3 hours and has two parts:   The first part is a clinical interview with the neuropsychologist (Dr. Milbert Coulter or Dr. Roseanne Reno). During the interview, the neuropsychologist will speak with you and the individual you brought to the appointment.    The second part of the evaluation is testing with the doctor's technician Annabelle Harman or Selena Batten). During the testing, the technician will ask you to remember different types of material, solve problems, and answer some questionnaires. Your family member will not be present for this portion of the evaluation.   Please note: We must reserve several hours of the neuropsychologist's time and the psychometrician's time for your evaluation appointment. As such, there is  a No-Show fee of $100. If you are unable to attend any of your appointments, please contact our office as soon as possible to reschedule.      DRIVING: Regarding driving, in patients with progressive memory  problems, driving will be impaired. We advise to have someone else do the driving if trouble finding directions or if minor accidents are reported. Independent driving assessment is available to determine safety of driving.   If you are interested in the driving assessment, you can contact the following:  [facility] [employer] [facility] in [place] [phone]  [facility] [phone]  [facility] [phone]  [facility] [phone] or [phone]   FALL PRECAUTIONS: Be cautious when walking. Scan the area for obstacles that may increase the risk of trips and falls. When getting up in the mornings, sit up at the edge of the bed for a few minutes before getting out of bed. Consider elevating the bed at the head end to avoid drop of blood pressure when getting up. Walk always in a well-lit room (use night lights in the walls). Avoid area rugs or power cords from appliances in the middle of the walkways. Use a walker or a cane if necessary and consider physical therapy for balance exercise. Get your eyesight checked regularly.  FINANCIAL OVERSIGHT: Supervision, especially oversight when making financial decisions or transactions is also recommended.  HOME SAFETY: Consider the safety of the kitchen when operating appliances like stoves, microwave oven, and blender. Consider having supervision and share cooking responsibilities until no longer able to participate in those. Accidents with firearms and other hazards in the house should be identified and addressed as well.   ABILITY TO BE LEFT ALONE: If patient is unable to contact 911 operator, consider using LifeLine, or when the need is there, arrange for someone to stay with patients. Smoking is a fire hazard, consider supervision or cessation. Risk of wandering should be assessed by caregiver and if detected at any point, supervision and safe proof recommendations should be instituted.  MEDICATION SUPERVISION:  Inability to self-administer medication needs to be constantly addressed. Implement a mechanism to ensure safe administration of the medications.      Mediterranean Diet A Mediterranean diet refers to food and lifestyle choices that are based on the traditions of countries located on the [place]. This way of eating has been shown to help prevent certain conditions and improve outcomes for people who have chronic diseases, like kidney disease and heart disease. What are tips for following this plan? Lifestyle  Cook and eat meals together with your family, when possible. Drink enough fluid to keep your urine clear or pale yellow. Be physically active every day. This includes: Aerobic exercise like running or swimming. Leisure activities like gardening, walking, or housework. Get 7-8 hours of sleep each night. If recommended by your health care provider, drink red wine in moderation. This means 1 glass a day for nonpregnant women and 2 glasses a day for men. A glass of wine equals 5 oz (150 mL). Reading food labels  Check the serving size of packaged foods. For foods such as rice and pasta, the serving size refers to the amount of cooked product, not dry. Check the total fat in packaged foods. Avoid foods that have saturated fat or trans fats. Check the ingredients list for added sugars, such as corn syrup. Shopping  At the grocery store, buy most of your food from the areas near the walls of the store. This includes: Fresh fruits  and vegetables (produce). Grains, beans, nuts, and seeds. Some of these may be available in unpackaged forms or large amounts (in bulk). Fresh seafood. Poultry and eggs. Low-fat dairy products. Buy whole ingredients instead of prepackaged foods. Buy fresh fruits and vegetables in-season from local farmers markets. Buy frozen fruits and vegetables in resealable bags. If you do not have access to quality fresh seafood, buy precooked frozen shrimp or  canned fish, such as tuna, salmon, or sardines. Buy small amounts of raw or cooked vegetables, salads, or olives from the deli or salad bar at your store. Stock your pantry so you always have certain foods on hand, such as olive oil, canned tuna, canned tomatoes, rice, pasta, and beans. Cooking  Cook foods with extra-virgin olive oil instead of using butter or other vegetable oils. Have meat as a side dish, and have vegetables or grains as your main dish. This means having meat in small portions or adding small amounts of meat to foods like pasta or stew. Use beans or vegetables instead of meat in common dishes like chili or lasagna. Experiment with different cooking methods. Try roasting or broiling vegetables instead of steaming or sauteing them. Add frozen vegetables to soups, stews, pasta, or rice. Add nuts or seeds for added healthy fat at each meal. You can add these to yogurt, salads, or vegetable dishes. Marinate fish or vegetables using olive oil, lemon juice, garlic, and fresh herbs. Meal planning  Plan to eat 1 vegetarian meal one day each week. Try to work up to 2 vegetarian meals, if possible. Eat seafood 2 or more times a week. Have healthy snacks readily available, such as: Vegetable sticks with hummus. Greek yogurt. Fruit and nut trail mix. Eat balanced meals throughout the week. This includes: Fruit: 2-3 servings a day Vegetables: 4-5 servings a day Low-fat dairy: 2 servings a day Fish, poultry, or lean meat: 1 serving a day Beans and legumes: 2 or more servings a week Nuts and seeds: 1-2 servings a day Whole grains: 6-8 servings a day Extra-virgin olive oil: 3-4 servings a day Limit red meat and sweets to only a few servings a month What are my food choices? Mediterranean diet Recommended Grains: Whole-grain pasta. Brown rice. Bulgar wheat. Polenta. Couscous. Whole-wheat bread. Orpah Cobb. Vegetables: Artichokes. Beets. Broccoli. Cabbage. Carrots. Eggplant.  Green beans. Chard. Kale. Spinach. Onions. Leeks. Peas. Squash. Tomatoes. Peppers. Radishes. Fruits: Apples. Apricots. Avocado. Berries. Bananas. Cherries. Dates. Figs. Grapes. Lemons. Melon. Oranges. Peaches. Plums. Pomegranate. Meats and other protein foods: Beans. Almonds. Sunflower seeds. Pine nuts. Peanuts. Cod. Salmon. Scallops. Shrimp. Tuna. Tilapia. Clams. Oysters. Eggs. Dairy: Low-fat milk. Cheese. Greek yogurt. Beverages: Water. Red wine. Herbal tea. Fats and oils: Extra virgin olive oil. Avocado oil. Grape seed oil. Sweets and desserts: [place] yogurt with honey. Baked apples. Poached pears. Trail mix. Seasoning and other foods: Basil. Cilantro. Coriander. Cumin. Mint. Parsley. Sage. Rosemary. Tarragon. Garlic. Oregano. Thyme. Pepper. Balsalmic vinegar. Tahini. Hummus. Tomato sauce. Olives. Mushrooms. Limit these Grains: Prepackaged pasta or rice dishes. Prepackaged cereal with added sugar. Vegetables: Deep fried potatoes (french fries). Fruits: Fruit canned in syrup. Meats and other protein foods: Beef. Pork. Lamb. Poultry with skin. Hot dogs. Tomasa Blase. Dairy: Ice cream. Sour cream. Whole milk. Beverages: Juice. Sugar-sweetened soft drinks. Beer. Liquor and spirits. Fats and oils: Butter. Canola oil. Vegetable oil. Beef fat (tallow). Lard. Sweets and desserts: Cookies. Cakes. Pies. Candy. Seasoning and other foods: Mayonnaise. Premade sauces and marinades. The items listed may not be a complete list. Talk  with your dietitian about what dietary choices are right for you. Summary The Mediterranean diet includes both food and lifestyle choices. Eat a variety of fresh fruits and vegetables, beans, nuts, seeds, and whole grains. Limit the amount of red meat and sweets that you eat. Talk with your health care provider about whether it is safe for you to drink red wine in moderation. This means 1 glass a day for nonpregnant women and 2 glasses a day for men. A glass of wine equals 5 oz (150  mL). This information is not intended to replace advice given to you by your health care provider. Make sure you discuss any questions you have with your health care provider. Document Released: 10/20/2015 Document Revised: 11/22/2015 Document Reviewed: 10/20/2015 Elsevier Interactive Patient Education  2017 [employer].

## 2023-02-14 ENCOUNTER — Other Ambulatory Visit: Payer: Self-pay

## 2023-02-14 LAB — VITAMIN B12: Vitamin B-12: 268 pg/mL (ref 200–1100)

## 2023-02-14 LAB — TSH: TSH: 1.75 m[IU]/L (ref 0.40–4.50)

## 2023-02-14 NOTE — Progress Notes (Signed)
B12 is low, start taking 1000 mcg daily and follow with PCP. Thyroid is normal, thanks

## 2023-04-29 ENCOUNTER — Institutional Professional Consult (permissible substitution): Payer: Medicare Other | Admitting: Psychology

## 2023-04-29 ENCOUNTER — Ambulatory Visit: Payer: Self-pay

## 2023-05-06 ENCOUNTER — Institutional Professional Consult (permissible substitution): Payer: Medicare Other | Admitting: Psychology

## 2023-05-06 ENCOUNTER — Ambulatory Visit: Payer: Self-pay

## 2023-05-06 ENCOUNTER — Encounter: Payer: Medicare Other | Admitting: Psychology

## 2023-05-13 ENCOUNTER — Encounter: Payer: Medicare Other | Admitting: Psychology

## 2023-05-29 ENCOUNTER — Ambulatory Visit: Payer: Self-pay

## 2023-05-29 ENCOUNTER — Institutional Professional Consult (permissible substitution): Payer: Medicare Other | Admitting: Psychology

## 2023-06-05 ENCOUNTER — Encounter: Payer: Medicare Other | Admitting: Psychology

## 2023-07-08 ENCOUNTER — Inpatient Hospital Stay: Payer: Medicare Other | Attending: Hematology and Oncology | Admitting: Hematology and Oncology

## 2023-07-08 DIAGNOSIS — Z8572 Personal history of non-Hodgkin lymphomas: Secondary | ICD-10-CM | POA: Insufficient documentation

## 2023-07-08 DIAGNOSIS — D333 Benign neoplasm of cranial nerves: Secondary | ICD-10-CM | POA: Diagnosis not present

## 2023-07-08 DIAGNOSIS — C8330 Diffuse large B-cell lymphoma, unspecified site: Secondary | ICD-10-CM

## 2023-07-08 NOTE — Assessment & Plan Note (Signed)
 Jehovah's Witness status post left inguinal orchiectomy 12/20/2017 for a diffuse large cell non-Hodgkin's lymphoma, germinal center B-cell subtype, CD20 positive, CD5 negative.   (1) staging studies: (a) PET scan 12/31/2017: Uptake only in the right testicle and panniculus, both of questionable significance (b) bone marrow biopsy 01/01/2018 shows no evidence of lymphoma (c) lumbar puncture 02/27/2018 benign  (2) international prognostic index: 2 (age, LDH 1 week post-op = 80% predicted PFS4; stage I   (3) adjuvant chemotherapy: R-CHOP completed 03/03/2018  4) adjuvant radiation 04/02/18-04/25/18  -------------------------------------------------------------------------------------------------------------------------------------- Surveillance: No clinical evidence of disease recurrence.  Monitoring LDH. 02/08/2021: MRI brain: Small left vestibular schwannoma decreased from 2020 02/09/2021: PET CT scan: Benign 02/06/2022: MRI brain: 8 mm vestibular schwannoma on the left (stable) 03/16/2022: PET/CT: Benign   02/08/2023: Brain MRI: Enhancing mass in the internal auditory canal of the left consistent with vestibular schwannoma smaller than previous (3 x 6 mm) this used to be 7 x 12 mm.   I discussed with the patient about following with neuro-oncology I did not recommend getting any more PET CT scan since he completed nearly 5 years of follow-up.   Return to clinic in 1 year for follow-up

## 2023-07-08 NOTE — Progress Notes (Signed)
 Patient Care Team: Merl Star, MD as PCP - General (Internal Medicine) Trent Frizzle, MD as Consulting Physician (Urology) Magrinat, Rozella Cornfield, MD (Inactive) as Consulting Physician (Oncology) Alvina Axon, MD as Consulting Physician (Ophthalmology) Retta Caster, MD as Consulting Physician (Radiation Oncology) Juanita Norlander, MD as Consulting Physician (General Surgery)  DIAGNOSIS:  Encounter Diagnoses  Name Primary?   Diffuse large cell non-Hodgkin's lymphoma (HCC) Yes   Vestibular schwannoma (HCC)     SUMMARY OF ONCOLOGIC HISTORY: Oncology History  Diffuse large cell non-Hodgkin's lymphoma (HCC)  12/27/2017 Initial Diagnosis   Diffuse large cell non-Hodgkin's lymphoma (HCC)   01/17/2018 - 03/06/2018 Chemotherapy   The patient had DOXOrubicin  (ADRIAMYCIN ) chemo injection 104 mg, 50 mg/m2 = 104 mg, Intravenous,  Once, 3 of 3 cycles Administration: 104 mg (01/17/2018), 104 mg (02/11/2018), 104 mg (03/03/2018) palonosetron  (ALOXI ) injection 0.25 mg, 0.25 mg, Intravenous,  Once, 3 of 3 cycles Administration: 0.25 mg (01/17/2018), 0.25 mg (02/11/2018), 0.25 mg (03/03/2018) pegfilgrastim -cbqv (UDENYCA ) injection 6 mg, 6 mg, Subcutaneous, Once, 3 of 3 cycles Administration: 6 mg (01/18/2018), 6 mg (02/13/2018), 6 mg (03/06/2018) vinCRIStine  (ONCOVIN ) 1 mg in sodium chloride  0.9 % 50 mL chemo infusion, 1 mg (100 % of original dose 1 mg), Intravenous,  Once, 3 of 3 cycles Dose modification: 1 mg (original dose 1 mg, Cycle 1, Reason: Provider Judgment) Administration: 1 mg (01/17/2018), 1 mg (02/11/2018), 1 mg (03/03/2018) riTUXimab  (RITUXAN ) 800 mg in sodium chloride  0.9 % 250 mL (2.4242 mg/mL) infusion, 375 mg/m2 = 800 mg, Intravenous,  Once, 2 of 2 cycles Administration: 800 mg (01/17/2018) cyclophosphamide  (CYTOXAN ) 1,560 mg in sodium chloride  0.9 % 250 mL chemo infusion, 750 mg/m2 = 1,560 mg, Intravenous,  Once, 3 of 3 cycles Administration: 1,560 mg (01/17/2018), 1,560 mg  (02/11/2018), 1,560 mg (03/03/2018) riTUXimab  (RITUXAN ) 800 mg in sodium chloride  0.9 % 170 mL infusion, 375 mg/m2 = 800 mg, Intravenous,  Once, 2 of 2 cycles Administration: 800 mg (02/11/2018), 800 mg (03/03/2018) fosaprepitant  (EMEND ) 150 mg, dexamethasone  (DECADRON ) 12 mg in sodium chloride  0.9 % 145 mL IVPB, , Intravenous,  Once, 3 of 3 cycles Administration:  (01/17/2018),  (02/11/2018),  (03/03/2018)  for chemotherapy treatment.    11/18/2018 - 11/18/2018 Chemotherapy   Patient is on Treatment Plan : NON-HODGKINS LYMPHOMA Rituximab  q7d     11/18/2018 -  Chemotherapy   The patient had dexamethasone  (DECADRON ) 4 MG tablet, 8 mg, Oral, Daily, 0 of 1 cycle, Start date: --, End date: -- palonosetron  (ALOXI ) injection 0.25 mg, 0.25 mg, Intravenous,  Once, 0 of 4 cycles oxaliplatin (ELOXATIN) 205 mg in dextrose  5 % 500 mL chemo infusion, 100 mg/m2 = 205 mg, Intravenous,  Once, 0 of 4 cycles gemcitabine (GEMZAR) 2,052 mg in sodium chloride  0.9 % 250 mL chemo infusion, 1,000 mg/m2 = 2,052 mg, Intravenous,  Once, 0 of 4 cycles  for chemotherapy treatment.      CHIEF COMPLIANT: Surveillance of history of lymphoma  HISTORY OF PRESENT ILLNESS:  History of Present Illness Michael Allen, a patient with a history of cancer currently in remission, reports no current health problems. He maintains an active lifestyle, including regular walking and yard work. His wife notes some concerns about his memory, which he has previously discussed with his primary care doctor and a neurologist. The possibility of "chemo fog" or long-term changes in memory due to chemotherapy was mentioned. The patient also has a small schwannoma, which may be affecting his hearing. The patient's wife expresses concern about this, as he has a  friend who had a larger schwannoma that required surgery.     ALLERGIES:  is allergic to penicillins, blood-group specific substance, oxycodone, shellfish allergy, and vitamin d analogs.  MEDICATIONS:   Current Outpatient Medications  Medication Sig Dispense Refill   Cholecalciferol (VITAMIN D3) 50 MCG (2000 UT) TABS Take by mouth.     metroNIDAZOLE  (METROGEL ) 0.75 % gel Apply 1 application topically 2 (two) times daily. 45 g 0   Vitamin D, Ergocalciferol, (DRISDOL) 1.25 MG (50000 UNIT) CAPS capsule Take 50,000 Units by mouth once a week. (Patient not taking: Reported on 07/08/2023)     No current facility-administered medications for this visit.    PHYSICAL EXAMINATION: ECOG PERFORMANCE STATUS: 1 - Symptomatic but completely ambulatory  There were no vitals filed for this visit. There were no vitals filed for this visit.  Physical Exam NECK: No lymphadenopathy. ABDOMEN: Liver and spleen normal.  (exam performed in the presence of a chaperone)  LABORATORY DATA:  I have reviewed the data as listed    Latest Ref Rng & Units 02/08/2023   10:56 AM 07/04/2021   11:05 AM 08/04/2020   11:40 AM  CMP  Glucose 70 - 99 mg/dL 83  98  95   BUN 8 - 23 mg/dL 18  14  15    Creatinine 0.61 - 1.24 mg/dL 7.82  9.56  2.13   Sodium 135 - 145 mmol/L 140  141  140   Potassium 3.5 - 5.1 mmol/L 4.2  4.0  4.4   Chloride 98 - 111 mmol/L 105  106  106   CO2 22 - 32 mmol/L 29  30  25    Calcium 8.9 - 10.3 mg/dL 9.5  9.2  9.3   Total Protein 6.5 - 8.1 g/dL 7.2  7.1  7.1   Total Bilirubin <1.2 mg/dL 0.6  0.5  0.7   Alkaline Phos 38 - 126 U/L 79  68  71   AST 15 - 41 U/L 24  23  26    ALT 0 - 44 U/L 14  13  16      Lab Results  Component Value Date   WBC 6.4 02/08/2023   HGB 14.5 02/08/2023   HCT 44.0 02/08/2023   MCV 92.8 02/08/2023   PLT 229 02/08/2023   NEUTROABS 3.2 02/08/2023    ASSESSMENT & PLAN:  Diffuse large cell non-Hodgkin's lymphoma (HCC) Jehovah's Witness status post left inguinal orchiectomy 12/20/2017 for a diffuse large cell non-Hodgkin's lymphoma, germinal center B-cell subtype, CD20 positive, CD5 negative.   (1) staging studies: (a) PET scan 12/31/2017: Uptake only in the  right testicle and panniculus, both of questionable significance (b) bone marrow biopsy 01/01/2018 shows no evidence of lymphoma (c) lumbar puncture 02/27/2018 benign  (2) international prognostic index: 2 (age, LDH 1 week post-op = 80% predicted PFS4; stage I   (3) adjuvant chemotherapy: R-CHOP completed 03/03/2018  4) adjuvant radiation 04/02/18-04/25/18  -------------------------------------------------------------------------------------------------------------------------------------- Surveillance: No clinical evidence of disease recurrence.  Monitoring LDH. 02/08/2021: MRI brain: Small left vestibular schwannoma decreased from 2020 02/09/2021: PET CT scan: Benign 02/06/2022: MRI brain: 8 mm vestibular schwannoma on the left (stable) 03/16/2022: PET/CT: Benign   02/08/2023: Brain MRI: Enhancing mass in the internal auditory canal of the left consistent with vestibular schwannoma smaller than previous (3 x 6 mm) this used to be 7 x 12 mm.   I discussed with the patient about following with neuro-oncology I did not recommend getting any more PET CT scan since he completed nearly 5 years of  follow-up.   Return to clinic in 1 year for follow-up ------------------------------------- Assessment and Plan Assessment & Plan Diffuse large B-cell lymphoma No recurrence symptoms. Liver, spleen normal. No lymphadenopathy. PET scans stopped after five years remission. - Monitor for symptoms such as weight loss, fatigue, or abdominal pain. - Schedule annual blood work next year.  Schwannoma Vestibular schwannoma reduced in size. Hearing impact unclear. Neurologist found no significant issues. - Refer to Dr. Vazlow, neuro-oncologist, for further evaluation.      Orders Placed This Encounter  Procedures   CBC with Differential (Cancer Center Only)    Standing Status:   Future    Expiration Date:   07/07/2024   CMP (Cancer Center only)    Standing Status:   Future    Expiration Date:    07/07/2024   Lactate dehydrogenase    Standing Status:   Future    Expiration Date:   07/07/2024   Amb Referral to Neuro Oncology    Referral Priority:   Routine    Referral Type:   Consultation    Referral Reason:   Specialty Services Required    Requested Specialty:   Oncology    Number of Visits Requested:   1   The patient has a good understanding of the overall plan. he agrees with it. he will call with any problems that may develop before the next visit here. Total time spent: 30 mins including face to face time and time spent for planning, charting and co-ordination of care   Margert Sheerer, MD 07/08/23

## 2023-07-10 ENCOUNTER — Other Ambulatory Visit: Payer: Self-pay

## 2023-07-23 ENCOUNTER — Inpatient Hospital Stay: Attending: Hematology and Oncology | Admitting: Internal Medicine

## 2023-07-23 VITALS — BP 103/65 | HR 79 | Temp 98.1°F | Resp 18 | Ht 74.0 in | Wt 192.6 lb

## 2023-07-23 DIAGNOSIS — Z8572 Personal history of non-Hodgkin lymphomas: Secondary | ICD-10-CM | POA: Insufficient documentation

## 2023-07-23 DIAGNOSIS — Z803 Family history of malignant neoplasm of breast: Secondary | ICD-10-CM | POA: Diagnosis not present

## 2023-07-23 DIAGNOSIS — D333 Benign neoplasm of cranial nerves: Secondary | ICD-10-CM | POA: Diagnosis not present

## 2023-07-23 NOTE — Progress Notes (Signed)
 [facility] at [facility] [address]  [place], [place] [address] [phone]   New Patient Evaluation  Date of Service: 07/23/23 Patient Name: Michael Allen Patient MRN: [id number] Patient DOB: Feb 20, 1946 Provider: Mamie Searles, MD  Identifying Statement:  Michael Allen is a 78 y.o. male with Vestibular schwannoma [facility]) who presents for initial consultation and evaluation regarding cancer associated neurologic deficits.    Referring Provider: Merl Star, MD [address] [place],  [place] [address]  Primary Cancer:  Oncologic History: Oncology History  Diffuse large cell non-Hodgkin's lymphoma (HCC)  12/27/2017 Initial Diagnosis   Diffuse large cell non-Hodgkin's lymphoma (HCC)   01/17/2018 - 03/06/2018 Chemotherapy   The patient had DOXOrubicin  (ADRIAMYCIN ) chemo injection 104 mg, 50 mg/m2 = 104 mg, Intravenous,  Once, 3 of 3 cycles Administration: 104 mg (01/17/2018), 104 mg (02/11/2018), 104 mg (03/03/2018) palonosetron  (ALOXI ) injection 0.25 mg, 0.25 mg, Intravenous,  Once, 3 of 3 cycles Administration: 0.25 mg (01/17/2018), 0.25 mg (02/11/2018), 0.25 mg (03/03/2018) pegfilgrastim -cbqv (UDENYCA ) injection 6 mg, 6 mg, Subcutaneous, Once, 3 of 3 cycles Administration: 6 mg (01/18/2018), 6 mg (02/13/2018), 6 mg (03/06/2018) vinCRIStine  (ONCOVIN ) 1 mg in sodium chloride  0.9 % 50 mL chemo infusion, 1 mg (100 % of original dose 1 mg), Intravenous,  Once, 3 of 3 cycles Dose modification: 1 mg (original dose 1 mg, Cycle 1, Reason: Provider Judgment) Administration: 1 mg (01/17/2018), 1 mg (02/11/2018), 1 mg (03/03/2018) riTUXimab  (RITUXAN ) 800 mg in sodium chloride  0.9 % 250 mL (2.4242 mg/mL) infusion, 375 mg/m2 = 800 mg, Intravenous,  Once, 2 of 2 cycles Administration: 800 mg (01/17/2018) cyclophosphamide  (CYTOXAN ) 1,560 mg in sodium chloride  0.9 % 250 mL chemo infusion, 750 mg/m2 = 1,560 mg, Intravenous,  Once, 3 of 3  cycles Administration: 1,560 mg (01/17/2018), 1,560 mg (02/11/2018), 1,560 mg (03/03/2018) riTUXimab  (RITUXAN ) 800 mg in sodium chloride  0.9 % 170 mL infusion, 375 mg/m2 = 800 mg, Intravenous,  Once, 2 of 2 cycles Administration: 800 mg (02/11/2018), 800 mg (03/03/2018) fosaprepitant  (EMEND ) 150 mg, dexamethasone  (DECADRON ) 12 mg in sodium chloride  0.9 % 145 mL IVPB, , Intravenous,  Once, 3 of 3 cycles Administration:  (01/17/2018),  (02/11/2018),  (03/03/2018)  for chemotherapy treatment.    11/18/2018 - 11/18/2018 Chemotherapy   Patient is on Treatment Plan : NON-HODGKINS LYMPHOMA Rituximab  q7d     11/18/2018 -  Chemotherapy   The patient had dexamethasone  (DECADRON ) 4 MG tablet, 8 mg, Oral, Daily, 0 of 1 cycle, Start date: --, End date: -- palonosetron  (ALOXI ) injection 0.25 mg, 0.25 mg, Intravenous,  Once, 0 of 4 cycles oxaliplatin (ELOXATIN) 205 mg in dextrose  5 % 500 mL chemo infusion, 100 mg/m2 = 205 mg, Intravenous,  Once, 0 of 4 cycles gemcitabine (GEMZAR) 2,052 mg in sodium chloride  0.9 % 250 mL chemo infusion, 1,000 mg/m2 = 2,052 mg, Intravenous,  Once, 0 of 4 cycles  for chemotherapy treatment.      History of Present Illness: The patient's records from the referring physician were obtained and reviewed and the patient interviewed to confirm this HPI.  Michael Allen presents to review abnormal brain MRI findings.  Lymphoma staging MRI from 2020 demonstrated incidental vestibular schwannoma.  This was following completing of chemotherapy in 2019.  Since that time, he complains of mild hearing impairment on the left side, not worsening over time.  Denies vertigo, facial weakness, double vision.  Remains active and independent otherwise.    Medications: Current Outpatient  Medications on File Prior to Visit  Medication Sig Dispense Refill   Cyanocobalamin  (VITAMIN B 12) 500 MCG TABS Take 2,500 mcg by mouth daily.     Cholecalciferol (VITAMIN D3) 50 MCG (2000 UT) TABS Take 50 mcg by mouth  daily.     metroNIDAZOLE  (METROGEL ) 0.75 % gel Apply 1 application topically 2 (two) times daily. 45 g 0   Vitamin D, Ergocalciferol, (DRISDOL) 1.25 MG (50000 UNIT) CAPS capsule Take 50,000 Units by mouth once a week. (Patient not taking: Reported on 02/13/2023)     No current facility-administered medications on file prior to visit.    Allergies:  Allergies  Allergen Reactions   Penicillins Other (See Comments) and Rash    Did it involve swelling of the face/tongue/throat, SOB, or low BP? Yes Did it involve sudden or severe rash/hives, skin peeling, or any reaction on the inside of your mouth or nose? Yes Did you need to seek medical attention at a hospital or doctor's office? Yes When did it last happen?  2005     If all above answers are "NO", may proceed with cephalosporin use.    Blood-Group Specific Substance     NO BLOOD PRODUCTS    Oxycodone Other (See Comments)    Made pt "looney" per wife    Shellfish Allergy Hives    Shell fish and shrimp   Vitamin D Analogs    Past Medical History:  Past Medical History:  Diagnosis Date   Chalazion    Dental crowns present    GERD (gastroesophageal reflux disease)    History of kidney stones 10/25/2017   right nonobstructing stone noted on CT ABD/Pelvis   Hydronephrosis 10/25/2017   Mild to moderate right due to 4mm stone right distal, noted on CT abd/pelvis   Hydroureter, right 10/25/2017   4mm stone right distal, noted on CT abd/pelvis   Impaired fasting glucose    Migraine    optical   Plantar fasciitis 2003   PONV (postoperative nausea and vomiting)    light   Pure hyperglyceridemia    Rectal bleeding 2002   Recurrent spontaneous pneumothorax    due to blebsx2   Skin cancer of scalp 2011   Transfusion of blood product refused for religious reason    Ureteral stone 10/25/2017   4mm stone right distal, noted on CT abd/pelvis   Wears glasses    Past Surgical History:  Past Surgical History:  Procedure Laterality Date    CHALAZION EXCISION Right    cyst removal , right eye   COLONOSCOPY     HEMORRHOID SURGERY  2002   INGUINAL HERNIA REPAIR Bilateral 2005   wire mesh   LAPAROSCOPIC REMOVAL ABDOMINAL MASS N/A 11/18/2018   Procedure: LAPAROSCOPIC EXPLORATION FOR MESENTERIC LYMPH NODE;  Surgeon: Juanita Norlander, MD;  Location: [facility];  Service: General;  Laterality: N/A;   LUNG SURGERY     x2   ORCHIECTOMY Left 12/20/2017   Procedure: ORCHIECTOMY;  Surgeon: Trent Frizzle, MD;  Location: [facility];  Service: Urology;  Laterality: Left;   TONSILLECTOMY     Social History:  Social History   Socioeconomic History   Marital status: Married    Spouse name: Not on file   Number of children: 2   Years of education: 16   Highest education level: Not on file  Occupational History   Not on file  Tobacco Use   Smoking status: Former   Smokeless tobacco: Never   Tobacco comments:  age 79  Vaping Use   Vaping status: Never Used  Substance and Sexual Activity   Alcohol use: Yes    Comment: occ beer   Drug use: Never   Sexual activity: Not on file  Other Topics Concern   Not on file  Social History Narrative   Right handed   Drinks caffeine    Two story home   Social Drivers of Health   Financial Resource Strain: Not on file  Food Insecurity: Low Risk  (09/04/2022)   Received from [facility]   Hunger Vital Sign    Worried About Running Out of Food in the Last Year: Never true    Ran Out of Food in the Last Year: Never true  Transportation Needs: Not on file (09/04/2022)  Physical Activity: Not on file  Stress: Not on file  Social Connections: Not on file  Intimate Partner Violence: Not on file   Family History:  Family History  Problem Relation Age of Onset   Dementia Mother        progressive   Breast cancer Mother    Heart Problems Father        pacemaker   CAD Father    Heart disease Father     Review of Systems: Constitutional: Doesn't report fevers,  chills or abnormal weight loss Eyes: Doesn't report blurriness of vision Ears, nose, mouth, throat, and face: Doesn't report sore throat Respiratory: Doesn't report cough, dyspnea or wheezes Cardiovascular: Doesn't report palpitation, chest discomfort  Gastrointestinal:  Doesn't report nausea, constipation, diarrhea GU: Doesn't report incontinence Skin: Doesn't report skin rashes Neurological: Per HPI Musculoskeletal: Doesn't report joint pain Behavioral/Psych: Doesn't report anxiety  Physical Exam: Vitals:   07/23/23 1509  BP: 103/65  Pulse: 79  Resp: 18  Temp: 98.1 F (36.7 C)  SpO2: 98%   KPS: 90. General: Alert, cooperative, pleasant, in no acute distress Head: Normal EENT: No conjunctival injection or scleral icterus.  Lungs: Resp effort normal Cardiac: Regular rate Abdomen: Non-distended abdomen Skin: No rashes cyanosis or petechiae. Extremities: No clubbing or edema  Neurologic Exam: Mental Status: Awake, alert, attentive to examiner. Oriented to self and environment. Language is fluent with intact comprehension.  Cranial Nerves: Visual acuity is grossly normal. Visual fields are full. Extra-ocular movements intact. No ptosis. Face is symmetric Motor: Tone and bulk are normal. Power is full in both arms and legs. Reflexes are symmetric, no pathologic reflexes present.  Sensory: Intact to light touch Gait: Normal.   Labs: I have reviewed the data as listed    Component Value Date/Time   NA 140 02/08/2023 1056   K 4.2 02/08/2023 1056   CL 105 02/08/2023 1056   CO2 29 02/08/2023 1056   GLUCOSE 83 02/08/2023 1056   BUN 18 02/08/2023 1056   CREATININE 0.77 02/08/2023 1056   CALCIUM 9.5 02/08/2023 1056   PROT 7.2 02/08/2023 1056   ALBUMIN 4.3 02/08/2023 1056   AST 24 02/08/2023 1056   ALT 14 02/08/2023 1056   ALKPHOS 79 02/08/2023 1056   BILITOT 0.6 02/08/2023 1056   GFRNONAA >60 02/08/2023 1056   GFRAA >60 07/07/2019 1409   GFRAA >60 03/11/2018 1205    Lab Results  Component Value Date   WBC 6.4 02/08/2023   NEUTROABS 3.2 02/08/2023   HGB 14.5 02/08/2023   HCT 44.0 02/08/2023   MCV 92.8 02/08/2023   PLT 229 02/08/2023    Imaging: CLINICAL DATA:  Brain/CNS neoplasm.  Schwannoma.  Follow-up.   EXAM: MRI HEAD  WITHOUT AND WITH CONTRAST   TECHNIQUE: Multiplanar, multiecho pulse sequences of the brain and surrounding structures were obtained without and with intravenous contrast.   CONTRAST:  8mL GADAVIST  GADOBUTROL  1 MMOL/ML IV SOLN   COMPARISON:  02/05/2022.  02/06/2021.   FINDINGS: Brain: No acute intracranial finding. Age related volume loss without subjective lobar predominance. No focal abnormality affects the brainstem or cerebellum. Enhancing mass within the internal auditory canal on the left consistent with vestibular schwannoma. This appears smaller than on the previous study, maximal measurement today 3 x 6 mm. No lesion on the right. Cerebral hemispheres show low moderate chronic small-vessel ischemic changes of the white matter, mildly progressive over time when compared to the examination as distant as August 2020. No evidence of large vessel stroke, intra-axial mass, hemorrhage, hydrocephalus or extra-axial collection. No other abnormal enhancement occurs.   Vascular: Major vessels at the base of the brain show flow.   Skull and upper cervical spine: Negative   Sinuses/Orbits: Clear/normal   Other: None   IMPRESSION: 1. Enhancing mass within the internal auditory canal on the left consistent with vestibular schwannoma. This appears smaller than on the previous study, maximal measurement today 3 x 6 mm. Maximal size seen at the time of presentation in August of 2020 showed measurements of 7 x 12 mm. 2. Low moderate chronic small-vessel ischemic changes of the cerebral hemispheric white matter, mildly progressive over time when compared to the examination as distant as August 2020.      Electronically Signed   By: Bettylou Brunner M.D.   On: 02/23/2023 09:51   Assessment/Plan Vestibular schwannoma [facility])  Michael Allen presents with clinical and radiographic syndrome c/w mild hearing impairment secondary to left vestibular schwannoma.  Audiology testing performed recently supported these findings.  His hearing impairment is not worsening subjectively in recent months/years.   Notably, the tumor itself is small in dimension compared to 2022, 2020.  This might represent a delayed effect of prior chemotherapy, which was administered in 2019.    Recommended continued annual imaging and audiology, pending worsening symptoms.  He is agreeable with this.  We spent twenty additional minutes teaching regarding the natural history, biology, and historical experience in the treatment of neurologic complications of cancer.   We appreciate the opportunity to participate in the care of Michael Allen.   We ask that Michael Allen return to clinic in 6 months following next brain MRI, or sooner as needed.  All questions were answered. The patient knows to call the clinic with any problems, questions or concerns. No barriers to learning were detected.  The total time spent in the encounter was 40 minutes and more than 50% was on counseling and review of test results   Mamie Searles, MD Medical Director of Neuro-Oncology [facility] at [place] 07/23/23 3:51 PM

## 2023-07-24 ENCOUNTER — Telehealth: Payer: Self-pay | Admitting: Internal Medicine

## 2023-07-24 NOTE — Telephone Encounter (Signed)
 Scheduled patient's appts. Advised patient to contact us if rescheduling is needed. Provided my direct line.

## 2023-08-14 ENCOUNTER — Ambulatory Visit: Payer: Medicare Other | Admitting: Physician Assistant

## 2023-08-19 ENCOUNTER — Other Ambulatory Visit: Payer: Self-pay

## 2023-11-18 DIAGNOSIS — H35373 Puckering of macula, bilateral: Secondary | ICD-10-CM | POA: Diagnosis not present

## 2023-11-18 DIAGNOSIS — H2513 Age-related nuclear cataract, bilateral: Secondary | ICD-10-CM | POA: Diagnosis not present

## 2023-11-18 DIAGNOSIS — H52203 Unspecified astigmatism, bilateral: Secondary | ICD-10-CM | POA: Diagnosis not present

## 2023-12-23 DIAGNOSIS — E7800 Pure hypercholesterolemia, unspecified: Secondary | ICD-10-CM | POA: Diagnosis not present

## 2023-12-23 DIAGNOSIS — Z5181 Encounter for therapeutic drug level monitoring: Secondary | ICD-10-CM | POA: Diagnosis not present

## 2023-12-23 DIAGNOSIS — Z0000 Encounter for general adult medical examination without abnormal findings: Secondary | ICD-10-CM | POA: Diagnosis not present

## 2023-12-23 DIAGNOSIS — C8330 Diffuse large B-cell lymphoma, unspecified site: Secondary | ICD-10-CM | POA: Diagnosis not present

## 2023-12-23 DIAGNOSIS — D333 Benign neoplasm of cranial nerves: Secondary | ICD-10-CM | POA: Diagnosis not present

## 2023-12-23 DIAGNOSIS — R413 Other amnesia: Secondary | ICD-10-CM | POA: Diagnosis not present

## 2023-12-23 DIAGNOSIS — Z23 Encounter for immunization: Secondary | ICD-10-CM | POA: Diagnosis not present

## 2023-12-23 DIAGNOSIS — I700 Atherosclerosis of aorta: Secondary | ICD-10-CM | POA: Diagnosis not present

## 2023-12-23 DIAGNOSIS — H9190 Unspecified hearing loss, unspecified ear: Secondary | ICD-10-CM | POA: Diagnosis not present

## 2024-01-29 ENCOUNTER — Institutional Professional Consult (permissible substitution): Payer: Medicare Other | Admitting: Psychology

## 2024-01-29 ENCOUNTER — Ambulatory Visit: Payer: Self-pay

## 2024-02-05 ENCOUNTER — Encounter: Payer: Medicare Other | Admitting: Psychology

## 2024-02-13 ENCOUNTER — Ambulatory Visit (HOSPITAL_COMMUNITY)
Admission: RE | Admit: 2024-02-13 | Discharge: 2024-02-13 | Disposition: A | Source: Ambulatory Visit | Attending: Internal Medicine

## 2024-02-13 DIAGNOSIS — D333 Benign neoplasm of cranial nerves: Secondary | ICD-10-CM | POA: Insufficient documentation

## 2024-02-13 MED ORDER — GADOBUTROL 1 MMOL/ML IV SOLN
8.0000 mL | Freq: Once | INTRAVENOUS | Status: AC | PRN
  Administered 2024-02-13: 8 mL via INTRAVENOUS

## 2024-02-18 ENCOUNTER — Ambulatory Visit: Admitting: Internal Medicine

## 2024-02-25 ENCOUNTER — Inpatient Hospital Stay: Attending: Internal Medicine | Admitting: Internal Medicine

## 2024-02-25 ENCOUNTER — Encounter: Payer: Self-pay | Admitting: Oncology

## 2024-02-25 VITALS — BP 132/72 | HR 88 | Temp 97.2°F | Resp 16 | Ht 74.0 in | Wt 190.0 lb

## 2024-02-25 DIAGNOSIS — Z9221 Personal history of antineoplastic chemotherapy: Secondary | ICD-10-CM | POA: Insufficient documentation

## 2024-02-25 DIAGNOSIS — D333 Benign neoplasm of cranial nerves: Secondary | ICD-10-CM | POA: Insufficient documentation

## 2024-02-25 DIAGNOSIS — C8330 Diffuse large B-cell lymphoma, unspecified site: Secondary | ICD-10-CM | POA: Diagnosis present

## 2024-02-25 DIAGNOSIS — Z87891 Personal history of nicotine dependence: Secondary | ICD-10-CM | POA: Diagnosis not present

## 2024-02-25 NOTE — Progress Notes (Signed)
 [facility] [address]  [place], [place] [address] [phone]   Interval Evaluation  Date of Service: 02/25/2024 Patient Name: Michael Allen Patient MRN: [id number] Patient DOB: Nov 29, 1945 Provider: Arthea MARLA Manns, MD  Identifying Statement:  Michael Allen is a 78 y.o. male with Vestibular schwannoma [facility])    Primary Cancer:  Oncologic History: Oncology History  Diffuse large cell non-Hodgkin's lymphoma (HCC)  12/27/2017 Initial Diagnosis   Diffuse large cell non-Hodgkin's lymphoma (HCC)   01/17/2018 - 03/06/2018 Chemotherapy   The patient had DOXOrubicin  (ADRIAMYCIN ) chemo injection 104 mg, 50 mg/m2 = 104 mg, Intravenous,  Once, 3 of 3 cycles Administration: 104 mg (01/17/2018), 104 mg (02/11/2018), 104 mg (03/03/2018) palonosetron  (ALOXI ) injection 0.25 mg, 0.25 mg, Intravenous,  Once, 3 of 3 cycles Administration: 0.25 mg (01/17/2018), 0.25 mg (02/11/2018), 0.25 mg (03/03/2018) pegfilgrastim -cbqv (UDENYCA ) injection 6 mg, 6 mg, Subcutaneous, Once, 3 of 3 cycles Administration: 6 mg (01/18/2018), 6 mg (02/13/2018), 6 mg (03/06/2018) vinCRIStine  (ONCOVIN ) 1 mg in sodium chloride  0.9 % 50 mL chemo infusion, 1 mg (100 % of original dose 1 mg), Intravenous,  Once, 3 of 3 cycles Dose modification: 1 mg (original dose 1 mg, Cycle 1, Reason: Provider Judgment) Administration: 1 mg (01/17/2018), 1 mg (02/11/2018), 1 mg (03/03/2018) riTUXimab  (RITUXAN ) 800 mg in sodium chloride  0.9 % 250 mL (2.4242 mg/mL) infusion, 375 mg/m2 = 800 mg, Intravenous,  Once, 2 of 2 cycles Administration: 800 mg (01/17/2018) cyclophosphamide  (CYTOXAN ) 1,560 mg in sodium chloride  0.9 % 250 mL chemo infusion, 750 mg/m2 = 1,560 mg, Intravenous,  Once, 3 of 3 cycles Administration: 1,560 mg (01/17/2018), 1,560 mg (02/11/2018), 1,560 mg (03/03/2018) riTUXimab  (RITUXAN ) 800 mg in sodium chloride  0.9 % 170 mL infusion, 375 mg/m2 = 800 mg, Intravenous,  Once, 2 of 2  cycles Administration: 800 mg (02/11/2018), 800 mg (03/03/2018) fosaprepitant  (EMEND ) 150 mg, dexamethasone  (DECADRON ) 12 mg in sodium chloride  0.9 % 145 mL IVPB, , Intravenous,  Once, 3 of 3 cycles Administration:  (01/17/2018),  (02/11/2018),  (03/03/2018)  for chemotherapy treatment.    11/18/2018 - 11/18/2018 Chemotherapy   Patient is on Treatment Plan : NON-HODGKINS LYMPHOMA Rituximab  q7d     11/18/2018 -  Chemotherapy   The patient had dexamethasone  (DECADRON ) 4 MG tablet, 8 mg, Oral, Daily, 0 of 1 cycle, Start date: --, End date: -- palonosetron  (ALOXI ) injection 0.25 mg, 0.25 mg, Intravenous,  Once, 0 of 4 cycles oxaliplatin (ELOXATIN) 205 mg in dextrose  5 % 500 mL chemo infusion, 100 mg/m2 = 205 mg, Intravenous,  Once, 0 of 4 cycles gemcitabine (GEMZAR) 2,052 mg in sodium chloride  0.9 % 250 mL chemo infusion, 1,000 mg/m2 = 2,052 mg, Intravenous,  Once, 0 of 4 cycles  for chemotherapy treatment.      Interval History: Michael Allen presents today for follow up after recent MRI brain study.  He denies any new or progressive changes.  Hearing loss is stable from prior.  No headaches, seizures.  H+P (07/23/23) Patient presents to review abnormal brain MRI findings.  Lymphoma staging MRI from 2020 demonstrated incidental vestibular schwannoma.  This was following completing of chemotherapy in 2019.  Since that time, he complains of mild hearing impairment on the left side, not worsening over time.  Denies vertigo, facial weakness, double vision.  Remains active and independent otherwise.    Medications: Current Outpatient Medications on File Prior to Visit  Medication Sig Dispense Refill   Cholecalciferol (VITAMIN D3) 50 MCG (  2000 UT) TABS Take 50 mcg by mouth daily.     Cyanocobalamin  (VITAMIN B 12) 500 MCG TABS Take 2,500 mcg by mouth daily.     metroNIDAZOLE  (METROGEL ) 0.75 % gel Apply 1 application topically 2 (two) times daily. 45 g 0   Vitamin D, Ergocalciferol, (DRISDOL) 1.25 MG (50000  UNIT) CAPS capsule Take 50,000 Units by mouth once a week. (Patient not taking: Reported on 02/13/2023)     No current facility-administered medications on file prior to visit.    Allergies:  Allergies  Allergen Reactions   Penicillins Other (See Comments) and Rash    Did it involve swelling of the face/tongue/throat, SOB, or low BP? Yes Did it involve sudden or severe rash/hives, skin peeling, or any reaction on the inside of your mouth or nose? Yes Did you need to seek medical attention at a hospital or doctor's office? Yes When did it last happen?  2005     If all above answers are NO, may proceed with cephalosporin use.    Blood-Group Specific Substance     NO BLOOD PRODUCTS    Oxycodone Other (See Comments)    Made pt looney per wife    Shellfish Allergy Hives    Shell fish and shrimp   Vitamin D Analogs    Past Medical History:  Past Medical History:  Diagnosis Date   Chalazion    Dental crowns present    GERD (gastroesophageal reflux disease)    History of kidney stones 10/25/2017   right nonobstructing stone noted on CT ABD/Pelvis   Hydronephrosis 10/25/2017   Mild to moderate right due to 4mm stone right distal, noted on CT abd/pelvis   Hydroureter, right 10/25/2017   4mm stone right distal, noted on CT abd/pelvis   Impaired fasting glucose    Migraine    optical   Plantar fasciitis 2003   PONV (postoperative nausea and vomiting)    light   Pure hyperglyceridemia    Rectal bleeding 2002   Recurrent spontaneous pneumothorax    due to blebsx2   Skin cancer of scalp 2011   Transfusion of blood product refused for religious reason    Ureteral stone 10/25/2017   4mm stone right distal, noted on CT abd/pelvis   Wears glasses    Past Surgical History:  Past Surgical History:  Procedure Laterality Date   CHALAZION EXCISION Right    cyst removal , right eye   COLONOSCOPY     HEMORRHOID SURGERY  2002   INGUINAL HERNIA REPAIR Bilateral 2005   wire mesh    LAPAROSCOPIC REMOVAL ABDOMINAL MASS N/A 11/18/2018   Procedure: LAPAROSCOPIC EXPLORATION FOR MESENTERIC LYMPH NODE;  Surgeon: Ethyl Lenis, MD;  Location: [facility];  Service: General;  Laterality: N/A;   LUNG SURGERY     x2   ORCHIECTOMY Left 12/20/2017   Procedure: ORCHIECTOMY;  Surgeon: Matilda Senior, MD;  Location: [facility];  Service: Urology;  Laterality: Left;   TONSILLECTOMY     Social History:  Social History   Socioeconomic History   Marital status: Married    Spouse name: Not on file   Number of children: 2   Years of education: 16   Highest education level: Not on file  Occupational History   Not on file  Tobacco Use   Smoking status: Former   Smokeless tobacco: Never   Tobacco comments:    age 72  Vaping Use   Vaping status: Never Used  Substance and Sexual Activity  Alcohol use: Yes    Comment: occ beer   Drug use: Never   Sexual activity: Not on file  Other Topics Concern   Not on file  Social History Narrative   Right handed   Drinks caffeine    Two story home   Social Drivers of Health   Tobacco Use: Medium Risk (02/13/2023)   Patient History    Smoking Tobacco Use: Former    Smokeless Tobacco Use: Never    Passive Exposure: Not on Actuary Strain: Not on file  Food Insecurity: Low Risk (09/04/2022)   Received from [facility]   Epic    Within the past 12 months, you worried that your food would run out before you got money to buy more: Never true    Within the past 12 months, the food you bought just didn't last and you didn't have money to get more. : Never true  Transportation Needs: Not on file (09/04/2022)  Physical Activity: Not on file  Stress: Not on file  Social Connections: Not on file  Intimate Partner Violence: Not on file  Depression (EYV7-0): Not on file  Alcohol Screen: Not on file  Housing: Low Risk (09/04/2022)   Received from [facility]   Epic    What is your living situation today?: I  have a steady place to live    Think about the place you live. Do you have problems with any of the following? Choose all that apply:: None/None on this list  Utilities: Low Risk (09/04/2022)   Received from [facility]   Utilities    In the past 12 months has the electric, gas, oil, or water company threatened to shut off services in your home? : No  Health Literacy: Not on file   Family History:  Family History  Problem Relation Age of Onset   Dementia Mother        progressive   Breast cancer Mother    Heart Problems Father        pacemaker   CAD Father    Heart disease Father     Review of Systems: Constitutional: Doesn't report fevers, chills or abnormal weight loss Eyes: Doesn't report blurriness of vision Ears, nose, mouth, throat, and face: Doesn't report sore throat Respiratory: Doesn't report cough, dyspnea or wheezes Cardiovascular: Doesn't report palpitation, chest discomfort  Gastrointestinal:  Doesn't report nausea, constipation, diarrhea GU: Doesn't report incontinence Skin: Doesn't report skin rashes Neurological: Per HPI Musculoskeletal: Doesn't report joint pain Behavioral/Psych: Doesn't report anxiety  Physical Exam: Vitals:   02/25/24 1235  BP: 132/72  Pulse: 88  Resp: 16  Temp: (!) 97.2 F (36.2 C)  SpO2: 100%    KPS: 90. General: Alert, cooperative, pleasant, in no acute distress Head: Normal EENT: No conjunctival injection or scleral icterus.  Lungs: Resp effort normal Cardiac: Regular rate Abdomen: Non-distended abdomen Skin: No rashes cyanosis or petechiae. Extremities: No clubbing or edema  Neurologic Exam: Mental Status: Awake, alert, attentive to examiner. Oriented to self and environment. Language is fluent with intact comprehension.  Cranial Nerves: Visual acuity is grossly normal. Visual fields are full. Extra-ocular movements intact. No ptosis. Face is symmetric Motor: Tone and bulk are normal. Power is full in both arms and  legs. Reflexes are symmetric, no pathologic reflexes present.  Sensory: Intact to light touch Gait: Normal.   Labs: I have reviewed the data as listed    Component Value Date/Time   NA 140 02/08/2023 1056  K 4.2 02/08/2023 1056   CL 105 02/08/2023 1056   CO2 29 02/08/2023 1056   GLUCOSE 83 02/08/2023 1056   BUN 18 02/08/2023 1056   CREATININE 0.77 02/08/2023 1056   CALCIUM 9.5 02/08/2023 1056   PROT 7.2 02/08/2023 1056   ALBUMIN 4.3 02/08/2023 1056   AST 24 02/08/2023 1056   ALT 14 02/08/2023 1056   ALKPHOS 79 02/08/2023 1056   BILITOT 0.6 02/08/2023 1056   GFRNONAA >60 02/08/2023 1056   GFRAA >60 07/07/2019 1409   GFRAA >60 03/11/2018 1205   Lab Results  Component Value Date   WBC 6.4 02/08/2023   NEUTROABS 3.2 02/08/2023   HGB 14.5 02/08/2023   HCT 44.0 02/08/2023   MCV 92.8 02/08/2023   PLT 229 02/08/2023   Imaging:  [facility] Clinician Interpretation: I have personally reviewed the CNS images as listed.  My interpretation, in the context of the patient's clinical presentation, is stable disease  MR BRAIN W WO CONTRAST Result Date: 02/13/2024 EXAM: MRI BRAIN WITH AND WITHOUT CONTRAST 02/13/2024 10:56:29 AM TECHNIQUE: Multiplanar multisequence MRI of the head/brain was performed with and without the administration of 8 mL of gadobutrol  (GADAVIST ) 1 MMOL/ML intravenous contrast. COMPARISON: MR Head without and with IV contrast 02/08/2023; MR Head without and with IV contrast 02/05/2022. CLINICAL HISTORY: 78 year old male with vestibular schwannoma and history of large B cell lymphoma. FINDINGS: BRAIN AND VENTRICLES: Brain volume has not significantly changed. Scattered mild for age cerebral white matter T2 and FLAIR hyperintensity is stable. No cortical encephalomalacia or chronic cerebral blood products. Mild T2 heterogeneity in the deep gray nuclei. Brainstem and cerebellum are within normal limits for age. Asymmetric soft tissue thickening in the left internal auditory canal  persists (series 10 image 7), however this is only minimally enhancing now (series 18 image 42), and has further regressed since last year. No new abnormal intracranial enhancement or dural thickening identified. No acute infarct. No acute intracranial hemorrhage. No mass effect or midline shift. No hydrocephalus. The sella is unremarkable. Normal flow voids. Following contrast, the major dural venous sinuses are enhancing and appear to be patent. ORBITS: No acute abnormality. SINUSES: Mastoids remain well pneumatized. Paranasal sinuses are well aerated. BONES AND SOFT TISSUES: Normal bone marrow signal and enhancement. No acute soft tissue abnormality. Normal for age visible cervical spine. IMPRESSION: 1. Chronic left vestibular schwannoma with further regression of enhancement since last year, only minimally enhancing now. 2. No metastatic disease or new intracranial abnormality. Electronically signed by: Helayne Hurst MD 02/13/2024 11:37 AM EST RP Workstation: [id number]    Assessment/Plan Vestibular schwannoma (HCC)  Michael Allen presents with clinical and radiographic syndrome c/w mild hearing impairment secondary to left vestibular schwannoma.  Audiology testing performed recently supported these findings.  His hearing impairment remains unchanged subjectively compared to last year.   Notably, the tumor itself is small in dimension compared to initial MRI in 2020.    Recommended continued annual imaging and audiology, pending worsening symptoms.  He is agreeable with this.  We appreciate the opportunity to participate in the care of Michael Allen.   We ask that Michael Allen return to clinic in 18 months following next brain MRI, or sooner as needed.  All questions were answered. The patient knows to call the clinic with any problems, questions or concerns. No barriers to learning were detected.  The total time spent in the encounter was 30 minutes and more than 50% was on counseling and  review of test results  Shalini Mair K Melessa Cowell, MD Medical Director of Neuro-Oncology [facility] 02/25/2024 12:29 PM

## 2024-07-09 ENCOUNTER — Inpatient Hospital Stay: Admitting: Hematology and Oncology

## 2024-07-09 ENCOUNTER — Inpatient Hospital Stay: Attending: Internal Medicine
